# Patient Record
Sex: Female | Born: 1975
Health system: Southern US, Community
[De-identification: ages and names within clinical notes are randomized; demographics above are authoritative.]

## PROBLEM LIST (undated history)

## (undated) DIAGNOSIS — D219 Benign neoplasm of connective and other soft tissue, unspecified: Secondary | ICD-10-CM

## (undated) DIAGNOSIS — N809 Endometriosis, unspecified: Secondary | ICD-10-CM

## (undated) DIAGNOSIS — Z8616 Personal history of COVID-19: Secondary | ICD-10-CM

## (undated) DIAGNOSIS — D869 Sarcoidosis, unspecified: Secondary | ICD-10-CM

## (undated) DIAGNOSIS — E785 Hyperlipidemia, unspecified: Secondary | ICD-10-CM

## (undated) DIAGNOSIS — D509 Iron deficiency anemia, unspecified: Secondary | ICD-10-CM

## (undated) HISTORY — DX: Personal history of COVID-19: Z86.16

## (undated) HISTORY — DX: Hyperlipidemia, unspecified: E78.5

## (undated) HISTORY — DX: Endometriosis, unspecified: N80.9

## (undated) HISTORY — DX: Benign neoplasm of connective and other soft tissue, unspecified: D21.9

## (undated) HISTORY — PX: TUBAL LIGATION: SHX77

---

## 2001-07-30 HISTORY — PX: LAPAROTOMY: SHX154

## 2005-05-03 ENCOUNTER — Emergency Department (HOSPITAL_COMMUNITY): Admission: EM | Admit: 2005-05-03 | Discharge: 2005-05-03 | Payer: Self-pay | Admitting: Family Medicine

## 2006-04-08 ENCOUNTER — Emergency Department (HOSPITAL_COMMUNITY): Admission: EM | Admit: 2006-04-08 | Discharge: 2006-04-08 | Payer: Self-pay | Admitting: Emergency Medicine

## 2006-06-06 ENCOUNTER — Emergency Department (HOSPITAL_COMMUNITY): Admission: EM | Admit: 2006-06-06 | Discharge: 2006-06-06 | Payer: Self-pay | Admitting: Emergency Medicine

## 2006-08-14 ENCOUNTER — Ambulatory Visit (HOSPITAL_COMMUNITY): Admission: RE | Admit: 2006-08-14 | Discharge: 2006-08-14 | Payer: Self-pay | Admitting: Obstetrics & Gynecology

## 2008-02-10 ENCOUNTER — Ambulatory Visit (HOSPITAL_COMMUNITY): Admission: RE | Admit: 2008-02-10 | Discharge: 2008-02-10 | Payer: Self-pay | Admitting: Obstetrics & Gynecology

## 2008-11-30 ENCOUNTER — Emergency Department (HOSPITAL_COMMUNITY): Admission: EM | Admit: 2008-11-30 | Discharge: 2008-11-30 | Payer: Self-pay | Admitting: Emergency Medicine

## 2008-12-29 ENCOUNTER — Emergency Department (HOSPITAL_COMMUNITY): Admission: EM | Admit: 2008-12-29 | Discharge: 2008-12-29 | Payer: Self-pay | Admitting: Emergency Medicine

## 2009-05-26 ENCOUNTER — Emergency Department (HOSPITAL_COMMUNITY): Admission: EM | Admit: 2009-05-26 | Discharge: 2009-05-26 | Payer: Self-pay | Admitting: Emergency Medicine

## 2009-08-18 ENCOUNTER — Emergency Department (HOSPITAL_COMMUNITY): Admission: EM | Admit: 2009-08-18 | Discharge: 2009-08-18 | Payer: Self-pay | Admitting: Emergency Medicine

## 2009-08-31 ENCOUNTER — Emergency Department (HOSPITAL_COMMUNITY): Admission: EM | Admit: 2009-08-31 | Discharge: 2009-08-31 | Payer: Self-pay | Admitting: Emergency Medicine

## 2009-09-14 ENCOUNTER — Emergency Department (HOSPITAL_COMMUNITY): Admission: EM | Admit: 2009-09-14 | Discharge: 2009-09-14 | Payer: Self-pay | Admitting: Emergency Medicine

## 2009-09-20 ENCOUNTER — Emergency Department (HOSPITAL_COMMUNITY): Admission: EM | Admit: 2009-09-20 | Discharge: 2009-09-20 | Payer: Self-pay | Admitting: Emergency Medicine

## 2010-08-20 ENCOUNTER — Encounter: Payer: Self-pay | Admitting: Obstetrics & Gynecology

## 2010-10-18 LAB — POCT I-STAT, CHEM 8
BUN: 13 mg/dL (ref 6–23)
Calcium, Ion: 1.07 mmol/L — ABNORMAL LOW (ref 1.12–1.32)
Chloride: 106 meq/L (ref 96–112)
Creatinine, Ser: 0.8 mg/dL (ref 0.4–1.2)
Glucose, Bld: 106 mg/dL — ABNORMAL HIGH (ref 70–99)
HCT: 36 % (ref 36.0–46.0)
Hemoglobin: 12.2 g/dL (ref 12.0–15.0)
Potassium: 3.5 meq/L (ref 3.5–5.1)
Sodium: 137 meq/L (ref 135–145)
TCO2: 26 mmol/L (ref 0–100)

## 2010-11-06 LAB — DIFFERENTIAL
Basophils Absolute: 0 10*3/uL (ref 0.0–0.1)
Basophils Relative: 1 % (ref 0–1)
Eosinophils Absolute: 0 10*3/uL (ref 0.0–0.7)
Monocytes Relative: 4 % (ref 3–12)
Neutro Abs: 5.4 10*3/uL (ref 1.7–7.7)

## 2010-11-06 LAB — WET PREP, GENITAL
Trich, Wet Prep: NONE SEEN
Yeast Wet Prep HPF POC: NONE SEEN

## 2010-11-06 LAB — BASIC METABOLIC PANEL
BUN: 11 mg/dL (ref 6–23)
CO2: 26 mEq/L (ref 19–32)
GFR calc Af Amer: >60 mL/min (ref >60)
GFR calc non Af Amer: >60 mL/min (ref >60)
Sodium: 139 mEq/L (ref 135–145)

## 2010-11-06 LAB — URINALYSIS, ROUTINE W REFLEX MICROSCOPIC
Nitrite: NEGATIVE
Specific Gravity, Urine: 1.025 (ref 1.005–1.030)
Urobilinogen, UA: 1 mg/dL (ref 0.0–1.0)

## 2010-11-06 LAB — CBC
Hemoglobin: 12.2 g/dL (ref 12.0–15.0)
MCV: 83.8 fL (ref 78.0–100.0)
RBC: 4.47 MIL/uL (ref 3.87–5.11)
WBC: 7.5 10*3/uL (ref 4.0–10.5)

## 2010-11-06 LAB — POCT PREGNANCY, URINE: Preg Test, Ur: NEGATIVE

## 2010-11-07 LAB — URINALYSIS, ROUTINE W REFLEX MICROSCOPIC
Bilirubin Urine: NEGATIVE
Ketones, ur: NEGATIVE mg/dL
Nitrite: NEGATIVE
Protein, ur: NEGATIVE mg/dL
Urobilinogen, UA: 1 mg/dL (ref 0.0–1.0)
pH: 7.5 (ref 5.0–8.0)

## 2010-11-07 LAB — BASIC METABOLIC PANEL
BUN: 10 mg/dL (ref 6–23)
Chloride: 109 mEq/L (ref 96–112)
Glucose, Bld: 101 mg/dL — ABNORMAL HIGH (ref 70–99)
Potassium: 3.7 mEq/L (ref 3.5–5.1)
Sodium: 138 mEq/L (ref 135–145)

## 2010-11-07 LAB — WET PREP, GENITAL
Clue Cells Wet Prep HPF POC: NONE SEEN
WBC, Wet Prep HPF POC: NONE SEEN
Yeast Wet Prep HPF POC: NONE SEEN

## 2010-11-07 LAB — CBC
HCT: 35 % — ABNORMAL LOW (ref 36.0–46.0)
Hemoglobin: 11.8 g/dL — ABNORMAL LOW (ref 12.0–15.0)
MCV: 84.3 fL (ref 78.0–100.0)
Platelets: 342 10*3/uL (ref 150–400)
WBC: 6 10*3/uL (ref 4.0–10.5)

## 2010-11-07 LAB — URINE MICROSCOPIC-ADD ON

## 2010-11-07 LAB — DIFFERENTIAL
Eosinophils Absolute: 0.4 10*3/uL (ref 0.0–0.7)
Eosinophils Relative: 6 % — ABNORMAL HIGH (ref 0–5)
Lymphs Abs: 1.7 10*3/uL (ref 0.7–4.0)
Monocytes Absolute: 0.2 10*3/uL (ref 0.1–1.0)

## 2010-11-07 LAB — POCT PREGNANCY, URINE: Preg Test, Ur: NEGATIVE

## 2010-11-07 LAB — GC/CHLAMYDIA PROBE AMP, GENITAL: GC Probe Amp, Genital: NEGATIVE

## 2011-03-08 ENCOUNTER — Emergency Department (HOSPITAL_COMMUNITY)
Admission: EM | Admit: 2011-03-08 | Discharge: 2011-03-09 | Disposition: A | Discharge status: Home / Self Care | Payer: Medicaid Other | Attending: Emergency Medicine | Admitting: Emergency Medicine

## 2011-03-08 DIAGNOSIS — R42 Dizziness and giddiness: Secondary | ICD-10-CM | POA: Insufficient documentation

## 2011-03-08 DIAGNOSIS — H9209 Otalgia, unspecified ear: Secondary | ICD-10-CM | POA: Insufficient documentation

## 2011-03-08 DIAGNOSIS — M25519 Pain in unspecified shoulder: Secondary | ICD-10-CM | POA: Insufficient documentation

## 2011-03-08 DIAGNOSIS — N390 Urinary tract infection, site not specified: Secondary | ICD-10-CM | POA: Insufficient documentation

## 2011-03-08 LAB — URINALYSIS, ROUTINE W REFLEX MICROSCOPIC
Glucose, UA: NEGATIVE mg/dL
Hgb urine dipstick: NEGATIVE
Specific Gravity, Urine: 1.015 (ref 1.005–1.030)
pH: 6.5 (ref 5.0–8.0)

## 2011-03-08 LAB — POCT I-STAT, CHEM 8
BUN: 13 mg/dL (ref 6–23)
Chloride: 102 mEq/L (ref 96–112)
Creatinine, Ser: 0.9 mg/dL (ref 0.50–1.10)
Glucose, Bld: 100 mg/dL — ABNORMAL HIGH (ref 70–99)
Potassium: 3.7 mEq/L (ref 3.5–5.1)

## 2011-03-08 LAB — URINE MICROSCOPIC-ADD ON

## 2011-03-10 LAB — URINE CULTURE

## 2011-03-28 ENCOUNTER — Other Ambulatory Visit: Payer: Self-pay | Admitting: Obstetrics & Gynecology

## 2011-03-28 DIAGNOSIS — Z79899 Other long term (current) drug therapy: Secondary | ICD-10-CM

## 2011-03-30 ENCOUNTER — Ambulatory Visit (HOSPITAL_COMMUNITY)
Admission: RE | Admit: 2011-03-30 | Discharge: 2011-03-30 | Disposition: A | Discharge status: Home / Self Care | Payer: Medicaid Other | Source: Ambulatory Visit | Attending: Obstetrics & Gynecology | Admitting: Obstetrics & Gynecology

## 2011-03-30 DIAGNOSIS — Z13820 Encounter for screening for osteoporosis: Secondary | ICD-10-CM | POA: Insufficient documentation

## 2011-03-30 DIAGNOSIS — Z79899 Other long term (current) drug therapy: Secondary | ICD-10-CM

## 2012-01-23 DIAGNOSIS — J189 Pneumonia, unspecified organism: Secondary | ICD-10-CM

## 2012-01-24 HISTORY — PX: NECK SURGERY: SHX720

## 2012-01-29 ENCOUNTER — Ambulatory Visit (INDEPENDENT_AMBULATORY_CARE_PROVIDER_SITE_OTHER): Payer: Self-pay | Admitting: Internal Medicine

## 2012-01-29 ENCOUNTER — Other Ambulatory Visit: Payer: Self-pay

## 2012-01-29 ENCOUNTER — Encounter: Payer: Self-pay | Admitting: Internal Medicine

## 2012-01-29 VITALS — BP 106/62 | HR 88 | Ht 65.0 in | Wt 119.6 lb

## 2012-01-29 DIAGNOSIS — R599 Enlarged lymph nodes, unspecified: Secondary | ICD-10-CM

## 2012-01-29 DIAGNOSIS — D869 Sarcoidosis, unspecified: Secondary | ICD-10-CM

## 2012-01-29 DIAGNOSIS — R590 Localized enlarged lymph nodes: Secondary | ICD-10-CM

## 2012-01-29 MED ORDER — PREDNISONE 20 MG PO TABS: ORAL_TABLET | ORAL | Status: DC

## 2012-01-29 NOTE — Progress Notes (Signed)
01/29/12- 35 yoF never smoker referred by Dr Cleone Slim for pulmonary evaluation with question of sarcoid.  In late June of 2013 she had onset of left flank pain for which she was evaluated at [facility]. CT scan of chest the 01/22/2012 showed focal airspace disease right upper lobe. Peri-bronchovascular nodular opacity was also seen at the left hilum and more confluent disease in the left lower lobe. There is widespread mediastinal adenopathy. Imaging of the abdomen demonstrated splenomegaly with miliary lesions in the liver and abdominal adenopathy. Differential diagnosis was between sarcoid and lymphoma. PPD skin test was negative. She was given a course of Cipro which is been completed. A right supraclavicular node biopsy was performed 01/23/2012 with pathology pending. She describes decreased pain in the left flank, itching in the eye, hair loss, 25 weight loss, mild sweating at night. Dry cough at the end of May and some swollen glands in her neck but not shortness of breath, fever, blood or purulent discharge. Past medical history has included endometriosis, pregnancy with 2 C-sections, tubal ligation and left oophorectomy. Elevated cholesterol. She denies drug history other than past use of marijuana. 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  Prior to Admission medications   Medication Sig Start Date End Date Taking? Authorizing Provider  ciprofloxacin (CIPRO) 250 MG tablet Take 250 mg by mouth 2 (two) times daily.   Yes Historical Provider, MD  predniSONE (DELTASONE) 20 MG tablet 3 daily x 1 week, then 2 daily or as directed 01/29/12 01/28/13  Waymon Budge, MD   Past Medical History  Diagnosis Date  . Hyperlipidemia   . Endometriosis    Past Surgical History  Procedure Date  . Gallbladder surgery 01-22-12  . Neck surgery 01-24-12    removed lymph node  . Tubal ligation 2003   Family History  Problem Relation Age of Onset  . Asthma Sister   . Asthma Brother   . Asthma  Father     childhood-smoker  . Diabetes Paternal Grandmother    History   Social History  . Marital Status: Single    Spouse Name: N/A    Number of Children: 2  . Years of Education: N/A   Occupational History  . Dental Assistant    Social History Main Topics  . Smoking status: Never Smoker   . Smokeless tobacco: Not on file  . Alcohol Use: No     Quit drinking 01-21-12  . Drug Use: No     Quit 07-19-11-"pot"  . Sexually Active: Not on file   Other Topics Concern  . Not on file   Social History Narrative  . No narrative on file   ROS-see HPI Constitutional:  +  weight loss,? night sweats,No- fevers, chills, fatigue, lassitude. HEENT:   No-  headaches, difficulty swallowing, tooth/dental problems, sore throat,       No-  sneezing, itching, ear ache, nasal congestion, post nasal drip,  CV:  No-   chest pain, orthopnea, PND, swelling in lower extremities, anasarca, dizziness, palpitations Resp: No-   shortness of breath with exertion or at rest.  + L flank pain            No-   productive cough,  + non-productive cough,  No- coughing up of blood.              No-   change in color of mucus.  No- wheezing.   Skin: No-   rash or lesions. GI:  No-  heartburn, indigestion, abdominal pain, nausea, vomiting, diarrhea,                 change in bowel habits, loss of appetite GU: No-   dysuria, change in color of urine, no urgency or frequency.  No- flank pain. MS:  No-   joint pain or swelling.  No- decreased range of motion.  No- back pain. Neuro-     nothing unusual Psych:  No- change in mood or affect. No depression or anxiety.  No memory loss.  OBJ- Physical Exam General- Alert, Oriented, Affect-appropriate, Distress- none acute. Slender, intelligent woman. Skin- rash-none, lesions- none, excoriation- none Lymphadenopathy- none Head- atraumatic            Eyes- Gross vision intact, PERRLA, conjunctivae and secretions clear            Ears- Hearing, canals-normal             Nose- Clear, no-Septal dev, mucus, polyps, erosion, perforation             Throat- Mallampati II , mucosa clear , drainage- none, tonsils- atrophic. Torus Neck- flexible , trachea midline, no stridor , thyroid nl, carotid no bruit Chest - symmetrical excursion , unlabored           Heart/CV- RRR , no murmur , no gallop  , no rub, nl s1 s2                           - JVD- none , edema- none, stasis changes- none, varices- none           Lung- clear to P&A, wheeze- none, cough- none , dullness-none, rub- none           Chest wall-  Abd- tender-no, distended-no, bowel sounds-present, HSM- not appreciated sitting upright for exam. Br/ Gen/ Rectal- Not done, not indicated Extrem- cyanosis- none, clubbing, none, atrophy- none, strength- nl Neuro- grossly intact to observation

## 2012-01-29 NOTE — Patient Instructions (Addendum)
Record release- [facility] 1) need radiology image disks for 01/22/12- CT chest/ abd/pelvis                                             2) pathology report for lymph node 01/23/12 (approx)                                             3) any incidental labs recently- chemistry, CBC  Order- ACE level- dx sarcoid  Script prednisone 20 mg    You will take 3 daily x 1 week, then 2 daily x 1 month or as directed

## 2012-01-30 LAB — ANGIOTENSIN CONVERTING ENZYME: Angiotensin-Converting Enzyme: >100 U/L — ABNORMAL HIGH (ref 8–52)

## 2012-01-30 NOTE — Progress Notes (Signed)
Quick Note:  Pt is aware of results and will start prednisone. ______

## 2012-02-01 ENCOUNTER — Encounter: Payer: Self-pay | Admitting: Internal Medicine

## 2012-02-01 DIAGNOSIS — D869 Sarcoidosis, unspecified: Secondary | ICD-10-CM | POA: Insufficient documentation

## 2012-02-01 NOTE — Assessment & Plan Note (Signed)
Her demographic category and absence of toxic systemic symptoms favor sarcoid. Her supraclavicular node biopsy should be definitive, when that result is available. We have discussed sarcoid in general terms. Plan-ACE level, liver and kidney functions. She is given standby prescription for prednisone tapering from 60 mg daily to 40 mg daily. We will advise whether to start this based on results of ACE level and pending availability of her pathology report. Steroid talk done.

## 2012-03-10 ENCOUNTER — Encounter: Payer: Self-pay | Admitting: Internal Medicine

## 2012-03-10 ENCOUNTER — Other Ambulatory Visit (INDEPENDENT_AMBULATORY_CARE_PROVIDER_SITE_OTHER): Payer: Medicaid Other

## 2012-03-10 ENCOUNTER — Ambulatory Visit (INDEPENDENT_AMBULATORY_CARE_PROVIDER_SITE_OTHER)
Admission: RE | Admit: 2012-03-10 | Discharge: 2012-03-10 | Disposition: A | Discharge status: Home / Self Care | Payer: Medicaid Other | Source: Ambulatory Visit | Attending: Internal Medicine | Admitting: Internal Medicine

## 2012-03-10 ENCOUNTER — Ambulatory Visit (INDEPENDENT_AMBULATORY_CARE_PROVIDER_SITE_OTHER): Payer: Medicaid Other | Admitting: Internal Medicine

## 2012-03-10 VITALS — BP 122/72 | HR 95 | Ht 65.0 in | Wt 135.6 lb

## 2012-03-10 DIAGNOSIS — D869 Sarcoidosis, unspecified: Secondary | ICD-10-CM

## 2012-03-10 LAB — BASIC METABOLIC PANEL
CO2: 31 mEq/L (ref 19–32)
Chloride: 101 mEq/L (ref 96–112)
Creatinine, Ser: 1 mg/dL (ref 0.4–1.2)
GFR: 77.73 mL/min (ref >60.00)
Sodium: 138 mEq/L (ref 135–145)

## 2012-03-10 MED ORDER — PREDNISONE 10 MG PO TABS: ORAL_TABLET | ORAL | Status: DC

## 2012-03-10 NOTE — Progress Notes (Signed)
01/29/12- 35 yoF never smoker referred by Dr Cleone Slim for pulmonary evaluation with question of sarcoid.  In late June of 2013 she had onset of left flank pain for which she was evaluated at [facility]. CT scan of chest the 01/22/2012 showed focal airspace disease right upper lobe. Peri-bronchovascular nodular opacity was also seen at the left hilum and more confluent disease in the left lower lobe. There is widespread mediastinal adenopathy. Imaging of the abdomen demonstrated splenomegaly with miliary lesions in the liver and abdominal adenopathy. Differential diagnosis was between sarcoid and lymphoma. PPD skin test was negative. She was given a course of Cipro which is been completed. A right supraclavicular node biopsy was performed 01/23/2012 with pathology pending. She describes decreased pain in the left flank, itching in the eye, hair loss, 25 weight loss, mild sweating at night. Dry cough at the end of May and some swollen glands in her neck but not shortness of breath, fever, blood or purulent discharge. Past medical history has included endometriosis, pregnancy with 2 C-sections, tubal ligation and left oophorectomy. Elevated cholesterol. She denies drug history other than past use of marijuana. 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  03/10/12- 35 yoF followed for sarcoid Has had break outs on face and hair falling out since being on prednisone-also only getting aproximately 2-3 hours sleep at night-depression . Started prednisone at last visit 60 mg daily x7 days then 40 mg daily . We discussed steroid goals and side effects again. ACE >100 01/30/12 Biopsy right cervical node 01/23/2012/[facility]- none necrotizing granulomatous inflammation negative for organisms.  ROS-see HPI Constitutional:  +  weight loss,? night sweats,No- fevers, chills, fatigue, lassitude. HEENT:   No-  headaches, difficulty swallowing, tooth/dental problems, sore throat,       No-  sneezing,  itching, ear ache, nasal congestion, post nasal drip,  CV:  No-   chest pain, orthopnea, PND, swelling in lower extremities, anasarca, dizziness, palpitations Resp: No-   shortness of breath with exertion or at rest.            No-   productive cough,  + less non-productive cough,  No- coughing up of blood.              No-   change in color of mucus.  No- wheezing.   Skin: No-   rash or lesions. GI:  No-   heartburn, indigestion, abdominal pain, nausea, vomiting,  GU:  MS:  No-   joint pain or swelling. . Neuro-     nothing unusual Psych:  +change in mood or affect. + depression or anxiety.  No memory loss.  OBJ- Physical Exam General- Alert, Oriented, Affect-appropriate, Distress- none acute. Slender, intelligent woman. Skin- facial acne Lymphadenopathy- none Head- atraumatic            Eyes- Gross vision intact, PERRLA, conjunctivae and secretions clear            Ears- Hearing, canals-normal            Nose- Clear, no-Septal dev, mucus, polyps, erosion, perforation             Throat- Mallampati II , mucosa clear , drainage- none, tonsils- atrophic. Torus Neck- flexible , trachea midline, no stridor , thyroid nl, carotid no bruit Chest - symmetrical excursion , unlabored           Heart/CV- RRR , no murmur , no gallop  , no rub, nl s1 s2                           -  JVD- none , edema- none, stasis changes- none, varices- none           Lung- clear to P&A, wheeze- none, cough- none , dullness-none, rub- none           Chest wall-  Abd-  Br/ Gen/ Rectal- Not done, not indicated Extrem- cyanosis- none, clubbing, none, atrophy- none, strength- nl Neuro- grossly intact to observation     

## 2012-03-10 NOTE — Patient Instructions (Addendum)
Order- lab- ACE level, BMET    Dx Sarcoid              CXR- dx sarcoid  Please call as needed

## 2012-03-11 LAB — ANGIOTENSIN CONVERTING ENZYME: Angiotensin-Converting Enzyme: 28 U/L (ref 8–52)

## 2012-03-11 NOTE — Progress Notes (Signed)
Quick Note:  LMTCB ______ 

## 2012-03-13 ENCOUNTER — Telehealth: Payer: Self-pay | Admitting: Internal Medicine

## 2012-03-13 NOTE — Telephone Encounter (Signed)
Notes Recorded by Waymon Budge, MD on 03/10/2012 at 8:57 PM Lymph nodes and lung tissue changes consistent with known sarcoid. Mildly improved. Notes Recorded by Waymon Budge, MD on 03/11/2012 at 12:07 PM ACE level normal- much improved after having been on the prednisone. Plan to continue with low dose prednisone as discussed at ov.  [id number] to discuss results as above. Carron Curie, CMA

## 2012-03-14 NOTE — Telephone Encounter (Signed)
LMTCBx2. Chellsea Beckers, CMA  

## 2012-03-15 NOTE — Assessment & Plan Note (Addendum)
Stage II sarcoid with mediastinal and abdominal adenopathy, some parenchymal involvement. She has put up with the predicted steroid side effects. Hopefully we can keep her stable and substantially lower dose. We had another discussion of long-term steroid side effects especially involving bone, adrenal axis and resistance to infection Plan-reduce steroids to 10 mg daily

## 2012-03-17 NOTE — Telephone Encounter (Signed)
Pt is aware of results already; please see results in EPIC.

## 2012-03-24 ENCOUNTER — Telehealth: Payer: Self-pay | Admitting: Internal Medicine

## 2012-03-24 NOTE — Telephone Encounter (Signed)
Pt states she never received lab and cxr results so I reviewed them with her again according to result notes. Pt states understanding.Sara Nelson, CMA

## 2012-04-28 ENCOUNTER — Ambulatory Visit: Payer: Medicaid Other | Admitting: Internal Medicine

## 2012-05-05 ENCOUNTER — Telehealth: Payer: Self-pay | Admitting: Internal Medicine

## 2012-05-05 MED ORDER — PREDNISONE 5 MG PO TABS: 5.0000 mg | ORAL_TABLET | Freq: Every day | ORAL | Status: DC

## 2012-05-05 NOTE — Telephone Encounter (Signed)
Called, spoke with pt.  We have scheduled her to see Dr. Maple Hudson on 05/19/12 at 11:15 am.  Pt aware and will call back if anything is needed prior to this or seek emergency care if needed.

## 2012-05-05 NOTE — Telephone Encounter (Signed)
1) spoke with pt and she stated she had missed her last appt but rescheduled to 06/23/12 w/ Dr. Maple Hudson. She is wanting to know if she still needs to be on prednisone--if so will need refill.   2) she has been getting "mesquito" like bumps all over her body x 2-3 weeks now. She knows these are not mesquito bites bc she has not been outside. They are very itchy/red. They are not painful. She only had 1 of these bumps that had pus in it a couple weeks ago but then went away after she squeezed the pus out. She has not tried anything OTC. She is requesting recs. Please advise thanks  Allergies  Allergen Reactions  . Ceftin (Cefuroxime Axetil) Shortness Of Breath

## 2012-05-05 NOTE — Telephone Encounter (Signed)
Recommend prednisone 5 mg daily till next ov.  Order prednisone 5 mg, # 30, ref x 1 Treat skin rash as acne- soap, water. If it stays in one patch, like shingles, I would like earlier ov so I can see it.

## 2012-05-05 NOTE — Telephone Encounter (Signed)
Called, spoke with pt.  Informed her of below per Dr. Maple Hudson.  She verbalized understanding and is aware prednisone 5 mg qd rx sent to [facility].  Dr. Maple Hudson, pt states she is already washing the rash bid with soap and water.  She was using dial soap.  Has now switched to Lever.  She is reqeusting an OV before Nov.  She can only come on Mondays.  Pls advise.  Thank you.

## 2012-05-05 NOTE — Telephone Encounter (Signed)
05-19-12 at 11:15am with CY; soonest available as pt can only come in on Mondays.

## 2012-05-19 ENCOUNTER — Ambulatory Visit (INDEPENDENT_AMBULATORY_CARE_PROVIDER_SITE_OTHER): Payer: Medicaid Other | Admitting: Internal Medicine

## 2012-05-19 ENCOUNTER — Other Ambulatory Visit (INDEPENDENT_AMBULATORY_CARE_PROVIDER_SITE_OTHER): Payer: Medicaid Other

## 2012-05-19 ENCOUNTER — Encounter: Payer: Self-pay | Admitting: Internal Medicine

## 2012-05-19 VITALS — BP 110/72 | HR 79 | Ht 65.0 in | Wt 148.0 lb

## 2012-05-19 DIAGNOSIS — D869 Sarcoidosis, unspecified: Secondary | ICD-10-CM

## 2012-05-19 DIAGNOSIS — R21 Rash and other nonspecific skin eruption: Secondary | ICD-10-CM

## 2012-05-19 DIAGNOSIS — G4700 Insomnia, unspecified: Secondary | ICD-10-CM

## 2012-05-19 LAB — BASIC METABOLIC PANEL
BUN: 16 mg/dL (ref 6–23)
Chloride: 108 mEq/L (ref 96–112)
Creatinine, Ser: 0.9 mg/dL (ref 0.4–1.2)

## 2012-05-19 MED ORDER — FLUCONAZOLE 150 MG PO TABS: 150.0000 mg | ORAL_TABLET | Freq: Once | ORAL | Status: DC

## 2012-05-19 MED ORDER — ZALEPLON 5 MG PO CAPS: ORAL_CAPSULE | ORAL | Status: DC

## 2012-05-19 NOTE — Patient Instructions (Addendum)
Script for sleep med  Script for diflucan tabs for yeast  Order- lab ACE level , BMET dx sarcoid  Consider trying otc caffeine caplets, like NoDoz- store brand is fine.   Consider otc

## 2012-05-19 NOTE — Progress Notes (Signed)
01/29/12- 35 yoF never smoker referred by Dr Cleone Slim for pulmonary evaluation with question of sarcoid.  In late June of 2013 she had onset of left flank pain for which she was evaluated at [facility]. CT scan of chest the 01/22/2012 showed focal airspace disease right upper lobe. Peri-bronchovascular nodular opacity was also seen at the left hilum and more confluent disease in the left lower lobe. There is widespread mediastinal adenopathy. Imaging of the abdomen demonstrated splenomegaly with miliary lesions in the liver and abdominal adenopathy. Differential diagnosis was between sarcoid and lymphoma. PPD skin test was negative. She was given a course of Cipro which is been completed. A right supraclavicular node biopsy was performed 01/23/2012 with pathology pending. She describes decreased pain in the left flank, itching in the eye, hair loss, 25 weight loss, mild sweating at night. Dry cough at the end of May and some swollen glands in her neck but not shortness of breath, fever, blood or purulent discharge. Past medical history has included endometriosis, pregnancy with 2 C-sections, tubal ligation and left oophorectomy. Elevated cholesterol. She denies drug history other than past use of marijuana. 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  03/10/12- 35 yoF followed for sarcoid Has had break outs on face and hair falling out since being on prednisone-also only getting aproximately 2-3 hours sleep at night-depression . Started prednisone at last visit 60 mg daily x7 days then 40 mg daily . We discussed steroid goals and side effects again. ACE >100 01/30/12 Biopsy right cervical node 01/23/2012/[facility]- none necrotizing granulomatous inflammation negative for organisms.  05/19/12- 35 yoF never smoker followed for sarcoid Continues prednisone 5 mg daily. Over the past month is noting small pruritic red spots which fade to brown- on extremities and trunk. Denies fever, sweats,  cough, swollen glands. Nose runs. Feels sleepy but has difficulty falling asleep. Discussed sleep hygiene. CXR 03/10/12-  IMPRESSION:  Right paratracheal and hilar adenopathy bilaterally. These  findings are compatible with sarcoid.  Airspace disease is present compatible with sarcoid. Mild  improvement on the right.  Original Report Authenticated By: Camelia Phenes, M.D.  ACE 28- 03/10/12  ROS-see HPI Constitutional:  +  weight loss,? night sweats,No- fevers, chills, fatigue, lassitude. HEENT:   No-  headaches, difficulty swallowing, tooth/dental problems, sore throat,       No-  sneezing, itching, ear ache, nasal congestion, post nasal drip,  CV:  No-   chest pain, orthopnea, PND, swelling in lower extremities, anasarca, dizziness, palpitations Resp: No-   shortness of breath with exertion or at rest.            No-   productive cough,  + less non-productive cough,  No- coughing up of blood.              No-   change in color of mucus.  No- wheezing.   Skin: + rash GI:  No-   heartburn, indigestion, abdominal pain, nausea, vomiting,  GU:  MS:  No-   joint pain or swelling. . Neuro-     nothing unusual Psych:  +change in mood or affect. + depression or anxiety.  No memory loss.  OBJ- Physical Exam General- Alert, Oriented, Affect-appropriate, Distress- none acute. Slender, intelligent woman. Skin- + small brown hyperpigmented nonraised spots Lymphadenopathy- none Head- atraumatic            Eyes- Gross vision intact, PERRLA, conjunctivae and secretions clear  Ears- Hearing, canals-normal            Nose- Clear, no-Septal dev, mucus, polyps, erosion, perforation             Throat- Mallampati II , mucosa clear , drainage- none, tonsils- atrophic. Torus Neck- flexible , trachea midline, no stridor , thyroid nl, carotid no bruit Chest - symmetrical excursion , unlabored           Heart/CV- RRR , no murmur , no gallop  , no rub, nl s1 s2                           - JVD- none  , edema- none, stasis changes- none, varices- none           Lung- clear to P&A, wheeze- none, cough- none , dullness-none, rub- none           Chest wall-  Abd-  Br/ Gen/ Rectal- Not done, not indicated Extrem- cyanosis- none, clubbing, none, atrophy- none, strength- nl Neuro- grossly intact to observation

## 2012-05-20 LAB — ANGIOTENSIN CONVERTING ENZYME: Angiotensin-Converting Enzyme: 67 U/L — ABNORMAL HIGH (ref 8–52)

## 2012-05-22 NOTE — Progress Notes (Signed)
Quick Note:  LMTCB ______ 

## 2012-05-27 ENCOUNTER — Encounter: Payer: Self-pay | Admitting: Internal Medicine

## 2012-05-27 DIAGNOSIS — G4700 Insomnia, unspecified: Secondary | ICD-10-CM | POA: Insufficient documentation

## 2012-05-27 DIAGNOSIS — L500 Allergic urticaria: Secondary | ICD-10-CM | POA: Insufficient documentation

## 2012-05-27 NOTE — Assessment & Plan Note (Signed)
Plan-update ACE level

## 2012-05-27 NOTE — Assessment & Plan Note (Addendum)
Plan-cortisone over-the-counter skin cream. Trial of Diflucan for yeast against the possibility that this is a Monilia from her steroids.

## 2012-05-27 NOTE — Assessment & Plan Note (Addendum)
Plan-Sonata, basic sleep hygiene discussion Okay to use occasional caffeine tablet in the daytime if needed to help regulate sleep rhythm.

## 2012-06-13 ENCOUNTER — Telehealth: Payer: Self-pay | Admitting: Internal Medicine

## 2012-06-13 MED ORDER — ZALEPLON 5 MG PO CAPS: ORAL_CAPSULE | ORAL | Status: DC

## 2012-06-13 NOTE — Telephone Encounter (Signed)
Called and lmom for the  pt  To make her  aware that this rx has been called to the pharmacy.

## 2012-06-13 NOTE — Telephone Encounter (Signed)
Per CY-okay to refill Soanta 5 mg #30 take 1-2 po qhs prn sleep with 1 refill.

## 2012-06-13 NOTE — Telephone Encounter (Signed)
I called the pt to speak with her regarding recent labs She verbalized understanding of these She states that she is needing a refill on sonata The rx that we gave her on 05/19/12 she lost Please advise if okay to send in rx- sonata 5 mg # 30 1-2 qhs with 1 rf

## 2012-06-13 NOTE — Progress Notes (Signed)
Quick Note:  Spoke with pt and notified of results per Dr. Young. Pt verbalized understanding and denied any questions.  ______ 

## 2012-06-23 ENCOUNTER — Ambulatory Visit: Payer: Medicaid Other | Admitting: Internal Medicine

## 2012-06-30 ENCOUNTER — Telehealth: Payer: Self-pay | Admitting: Internal Medicine

## 2012-06-30 MED ORDER — PREDNISONE 10 MG PO TABS: ORAL_TABLET | ORAL | Status: DC

## 2012-06-30 NOTE — Telephone Encounter (Signed)
Per CY-okay prednisone 20 mg qd x 1 week then alternate 10 mg with 20 mg every other day Ok to rx Prednisone 10 mg #100 2 daily or as directed no refills. Pt is aware of Rx sent to pharmacy.

## 2012-06-30 NOTE — Telephone Encounter (Signed)
Spoke with patient, patient states that x2weeks she has doubled her prednisone dose from 10mg  to 20mg  because she "could feel her spleen" and felt like she was starting to have another flare up.  Patient states that since doubling her prednisone she has felt much better.  Patient wants to know if it is ok for her to stay at 20mg  daily and if so she would need a refill for this, or if not what does Dr. Maple Hudson reccommend. Dr. Maple Hudson please advise, thank you!  Last OV:05/19/12 Next OV:08/11/12

## 2012-07-16 ENCOUNTER — Emergency Department (HOSPITAL_COMMUNITY): Payer: No Typology Code available for payment source

## 2012-07-16 ENCOUNTER — Encounter (HOSPITAL_COMMUNITY): Payer: Self-pay | Admitting: Emergency Medicine

## 2012-07-16 ENCOUNTER — Emergency Department (HOSPITAL_COMMUNITY)
Admission: EM | Admit: 2012-07-16 | Discharge: 2012-07-16 | Disposition: A | Discharge status: Home / Self Care | Payer: No Typology Code available for payment source | Attending: Emergency Medicine | Admitting: Emergency Medicine

## 2012-07-16 DIAGNOSIS — M542 Cervicalgia: Secondary | ICD-10-CM

## 2012-07-16 DIAGNOSIS — R51 Headache: Secondary | ICD-10-CM | POA: Insufficient documentation

## 2012-07-16 DIAGNOSIS — E785 Hyperlipidemia, unspecified: Secondary | ICD-10-CM | POA: Insufficient documentation

## 2012-07-16 DIAGNOSIS — Z8742 Personal history of other diseases of the female genital tract: Secondary | ICD-10-CM | POA: Insufficient documentation

## 2012-07-16 DIAGNOSIS — IMO0002 Reserved for concepts with insufficient information to code with codable children: Secondary | ICD-10-CM | POA: Insufficient documentation

## 2012-07-16 DIAGNOSIS — S0990XA Unspecified injury of head, initial encounter: Secondary | ICD-10-CM | POA: Insufficient documentation

## 2012-07-16 DIAGNOSIS — Y9389 Activity, other specified: Secondary | ICD-10-CM | POA: Insufficient documentation

## 2012-07-16 DIAGNOSIS — R6884 Jaw pain: Secondary | ICD-10-CM | POA: Insufficient documentation

## 2012-07-16 DIAGNOSIS — Y92410 Unspecified street and highway as the place of occurrence of the external cause: Secondary | ICD-10-CM | POA: Insufficient documentation

## 2012-07-16 DIAGNOSIS — G44309 Post-traumatic headache, unspecified, not intractable: Secondary | ICD-10-CM

## 2012-07-16 HISTORY — DX: Sarcoidosis, unspecified: D86.9

## 2012-07-16 MED ORDER — MELOXICAM 15 MG PO TABS: 15.0000 mg | ORAL_TABLET | Freq: Every day | ORAL | Status: DC

## 2012-07-16 MED ORDER — METHOCARBAMOL 750 MG PO TABS: 750.0000 mg | ORAL_TABLET | Freq: Four times a day (QID) | ORAL | Status: DC

## 2012-07-16 MED ORDER — OXYCODONE-ACETAMINOPHEN 5-325 MG PO TABS: 2.0000 | ORAL_TABLET | ORAL | Status: DC | PRN
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
## 2012-07-16 NOTE — ED Notes (Signed)
Patient transported to X-ray 

## 2012-07-16 NOTE — ED Notes (Signed)
Pt not in room.

## 2012-07-16 NOTE — ED Notes (Signed)
Pt returned to room from radiology. Triage initiated at this time. Triage completed at bedside. Pt on telephone with insurance company. Pt alert , oriented and conversive

## 2012-07-16 NOTE — ED Notes (Signed)
Patient transported to CT 

## 2012-07-16 NOTE — ED Notes (Signed)
Pt seen by PA. Orders received

## 2012-07-19 NOTE — ED Provider Notes (Signed)
Medical screening examination/treatment/procedure(s) were performed by non-physician practitioner and as supervising physician I was immediately available for consultation/collaboration.   Waver Dibiasio M Yashar Inclan, MD 07/19/12 0744 

## 2012-08-11 ENCOUNTER — Ambulatory Visit: Payer: Self-pay | Admitting: Internal Medicine

## 2012-09-02 ENCOUNTER — Encounter: Payer: Self-pay | Admitting: Internal Medicine

## 2012-09-02 ENCOUNTER — Ambulatory Visit (INDEPENDENT_AMBULATORY_CARE_PROVIDER_SITE_OTHER): Payer: Self-pay | Admitting: Internal Medicine

## 2012-09-02 VITALS — BP 122/80 | HR 89 | Ht 65.0 in | Wt 161.2 lb

## 2012-09-02 DIAGNOSIS — D869 Sarcoidosis, unspecified: Secondary | ICD-10-CM

## 2012-09-02 NOTE — Patient Instructions (Addendum)
Order- CXR    Dx sarcoid              Lab-    CBC, ACE, BMET, Liver panel  Ok to take daily Allegra for itching, and use soothing skin lotion  We will watch for now off prednisone to see what happens.
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
## 2012-09-11 NOTE — Assessment & Plan Note (Signed)
.   Side effects from prednisone with symptoms associated with sarcoid. We need to understand how active her sarcoid now is. Plan-chest x-ray, ACE, chemistry, CBC. Discussed Allegra as an antihistamine

## 2012-10-14 ENCOUNTER — Ambulatory Visit: Payer: Self-pay | Admitting: Internal Medicine

## 2012-10-14 ENCOUNTER — Telehealth: Payer: Self-pay | Admitting: Internal Medicine

## 2012-10-14 NOTE — Telephone Encounter (Signed)
ATC the pt, NA and mailbox was full so unable to leave a msg

## 2012-10-14 NOTE — Telephone Encounter (Signed)
We had suggested Sara Nelson- is she using it daily? Why did she cancel appointment for today?

## 2012-10-14 NOTE — Telephone Encounter (Signed)
I spoke with pt and she is r/s to come in 11/25/12. She stated her voice comes and goes at times, she is spitting up clear phlem, she bruises easy, waking up with crust all over her eyes, she has an itch all over her body. Pt had an appt today but had to cancel. Please advise Dr. Maple Hudson thanks  Last OV 08/2012.  Allergies  Allergen Reactions  . Ceftin (Cefuroxime Axetil) Shortness Of Breath

## 2012-10-16 NOTE — Telephone Encounter (Signed)
lmomtcb for the pt to call back  

## 2012-10-17 ENCOUNTER — Telehealth: Payer: Self-pay | Admitting: Internal Medicine

## 2012-10-17 NOTE — Telephone Encounter (Signed)
Call pt back on cell phone [phone] she will go back to work at 2:00 and will not answer her phone we need to understand this 

## 2012-10-17 NOTE — Telephone Encounter (Signed)
Call pt back on cell phone [phone] she will go back to work at 2:00 and will not answer her phone we need to understand this

## 2012-10-17 NOTE — Telephone Encounter (Signed)
Will try to call pt back after 5 pm today.

## 2012-10-17 NOTE — Telephone Encounter (Signed)
[id number] on only contact number in chart. Carron Curie, CMA

## 2012-10-17 NOTE — Telephone Encounter (Signed)
Will sign off of this message.  See other message from 3/18

## 2012-10-20 NOTE — Telephone Encounter (Signed)
ATC patient, no answer [email or web address] 

## 2012-10-21 NOTE — Telephone Encounter (Signed)
lmtcb x2 

## 2012-10-22 NOTE — Telephone Encounter (Signed)
lmomtcb  

## 2012-10-23 NOTE — Telephone Encounter (Signed)
Spoke with pt and she is states that itching is some better but is still having bumps all over skin.  C/o hoarseness, dry cough and occas SOB.   Pt is taking allegra daily.    Appt for 11-25-12 was rescheduled to 10-29-11.

## 2012-10-28 ENCOUNTER — Encounter: Payer: Self-pay | Admitting: Internal Medicine

## 2012-10-28 ENCOUNTER — Ambulatory Visit (INDEPENDENT_AMBULATORY_CARE_PROVIDER_SITE_OTHER): Payer: BC Managed Care – PPO | Admitting: Internal Medicine

## 2012-10-28 ENCOUNTER — Other Ambulatory Visit (INDEPENDENT_AMBULATORY_CARE_PROVIDER_SITE_OTHER): Payer: BC Managed Care – PPO

## 2012-10-28 ENCOUNTER — Ambulatory Visit (INDEPENDENT_AMBULATORY_CARE_PROVIDER_SITE_OTHER)
Admission: RE | Admit: 2012-10-28 | Discharge: 2012-10-28 | Disposition: A | Discharge status: Home / Self Care | Payer: BC Managed Care – PPO | Source: Ambulatory Visit | Attending: Internal Medicine | Admitting: Internal Medicine

## 2012-10-28 VITALS — BP 110/62 | HR 98 | Temp 97.8°F | Ht 65.0 in | Wt 159.2 lb

## 2012-10-28 DIAGNOSIS — D869 Sarcoidosis, unspecified: Secondary | ICD-10-CM

## 2012-10-28 DIAGNOSIS — J00 Acute nasopharyngitis [common cold]: Secondary | ICD-10-CM

## 2012-10-28 DIAGNOSIS — J069 Acute upper respiratory infection, unspecified: Secondary | ICD-10-CM

## 2012-10-28 LAB — HEPATIC FUNCTION PANEL
ALT: 14 U/L (ref 0–35)
AST: 23 U/L (ref 0–37)
Albumin: 3.4 g/dL — ABNORMAL LOW (ref 3.5–5.2)
Total Bilirubin: 0.7 mg/dL (ref 0.3–1.2)

## 2012-10-28 LAB — CBC WITH DIFFERENTIAL/PLATELET
Basophils Absolute: 0 10*3/uL (ref 0.0–0.1)
Eosinophils Absolute: 0.1 10*3/uL (ref 0.0–0.7)
Lymphocytes Relative: 21.3 % (ref 12.0–46.0)
MCHC: 32.5 g/dL (ref 30.0–36.0)
MCV: 76.8 fl — ABNORMAL LOW (ref 78.0–100.0)
Monocytes Absolute: 0.4 10*3/uL (ref 0.1–1.0)
Neutrophils Relative %: 68.9 % (ref 43.0–77.0)
Platelets: 388 10*3/uL (ref 150.0–400.0)
RBC: 4.81 Mil/uL (ref 3.87–5.11)

## 2012-10-28 LAB — BASIC METABOLIC PANEL
BUN: 9 mg/dL (ref 6–23)
Chloride: 102 mEq/L (ref 96–112)
Creatinine, Ser: 0.9 mg/dL (ref 0.4–1.2)
GFR: 95.15 mL/min (ref >60.00)
Glucose, Bld: 101 mg/dL — ABNORMAL HIGH (ref 70–99)
Potassium: 3.9 mEq/L (ref 3.5–5.1)

## 2012-10-28 LAB — SEDIMENTATION RATE: Sed Rate: 47 mm/hr — ABNORMAL HIGH (ref 0–22)

## 2012-10-28 MED ORDER — DOXYCYCLINE HYCLATE 100 MG PO TABS: ORAL_TABLET | ORAL | Status: DC

## 2012-10-28 MED ORDER — TRAMADOL HCL 50 MG PO TABS: ORAL_TABLET | ORAL | Status: DC

## 2012-10-28 NOTE — Progress Notes (Signed)
Quick Note:  Pt aware of results and Rx sent to [facility] [address] ______

## 2012-10-28 NOTE — Patient Instructions (Addendum)
Order- CXR   Dx sarcoid              Lab- BMET, Liver panel, Sed rate, ANA, ACE level, CBC  Script sent for tramadol for cough and pain  For now treat the chest congestion as a viral chest cold- fluids, rest, comfort measures and time
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
## 2012-10-29 LAB — ANA: Anti Nuclear Antibody(ANA): NEGATIVE

## 2012-10-30 ENCOUNTER — Telehealth: Payer: Self-pay | Admitting: Internal Medicine

## 2012-10-30 DIAGNOSIS — R161 Splenomegaly, not elsewhere classified: Secondary | ICD-10-CM

## 2012-10-30 MED ORDER — PREDNISONE 10 MG PO TABS: 10.0000 mg | ORAL_TABLET | Freq: Every day | ORAL | Status: DC

## 2012-10-30 NOTE — Telephone Encounter (Signed)
Per CY-start back prednisone 10 mg #30 take 1 po qd with 1 refill and order KUB xray dx  Of "splenomegaly".

## 2012-10-30 NOTE — Telephone Encounter (Signed)
Called and spoke with pt about her lab results per CY.   Pt has a couple of questions--  1.  Should she start back on the prednisone since her ACE level is high?  2.  She stated that she is not able to lay on her left side--she stated that she needs her spleen checked because it feels like it did before when it was enlarged.    CY please advise. Thanks  Last ov--10/28/2012 Next ov--12/23/2012  Allergies  Allergen Reactions  . Ceftin (Cefuroxime Axetil) Shortness Of Breath

## 2012-10-30 NOTE — Telephone Encounter (Signed)
Pt aware of CDY recs. RX has been sent. Nothing further was needed

## 2012-11-04 DIAGNOSIS — J069 Acute upper respiratory infection, unspecified: Secondary | ICD-10-CM | POA: Insufficient documentation

## 2012-11-04 DIAGNOSIS — J00 Acute nasopharyngitis [common cold]: Secondary | ICD-10-CM | POA: Insufficient documentation

## 2012-11-04 NOTE — Assessment & Plan Note (Signed)
Sarcoid is clinically active. Plan-chest x-ray, chemistry panel, sedimentation rate, ANA, CBC, ACE level

## 2012-11-11 ENCOUNTER — Telehealth: Payer: Self-pay | Admitting: Internal Medicine

## 2012-11-11 NOTE — Telephone Encounter (Signed)
Spoke with pt and she states the only time she can be seen is Tuesday, 11-18-12.  PT given appt with Tammy Parrett on this date.

## 2012-11-11 NOTE — Telephone Encounter (Signed)
Per CY-we can see if we can add patient to TP or have her see her PCP. Thanks.

## 2012-11-11 NOTE — Telephone Encounter (Signed)
Called, spoke with pt.  States she finished doxy rx.  Since this, her symptoms are better but still there.  Reports she has a prod cough with yellow mucus, wheezing"here and there but not as much," upper back pain, a "continuous boom" HA, feels like someone is "squeezing" her head, feels hot, and has night sweats.  Denies increased SOB, chest tightness, CP, fever, or PND.  Also reports feeling dizzy, having N/D, and scratchy throat since yesterday.  States she doesn't have any diarrhea but her BMs have been "nasty" since finishing doxy.  She is requesting either OV with CDY or recs from him.    Last OV with CDy 10/28/12; asked to f/u in 1 month Pending O 12/23/12  Allergies  Allergen Reactions  . Ceftin (Cefuroxime Axetil) Shortness Of Breath

## 2012-11-18 ENCOUNTER — Ambulatory Visit: Payer: BC Managed Care – PPO | Admitting: Adult Health

## 2012-11-20 ENCOUNTER — Encounter: Payer: Self-pay | Admitting: Obstetrics & Gynecology

## 2012-11-20 ENCOUNTER — Ambulatory Visit (INDEPENDENT_AMBULATORY_CARE_PROVIDER_SITE_OTHER): Payer: BC Managed Care – PPO | Admitting: Obstetrics & Gynecology

## 2012-11-20 VITALS — BP 113/79 | HR 80 | Temp 98.2°F | Ht 65.0 in | Wt 155.0 lb

## 2012-11-20 DIAGNOSIS — Z01419 Encounter for gynecological examination (general) (routine) without abnormal findings: Secondary | ICD-10-CM

## 2012-11-20 DIAGNOSIS — N760 Acute vaginitis: Secondary | ICD-10-CM

## 2012-11-20 DIAGNOSIS — N803 Endometriosis of pelvic peritoneum, unspecified: Secondary | ICD-10-CM

## 2012-11-20 DIAGNOSIS — Z3202 Encounter for pregnancy test, result negative: Secondary | ICD-10-CM

## 2012-11-20 LAB — POCT URINE PREGNANCY: Preg Test, Ur: NEGATIVE

## 2012-11-20 MED ORDER — TINIDAZOLE 500 MG PO TABS: 500.0000 mg | ORAL_TABLET | Freq: Two times a day (BID) | ORAL | Status: DC

## 2012-11-20 NOTE — Patient Instructions (Signed)
Bacterial Vaginosis Bacterial vaginosis (BV) is a vaginal infection where the normal balance of bacteria in the vagina is disrupted. The normal balance is then replaced by an overgrowth of certain bacteria. There are several different kinds of bacteria that can cause BV. BV is the most common vaginal infection in women of childbearing age. CAUSES   The cause of BV is not fully understood. BV develops when there is an increase or imbalance of harmful bacteria.  Some activities or behaviors can upset the normal balance of bacteria in the vagina and put women at increased risk including:  Having a new sex partner or multiple sex partners.  Douching.  Using an intrauterine device (IUD) for contraception.  It is not clear what role sexual activity plays in the development of BV. However, women that have never had sexual intercourse are rarely infected with BV. Women do not get BV from toilet seats, bedding, swimming pools or from touching objects around them.  SYMPTOMS   Grey vaginal discharge.  A fish-like odor with discharge, especially after sexual intercourse.  Itching or burning of the vagina and vulva.  Burning or pain with urination.  Some women have no signs or symptoms at all. DIAGNOSIS  Your caregiver must examine the vagina for signs of BV. Your caregiver will perform lab tests and look at the sample of vaginal fluid through a microscope. They will look for bacteria and abnormal cells (clue cells), a pH test higher than 4.5, and a positive amine test all associated with BV.  RISKS AND COMPLICATIONS   Pelvic inflammatory disease (PID).  Infections following gynecology surgery.  Developing HIV.  Developing herpes virus. TREATMENT  Sometimes BV will clear up without treatment. However, all women with symptoms of BV should be treated to avoid complications, especially if gynecology surgery is planned. Female partners generally do not need to be treated. However, BV may spread  between female sex partners so treatment is helpful in preventing a recurrence of BV.   BV may be treated with antibiotics. The antibiotics come in either pill or vaginal cream forms. Either can be used with nonpregnant or pregnant women, but the recommended dosages differ. These antibiotics are not harmful to the baby.  BV can recur after treatment. If this happens, a second round of antibiotics will often be prescribed.  Treatment is important for pregnant women. If not treated, BV can cause a premature delivery, especially for a pregnant woman who had a premature birth in the past. All pregnant women who have symptoms of BV should be checked and treated.  For chronic reoccurrence of BV, treatment with a type of prescribed gel vaginally twice a week is helpful. HOME CARE INSTRUCTIONS   Finish all medication as directed by your caregiver.  Do not have sex until treatment is completed.  Tell your sexual partner that you have a vaginal infection. They should see their caregiver and be treated if they have problems, such as a mild rash or itching.  Practice safe sex. Use condoms. Only have 1 sex partner. PREVENTION  Basic prevention steps can help reduce the risk of upsetting the natural balance of bacteria in the vagina and developing BV:  Do not have sexual intercourse (be abstinent).  Do not douche.  Use all of the medicine prescribed for treatment of BV, even if the signs and symptoms go away.  Tell your sex partner if you have BV. That way, they can be treated, if needed, to prevent reoccurrence. SEEK MEDICAL CARE IF:     Your symptoms are not improving after 3 days of treatment.  You have increased discharge, pain, or fever. MAKE SURE YOU:   Understand these instructions.  Will watch your condition.  Will get help right away if you are not doing well or get worse. FOR MORE INFORMATION  Division of STD Prevention (DSTDP), Centers for Disease Control and Prevention:  www.cdc.gov/std American Social Health Association (ASHA): www.ashastd.org  Document Released: 07/16/2005 Document Revised: 10/08/2011 Document Reviewed: 01/06/2009 ExitCare Patient Information 2013 [employer].  

## 2012-11-20 NOTE — Progress Notes (Signed)
.   Subjective:     Sara Nelson is a 37 y.o. female here for her annual exam.  Current complaints:  Slight vaginal odor.  Patient would like to discuss starting the Lupron again for her endometriosis.  Personal health questionnaire reviewed: no.   Gynecologic History Patient's last menstrual period was 11/08/2012. Contraception: tubal ligation Last Pap: 12/2010. Results were: HSIL.  LEEP done 02/2011. Last mammogram: N/A     The following portions of the patient's history were reviewed and updated as appropriate: allergies, current medications, past family history, past medical history, past social history, past surgical history and problem list.  Review of Systems Pertinent items are noted in HPI.    Objective:    General appearance: alert Breasts: normal appearance, no masses or tenderness Abdomen: soft, non-tender; bowel sounds normal; no masses,  no organomegaly Pelvic: cervix normal in appearance, external genitalia normal, no adnexal masses or tenderness, uterus normal size, shape, and consistency and vagina with thin, white discharge    Assessment:    H/O endometriosis--now w/worsening symptoms; no medical management at this point  Plan:     Course of [place] Lupron-->"remission" Return to re-start therapy

## 2012-11-21 ENCOUNTER — Encounter: Payer: Self-pay | Admitting: Obstetrics & Gynecology

## 2012-11-21 DIAGNOSIS — N803 Endometriosis of pelvic peritoneum, unspecified: Secondary | ICD-10-CM | POA: Insufficient documentation

## 2012-11-21 LAB — POCT WET PREP (WET MOUNT)

## 2012-11-21 LAB — RPR

## 2012-11-21 LAB — HIV ANTIBODY (ROUTINE TESTING W REFLEX): HIV: NONREACTIVE

## 2012-11-25 ENCOUNTER — Ambulatory Visit: Payer: Self-pay | Admitting: Internal Medicine

## 2012-11-25 LAB — PAP IG, CT-NG, RFX HPV ASCU

## 2012-12-10 ENCOUNTER — Encounter: Payer: Self-pay | Admitting: Obstetrics & Gynecology

## 2012-12-23 ENCOUNTER — Ambulatory Visit (INDEPENDENT_AMBULATORY_CARE_PROVIDER_SITE_OTHER)
Admission: RE | Admit: 2012-12-23 | Discharge: 2012-12-23 | Disposition: A | Discharge status: Home / Self Care | Payer: BC Managed Care – PPO | Source: Ambulatory Visit | Attending: Internal Medicine | Admitting: Internal Medicine

## 2012-12-23 ENCOUNTER — Encounter: Payer: Self-pay | Admitting: Internal Medicine

## 2012-12-23 ENCOUNTER — Ambulatory Visit (INDEPENDENT_AMBULATORY_CARE_PROVIDER_SITE_OTHER): Payer: BC Managed Care – PPO | Admitting: Internal Medicine

## 2012-12-23 ENCOUNTER — Other Ambulatory Visit (INDEPENDENT_AMBULATORY_CARE_PROVIDER_SITE_OTHER): Payer: BC Managed Care – PPO

## 2012-12-23 VITALS — BP 112/60 | HR 108 | Ht 65.0 in | Wt 157.6 lb

## 2012-12-23 DIAGNOSIS — L500 Allergic urticaria: Secondary | ICD-10-CM

## 2012-12-23 DIAGNOSIS — D869 Sarcoidosis, unspecified: Secondary | ICD-10-CM

## 2012-12-23 LAB — HEPATIC FUNCTION PANEL
ALT: 12 U/L (ref 0–35)
AST: 19 U/L (ref 0–37)
Albumin: 3.6 g/dL (ref 3.5–5.2)
Alkaline Phosphatase: 64 U/L (ref 39–117)

## 2012-12-23 LAB — BASIC METABOLIC PANEL
Chloride: 102 mEq/L (ref 96–112)
GFR: 89.13 mL/min (ref >60.00)
Glucose, Bld: 90 mg/dL (ref 70–99)
Potassium: 4.1 mEq/L (ref 3.5–5.1)
Sodium: 135 mEq/L (ref 135–145)

## 2012-12-23 LAB — CBC WITH DIFFERENTIAL/PLATELET
Basophils Absolute: 0.1 10*3/uL (ref 0.0–0.1)
Eosinophils Absolute: 0.1 10*3/uL (ref 0.0–0.7)
HCT: 37.7 % (ref 36.0–46.0)
Hemoglobin: 12.5 g/dL (ref 12.0–15.0)
Lymphocytes Relative: 27.6 % (ref 12.0–46.0)
Lymphs Abs: 1.4 10*3/uL (ref 0.7–4.0)
MCHC: 33.2 g/dL (ref 30.0–36.0)
Neutro Abs: 3.2 10*3/uL (ref 1.4–7.7)
Platelets: 366 10*3/uL (ref 150.0–400.0)
RDW: 17.4 % — ABNORMAL HIGH (ref 11.5–14.6)

## 2012-12-23 NOTE — Patient Instructions (Addendum)
Order- CXR   Dx sarcoid             Lab- ACE level, CBC      We will decide what to do about the prednisone after we see the lab results  Restart Allegra/ fexofenadine
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
## 2012-12-26 NOTE — Progress Notes (Signed)
Quick Note:  PT AWARE OF RESULTS. ______ 

## 2013-01-03 NOTE — Assessment & Plan Note (Signed)
Plan-ACE level, chest x-ray, CBC

## 2013-01-03 NOTE — Assessment & Plan Note (Signed)
Explained urticaria as nonspecific. It can be associated with sarcoid. Plan-resume antihistamine.

## 2013-01-12 ENCOUNTER — Encounter: Payer: Self-pay | Admitting: Obstetrics

## 2013-02-17 ENCOUNTER — Ambulatory Visit: Payer: BC Managed Care – PPO | Admitting: Internal Medicine

## 2013-02-17 ENCOUNTER — Other Ambulatory Visit: Payer: Self-pay | Admitting: Obstetrics

## 2013-02-17 DIAGNOSIS — N390 Urinary tract infection, site not specified: Secondary | ICD-10-CM

## 2013-02-17 MED ORDER — NITROFURANTOIN MONOHYD MACRO 100 MG PO CAPS: 100.0000 mg | ORAL_CAPSULE | Freq: Two times a day (BID) | ORAL | Status: DC

## 2013-02-18 ENCOUNTER — Encounter: Payer: Self-pay | Admitting: Obstetrics

## 2013-02-20 ENCOUNTER — Encounter: Payer: Self-pay | Admitting: Internal Medicine

## 2013-04-01 ENCOUNTER — Other Ambulatory Visit: Payer: Self-pay | Admitting: *Deleted

## 2013-04-01 MED ORDER — METRONIDAZOLE 500 MG PO TABS: 500.0000 mg | ORAL_TABLET | Freq: Two times a day (BID) | ORAL | Status: DC

## 2013-04-01 MED ORDER — FLUCONAZOLE 150 MG PO TABS: 150.0000 mg | ORAL_TABLET | Freq: Once | ORAL | Status: DC

## 2013-08-04 ENCOUNTER — Telehealth: Payer: Self-pay | Admitting: Internal Medicine

## 2013-08-04 NOTE — Telephone Encounter (Signed)
Pt has not been seen since 12/23/12 told to f/u 6 weeks. She NS for her appt and has no pending appt.  I called and spoke with pt. She reports she takes prednisone 10 mg daily for her sarcoid. She reports she has lost her insurance and that is why she hasn't been in to see Dr. Annamaria Boots. She states she is having problems sleeping d/t prednisone. She is cranky bc she is not getting any sleep. She is asking if we can call something in for her to help her sleep. Dr. Annamaria Boots did prescribe for pt in the past sonata 5 mg 1-2 po at bedtime PRN. That was back in 2013. Please advise Dr. Annamaria Boots thanks  Allergies  Allergen Reactions  . Ceftin [Cefuroxime Axetil] Shortness Of Breath    Current Outpatient Prescriptions on File Prior to Visit  Medication Sig Dispense Refill  . Fexofenadine HCl (ALLEGRA PO) Take 2 tablets by mouth daily.      . fluconazole (DIFLUCAN) 150 MG tablet Take 1 tablet (150 mg total) by mouth once.  1 tablet  0  . metroNIDAZOLE (FLAGYL) 500 MG tablet Take 1 tablet (500 mg total) by mouth 2 (two) times daily.  14 tablet  0  . nitrofurantoin, macrocrystal-monohydrate, (MACROBID) 100 MG capsule Take 1 capsule (100 mg total) by mouth 2 (two) times daily.  14 capsule  0  . predniSONE (DELTASONE) 10 MG tablet Take 1 tablet (10 mg total) by mouth daily.  30 tablet  1  . zaleplon (SONATA) 5 MG capsule Take 10 mg by mouth at bedtime. 1-2 at bedtime for sleep if needed       No current facility-administered medications on file prior to visit.

## 2013-08-05 NOTE — Telephone Encounter (Signed)
lmomtcb x1 for pt 

## 2013-08-05 NOTE — Telephone Encounter (Signed)
Suggest an otc sleep med like Zzzquil or similar

## 2013-08-06 NOTE — Telephone Encounter (Signed)
LMTCBx2 for pt on home number. Corazon Bing, CMA

## 2013-08-07 NOTE — Telephone Encounter (Signed)
Pt is returning call can be reached on cell [phone] up to 2:00 PM & after 2:00 PM, please call @ work:  [facility] [phone].  Satira Anis

## 2013-08-07 NOTE — Telephone Encounter (Signed)
I called and spoke with pt. She reports when she last saw Dr. Annamaria Boots he had suggested this. She tried OTC medications but it made her feel awful the next day. Also it did not help with her sleep.  Please advise Dr. Annamaria Boots thanks

## 2013-08-07 NOTE — Telephone Encounter (Signed)
I am concerned about long-term management of her sarcoid. Prednisone was not meant to be a permanent treatment, and we would be hoping to taper her off. . She was due back in 6 weeks, but it has been 8 months. I am not going to prescribe additional meds. If she can't come here, she should see her pcp.

## 2013-08-07 NOTE — Telephone Encounter (Addendum)
Lmtcbx3 on home #, called cell/work # pt not there. Downs Bing, CMA

## 2013-08-07 NOTE — Telephone Encounter (Signed)
LMTCB

## 2013-08-10 NOTE — Telephone Encounter (Signed)
LMTCBx2. Jennifer Castillo, CMA  

## 2013-08-11 NOTE — Telephone Encounter (Signed)
Called pt at home and was told by family that she is at work.  Will await call back from pt.

## 2013-09-15 ENCOUNTER — Other Ambulatory Visit: Payer: Self-pay | Admitting: Internal Medicine

## 2013-09-16 ENCOUNTER — Telehealth: Payer: Self-pay | Admitting: Internal Medicine

## 2013-09-16 NOTE — Telephone Encounter (Signed)
CY, please advise if okay to refill. Thanks.  

## 2013-09-16 NOTE — Telephone Encounter (Signed)
Ok to refill, but ask her to try taking 1/2 tab daily and see how that goes. Leave directions on the bottle as 10 mg daily.

## 2013-09-16 NOTE — Telephone Encounter (Signed)
Spoke with patient- I have moved her appt with CY to Tuesday 09-29-13 at 11:15am.

## 2013-09-17 NOTE — Telephone Encounter (Signed)
Pt aware of recs and will try; OV scheduled for 10-06-13.

## 2013-09-29 ENCOUNTER — Ambulatory Visit: Payer: BC Managed Care – PPO | Admitting: Internal Medicine

## 2013-09-29 ENCOUNTER — Telehealth: Payer: Self-pay | Admitting: *Deleted

## 2013-09-29 MED ORDER — PREDNISONE 10 MG PO TABS: 10.0000 mg | ORAL_TABLET | Freq: Every day | ORAL | Status: DC

## 2013-09-29 NOTE — Telephone Encounter (Signed)
Rx for Prednisone has been resent to [facility]. Pt is aware. Nothing further is needed.

## 2013-10-06 ENCOUNTER — Encounter: Payer: Self-pay | Admitting: Internal Medicine

## 2013-10-06 ENCOUNTER — Other Ambulatory Visit (INDEPENDENT_AMBULATORY_CARE_PROVIDER_SITE_OTHER): Payer: Self-pay

## 2013-10-06 ENCOUNTER — Ambulatory Visit (INDEPENDENT_AMBULATORY_CARE_PROVIDER_SITE_OTHER): Payer: Self-pay | Admitting: Internal Medicine

## 2013-10-06 VITALS — BP 114/62 | HR 76 | Ht 65.0 in | Wt 150.2 lb

## 2013-10-06 DIAGNOSIS — D869 Sarcoidosis, unspecified: Secondary | ICD-10-CM

## 2013-10-06 DIAGNOSIS — G4700 Insomnia, unspecified: Secondary | ICD-10-CM

## 2013-10-06 LAB — HEPATIC FUNCTION PANEL
ALK PHOS: 63 U/L (ref 39–117)
ALT: 16 U/L (ref 0–35)
AST: 24 U/L (ref 0–37)
Albumin: 3.6 g/dL (ref 3.5–5.2)
BILIRUBIN DIRECT: 0.1 mg/dL (ref 0.0–0.3)
BILIRUBIN TOTAL: 0.7 mg/dL (ref 0.3–1.2)
Total Protein: 7.6 g/dL (ref 6.0–8.3)

## 2013-10-06 LAB — BASIC METABOLIC PANEL
BUN: 11 mg/dL (ref 6–23)
CALCIUM: 8.8 mg/dL (ref 8.4–10.5)
CHLORIDE: 106 meq/L (ref 96–112)
CO2: 28 mEq/L (ref 19–32)
CREATININE: 0.8 mg/dL (ref 0.4–1.2)
GFR: 98.56 mL/min (ref >60.00)
Glucose, Bld: 81 mg/dL (ref 70–99)
Potassium: 3.8 mEq/L (ref 3.5–5.1)
SODIUM: 139 meq/L (ref 135–145)

## 2013-10-06 LAB — ANGIOTENSIN CONVERTING ENZYME: ANGIOTENSIN-CONVERTING ENZYME: 58 U/L — AB (ref 8–52)

## 2013-10-06 MED ORDER — TEMAZEPAM 15 MG PO CAPS: ORAL_CAPSULE | ORAL | Status: DC
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
## 2013-10-06 NOTE — Patient Instructions (Signed)
Order- lab- ACE level, BMET, Liver panel   Script for temazepam to help with sleep

## 2013-10-07 ENCOUNTER — Ambulatory Visit: Payer: BC Managed Care – PPO | Admitting: Obstetrics & Gynecology

## 2013-10-13 ENCOUNTER — Ambulatory Visit: Payer: BC Managed Care – PPO | Admitting: Internal Medicine

## 2013-10-24 NOTE — Assessment & Plan Note (Signed)
Skin lesions are nonspecific but might be from sarcoid. I can't feel her spleen. Plan-lab for ACE level and chemistries

## 2013-10-24 NOTE — Assessment & Plan Note (Signed)
Nonspecific. Prednisone is apt to make it worse. Educated sleep hygiene. Plan-temazepam

## 2013-10-26 ENCOUNTER — Encounter: Payer: Self-pay | Admitting: Obstetrics & Gynecology

## 2013-10-26 ENCOUNTER — Ambulatory Visit (INDEPENDENT_AMBULATORY_CARE_PROVIDER_SITE_OTHER): Payer: BC Managed Care – PPO | Admitting: Obstetrics & Gynecology

## 2013-10-26 VITALS — BP 107/73 | HR 94 | Temp 98.5°F | Ht 65.0 in | Wt 148.0 lb

## 2013-10-26 DIAGNOSIS — N390 Urinary tract infection, site not specified: Secondary | ICD-10-CM

## 2013-10-26 DIAGNOSIS — B3731 Acute candidiasis of vulva and vagina: Secondary | ICD-10-CM

## 2013-10-26 DIAGNOSIS — N926 Irregular menstruation, unspecified: Secondary | ICD-10-CM

## 2013-10-26 DIAGNOSIS — B373 Candidiasis of vulva and vagina: Secondary | ICD-10-CM

## 2013-10-26 DIAGNOSIS — N939 Abnormal uterine and vaginal bleeding, unspecified: Secondary | ICD-10-CM

## 2013-10-26 LAB — POCT URINALYSIS DIPSTICK
Glucose, UA: NEGATIVE
KETONES UA: NEGATIVE
Leukocytes, UA: NEGATIVE
Nitrite, UA: POSITIVE
Spec Grav, UA: 1.02
pH, UA: 5

## 2013-10-26 LAB — POCT URINE PREGNANCY: Preg Test, Ur: NEGATIVE

## 2013-10-26 MED ORDER — FLUCONAZOLE 150 MG PO TABS: 150.0000 mg | ORAL_TABLET | Freq: Once | ORAL | Status: DC

## 2013-10-26 MED ORDER — SULFAMETHOXAZOLE-TMP DS 800-160 MG PO TABS: 1.0000 | ORAL_TABLET | Freq: Two times a day (BID) | ORAL | Status: DC

## 2013-10-26 NOTE — Progress Notes (Signed)
Subjective:     Sara Nelson is a 38 y.o. female here for a routine exam.  Current complaints: Patient is in the office today with c/o odor. Patient is also having "hard and sore nipples" and bloating.  Personal health questionnaire reviewed: no.   Gynecologic History Patient's last menstrual period was 09/28/2013. Contraception: tubal ligation Last Pap: 11/20/2012. Results were: normal Last mammogram:never  Obstetric History OB History  No data available     The following portions of the patient's history were reviewed and updated as appropriate: allergies, current medications, past family history, past medical history, past social history, past surgical history and problem list.  Review of Systems Pertinent items are noted in HPI.    Objective:      General appearance: alert Breasts: normal appearance, no masses or tenderness Abdomen: soft, non-tender; bowel sounds normal; no masses,  no organomegaly Pelvic: cervix normal in appearance, external genitalia normal, no adnexal masses or tenderness, uterus normal size, shape, and consistency and vagina normal with with curd-like discharge       50% of 15 min visit spent on counseling and coordination of care.   Assessment:    Symptoms related to endometriosis controlled at present Likely candida vulvovaginitis, UTI  Plan:   Diflucan/Bactrim Return prn or in 1 yr

## 2013-10-27 LAB — PAP IG, CT-NG, RFX HPV ASCU
Chlamydia Probe Amp: NEGATIVE
GC Probe Amp: NEGATIVE

## 2013-10-27 LAB — RPR

## 2013-10-27 LAB — HIV ANTIBODY (ROUTINE TESTING W REFLEX): HIV: NONREACTIVE

## 2013-10-29 ENCOUNTER — Encounter: Payer: Self-pay | Admitting: Obstetrics & Gynecology

## 2013-10-29 NOTE — Patient Instructions (Signed)

## 2013-12-08 ENCOUNTER — Ambulatory Visit: Payer: Self-pay | Admitting: Internal Medicine

## 2014-01-12 ENCOUNTER — Ambulatory Visit (INDEPENDENT_AMBULATORY_CARE_PROVIDER_SITE_OTHER): Payer: Self-pay | Admitting: Internal Medicine

## 2014-01-12 ENCOUNTER — Other Ambulatory Visit (INDEPENDENT_AMBULATORY_CARE_PROVIDER_SITE_OTHER): Payer: Self-pay

## 2014-01-12 ENCOUNTER — Encounter: Payer: Self-pay | Admitting: Internal Medicine

## 2014-01-12 VITALS — BP 118/68 | HR 83 | Ht 65.0 in | Wt 151.6 lb

## 2014-01-12 DIAGNOSIS — B3731 Acute candidiasis of vulva and vagina: Secondary | ICD-10-CM

## 2014-01-12 DIAGNOSIS — J3089 Other allergic rhinitis: Secondary | ICD-10-CM

## 2014-01-12 DIAGNOSIS — J302 Other seasonal allergic rhinitis: Secondary | ICD-10-CM

## 2014-01-12 DIAGNOSIS — B373 Candidiasis of vulva and vagina: Secondary | ICD-10-CM

## 2014-01-12 DIAGNOSIS — G4700 Insomnia, unspecified: Secondary | ICD-10-CM

## 2014-01-12 DIAGNOSIS — D869 Sarcoidosis, unspecified: Secondary | ICD-10-CM

## 2014-01-12 DIAGNOSIS — J309 Allergic rhinitis, unspecified: Secondary | ICD-10-CM

## 2014-01-12 LAB — CBC WITH DIFFERENTIAL/PLATELET
Basophils Absolute: 0.1 10*3/uL (ref 0.0–0.1)
Basophils Relative: 1.1 % (ref 0.0–3.0)
EOS ABS: 0.3 10*3/uL (ref 0.0–0.7)
Eosinophils Relative: 6.9 % — ABNORMAL HIGH (ref 0.0–5.0)
HCT: 33.3 % — ABNORMAL LOW (ref 36.0–46.0)
Hemoglobin: 10.9 g/dL — ABNORMAL LOW (ref 12.0–15.0)
LYMPHS PCT: 37.2 % (ref 12.0–46.0)
Lymphs Abs: 1.7 10*3/uL (ref 0.7–4.0)
MCHC: 32.8 g/dL (ref 30.0–36.0)
MCV: 77.2 fl — AB (ref 78.0–100.0)
MONO ABS: 0.5 10*3/uL (ref 0.1–1.0)
Monocytes Relative: 10.3 % (ref 3.0–12.0)
NEUTROS PCT: 44.5 % (ref 43.0–77.0)
Neutro Abs: 2 10*3/uL (ref 1.4–7.7)
PLATELETS: 345 10*3/uL (ref 150.0–400.0)
RBC: 4.31 Mil/uL (ref 3.87–5.11)
RDW: 15.5 % (ref 11.5–15.5)
WBC: 4.5 10*3/uL (ref 4.0–10.5)

## 2014-01-12 MED ORDER — FLUCONAZOLE 150 MG PO TABS: 150.0000 mg | ORAL_TABLET | Freq: Every day | ORAL | Status: DC
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
## 2014-01-12 NOTE — Patient Instructions (Signed)
Script for diflucan for yeast sent   Ok to use temazepam if needed for sleep  Ok to use Zyrtec when needed as an antihistamine  Order lab- ACE level, CBC w diff,    Dx sarcoid

## 2014-01-13 LAB — ANGIOTENSIN CONVERTING ENZYME: Angiotensin-Converting Enzyme: 53 U/L — ABNORMAL HIGH (ref 8–52)

## 2014-03-21 DIAGNOSIS — B3731 Acute candidiasis of vulva and vagina: Secondary | ICD-10-CM | POA: Insufficient documentation

## 2014-03-21 DIAGNOSIS — J302 Other seasonal allergic rhinitis: Secondary | ICD-10-CM | POA: Insufficient documentation

## 2014-03-21 DIAGNOSIS — B373 Candidiasis of vulva and vagina: Secondary | ICD-10-CM | POA: Insufficient documentation

## 2014-03-21 DIAGNOSIS — J3089 Other allergic rhinitis: Secondary | ICD-10-CM | POA: Insufficient documentation

## 2014-03-21 NOTE — Assessment & Plan Note (Signed)
By patient report Plan-Diflucan

## 2014-03-21 NOTE — Assessment & Plan Note (Signed)
Good response to prn temazepam 15, used occasionally. Educated again on basic sleep hygiene

## 2014-03-21 NOTE — Assessment & Plan Note (Signed)
Plan-Zyrtec seems sufficient

## 2014-03-21 NOTE — Assessment & Plan Note (Signed)
ACE 10/06/13 was down to 58. Sarcoid may be fading in importance Plan-update ACE level

## 2014-07-13 ENCOUNTER — Ambulatory Visit (INDEPENDENT_AMBULATORY_CARE_PROVIDER_SITE_OTHER)
Admission: RE | Admit: 2014-07-13 | Discharge: 2014-07-13 | Disposition: A | Discharge status: Home / Self Care | Payer: Self-pay | Source: Ambulatory Visit | Attending: Internal Medicine | Admitting: Internal Medicine

## 2014-07-13 ENCOUNTER — Encounter: Payer: Self-pay | Admitting: Internal Medicine

## 2014-07-13 ENCOUNTER — Other Ambulatory Visit: Payer: Self-pay

## 2014-07-13 ENCOUNTER — Ambulatory Visit (INDEPENDENT_AMBULATORY_CARE_PROVIDER_SITE_OTHER): Payer: Self-pay | Admitting: Internal Medicine

## 2014-07-13 VITALS — BP 110/60 | HR 76 | Ht 65.0 in | Wt 152.2 lb

## 2014-07-13 DIAGNOSIS — D869 Sarcoidosis, unspecified: Secondary | ICD-10-CM

## 2014-07-13 NOTE — Assessment & Plan Note (Signed)
She is concerned left flank symptoms might represent reactivation of sarcoid. It seems more mechanical to me. Consider role of endometriosis. Plan-ACE level, chest x-ray
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
## 2014-07-13 NOTE — Patient Instructions (Signed)
Order- lab- ACE level   Dx sarcoid             CXR

## 2014-07-14 LAB — ANGIOTENSIN CONVERTING ENZYME: ANGIOTENSIN-CONVERTING ENZYME: 62 U/L — AB (ref 8–52)

## 2014-07-16 ENCOUNTER — Telehealth: Payer: Self-pay | Admitting: Internal Medicine

## 2014-07-16 MED ORDER — PREDNISONE 10 MG PO TABS: ORAL_TABLET | ORAL | Status: DC

## 2014-07-16 NOTE — Telephone Encounter (Signed)
Notes Recorded by Deneise Lever, MD on 07/14/2014 at 12:38 PM CXR shows increased markings in the right lung which may be reactivation of sarcoid. ACE level is higher than last time, which also suggests a sarcoid flare. Recommend- prednisone 10 mg, # 30,  2 x 7 days, 1 x 7 days, then one every other day. Call us when she runs out. ----  I spoke with patient about results and she verbalized understanding and had no questions. RX for pred sent in

## 2014-07-26 ENCOUNTER — Encounter: Payer: Self-pay | Admitting: *Deleted

## 2014-07-27 ENCOUNTER — Telehealth: Payer: Self-pay | Admitting: Internal Medicine

## 2014-07-27 ENCOUNTER — Encounter: Payer: Self-pay | Admitting: Obstetrics & Gynecology

## 2014-07-27 NOTE — Telephone Encounter (Signed)
Ok to refill temazepam, same strength, # 30, 1 for sleep if needed, refill x 5

## 2014-07-27 NOTE — Telephone Encounter (Signed)
She was getting temazepam 15 mg, so # 30, 1 for sleep as needed, refill x 5

## 2014-07-27 NOTE — Telephone Encounter (Signed)
Pt calling again - would rx sent to [facility] in [place] Will route back to CY to authorize/deny refills Please advise, thank you.

## 2014-07-27 NOTE — Telephone Encounter (Signed)
Pt is calling and stating that she has about 4 tablets left of the temazepam and would like a refill of this medication.  She has had to start taking this again recently and she stated the other night she had to take 2 tablets to help her get to sleep.  CY please advise if we can refill this for her.  Thanks  Last ov--07/13/14 Next ov--no pending appts.   Allergies  Allergen Reactions  . Ceftin [Cefuroxime Axetil] Shortness Of Breath    Current Outpatient Prescriptions on File Prior to Visit  Medication Sig Dispense Refill  . fluconazole (DIFLUCAN) 150 MG tablet Take 1 tablet (150 mg total) by mouth daily. 2 tablet 4  . predniSONE (DELTASONE) 10 MG tablet Take 2 tabs daily x 7 days, 1 tab daily x 7 days then 1 tablet every other day 30 tablet 0   No current facility-administered medications on file prior to visit.

## 2014-07-27 NOTE — Telephone Encounter (Signed)
I do not see the strength in her medication hx  And according to the note below, she is doubling up and taking 2 to get sleep  Please clarify, thanks!

## 2014-07-28 MED ORDER — TEMAZEPAM 15 MG PO CAPS: 15.0000 mg | ORAL_CAPSULE | Freq: Every evening | ORAL | Status: DC | PRN

## 2014-07-28 NOTE — Telephone Encounter (Signed)
lmtcb for pt.  

## 2014-07-28 NOTE — Telephone Encounter (Signed)
Refill has been called to her pharmacy per CY recs.   i have called and lmom to make the pt aware that rx has been called to her pharmacy.

## 2014-11-18 ENCOUNTER — Other Ambulatory Visit: Payer: Self-pay | Admitting: Internal Medicine

## 2014-11-18 NOTE — Telephone Encounter (Signed)
Ok to refill 

## 2014-11-18 NOTE — Telephone Encounter (Signed)
Please advise on refill request

## 2014-12-10 ENCOUNTER — Telehealth: Payer: Self-pay | Admitting: Internal Medicine

## 2014-12-10 NOTE — Telephone Encounter (Signed)
lmtcb x1 for pt. 

## 2014-12-10 NOTE — Telephone Encounter (Signed)
Ok to try diflucan 100 mg, # 3, 1 daily x 3 days. Her alternative is to try gyne-lotrimin or one of the other otc products

## 2014-12-10 NOTE — Telephone Encounter (Signed)
Spoke with pt. Advised her that she needs to call her PCP or GYN about vaginal yeast infection. States that they are no longer in practice. Advised her that she needs to call their office about this. Pt states that they want to her to come in for OV but she doesn't have the money for that. Discharge is greenish yellow. Wants to know if CY will call something in.  Allergies  Allergen Reactions  . Ceftin [Cefuroxime Axetil] Shortness Of Breath    Current Outpatient Prescriptions on File Prior to Visit  Medication Sig Dispense Refill  . fluconazole (DIFLUCAN) 150 MG tablet TAKE ONE TABLET BY MOUTH ONCE DAILY 2 tablet 0  . predniSONE (DELTASONE) 10 MG tablet Take 2 tabs daily x 7 days, 1 tab daily x 7 days then 1 tablet every other day 30 tablet 0  . temazepam (RESTORIL) 15 MG capsule Take 1 capsule (15 mg total) by mouth at bedtime as needed for sleep. 30 capsule 5   No current facility-administered medications on file prior to visit.    CY - please advise. Thanks.

## 2014-12-13 MED ORDER — FLUCONAZOLE 100 MG PO TABS: 100.0000 mg | ORAL_TABLET | Freq: Every day | ORAL | Status: DC

## 2014-12-13 NOTE — Telephone Encounter (Signed)
Rx has been sent in. Pt is aware. Nothing further is needed at this time.

## 2015-03-08 ENCOUNTER — Other Ambulatory Visit (INDEPENDENT_AMBULATORY_CARE_PROVIDER_SITE_OTHER): Payer: Self-pay

## 2015-03-08 ENCOUNTER — Ambulatory Visit (INDEPENDENT_AMBULATORY_CARE_PROVIDER_SITE_OTHER): Payer: Self-pay | Admitting: Internal Medicine

## 2015-03-08 ENCOUNTER — Encounter: Payer: Self-pay | Admitting: Internal Medicine

## 2015-03-08 VITALS — BP 122/72 | HR 93 | Ht 65.0 in | Wt 142.0 lb

## 2015-03-08 DIAGNOSIS — B373 Candidiasis of vulva and vagina: Secondary | ICD-10-CM

## 2015-03-08 DIAGNOSIS — G4700 Insomnia, unspecified: Secondary | ICD-10-CM

## 2015-03-08 DIAGNOSIS — B3731 Acute candidiasis of vulva and vagina: Secondary | ICD-10-CM

## 2015-03-08 DIAGNOSIS — D869 Sarcoidosis, unspecified: Secondary | ICD-10-CM

## 2015-03-08 LAB — CBC WITH DIFFERENTIAL/PLATELET
Basophils Absolute: 0.1 10*3/uL (ref 0.0–0.1)
Basophils Relative: 1.1 % (ref 0.0–3.0)
EOS ABS: 0.3 10*3/uL (ref 0.0–0.7)
Eosinophils Relative: 4.9 % (ref 0.0–5.0)
HCT: 36.6 % (ref 36.0–46.0)
Hemoglobin: 12.1 g/dL (ref 12.0–15.0)
LYMPHS PCT: 25.2 % (ref 12.0–46.0)
Lymphs Abs: 1.6 10*3/uL (ref 0.7–4.0)
MCHC: 33.1 g/dL (ref 30.0–36.0)
MCV: 76.9 fl — AB (ref 78.0–100.0)
Monocytes Absolute: 0.5 10*3/uL (ref 0.1–1.0)
Monocytes Relative: 7.9 % (ref 3.0–12.0)
Neutro Abs: 3.9 10*3/uL (ref 1.4–7.7)
Neutrophils Relative %: 60.9 % (ref 43.0–77.0)
Platelets: 450 10*3/uL — ABNORMAL HIGH (ref 150.0–400.0)
RBC: 4.76 Mil/uL (ref 3.87–5.11)
RDW: 16.5 % — AB (ref 11.5–15.5)
WBC: 6.4 10*3/uL (ref 4.0–10.5)

## 2015-03-08 LAB — BASIC METABOLIC PANEL
BUN: 13 mg/dL (ref 6–23)
CHLORIDE: 103 meq/L (ref 96–112)
CO2: 26 mEq/L (ref 19–32)
Calcium: 9.4 mg/dL (ref 8.4–10.5)
Creatinine, Ser: 0.78 mg/dL (ref 0.40–1.20)
GFR: 106.52 mL/min (ref >60.00)
GLUCOSE: 90 mg/dL (ref 70–99)
Potassium: 4.6 mEq/L (ref 3.5–5.1)
Sodium: 136 mEq/L (ref 135–145)

## 2015-03-08 LAB — HEPATIC FUNCTION PANEL
ALT: 8 U/L (ref 0–35)
AST: 16 U/L (ref 0–37)
Albumin: 4.1 g/dL (ref 3.5–5.2)
Alkaline Phosphatase: 61 U/L (ref 39–117)
BILIRUBIN DIRECT: 0.1 mg/dL (ref 0.0–0.3)
TOTAL PROTEIN: 8.5 g/dL — AB (ref 6.0–8.3)
Total Bilirubin: 0.8 mg/dL (ref 0.2–1.2)

## 2015-03-08 MED ORDER — ZALEPLON 5 MG PO CAPS: ORAL_CAPSULE | ORAL | Status: DC

## 2015-03-08 MED ORDER — FLUCONAZOLE 150 MG PO TABS: 150.0000 mg | ORAL_TABLET | Freq: Every day | ORAL | Status: DC
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
## 2015-03-08 NOTE — Patient Instructions (Addendum)
Order - CXR    Dx  Sarcoid               Lab-  BMET, Hepatic function panel/ LFTs, CBC w diff, ACE level, HIV serology    Dx Sarcoid   Script Diflucan printed  Script Sonata/ zaleplon printed for insomnia- take one at bedtime if needed

## 2015-03-09 LAB — HIV ANTIBODY (ROUTINE TESTING W REFLEX): HIV 1&2 Ab, 4th Generation: NONREACTIVE

## 2015-03-09 LAB — ANGIOTENSIN CONVERTING ENZYME: Angiotensin-Converting Enzyme: 87 U/L — ABNORMAL HIGH (ref 8–52)

## 2015-03-11 ENCOUNTER — Telehealth: Payer: Self-pay | Admitting: Internal Medicine

## 2015-03-11 NOTE — Telephone Encounter (Signed)
Patient notified of results. Patient will call back to Monday to schedule OV with Dr. Annamaria Boots Nothing further needed.

## 2015-03-13 NOTE — Assessment & Plan Note (Signed)
Sara Nelson, discussed basic sleep hygiene

## 2015-03-13 NOTE — Assessment & Plan Note (Signed)
Agreed to a trial of Diflucan. If she fails to clear she is to see GYN. Suggested she ask her friends who they go to .

## 2015-03-13 NOTE — Assessment & Plan Note (Signed)
I can't tell that spleen is enlarged by exam. Plan-chemistry panels, including HIV at her request

## 2015-09-13 ENCOUNTER — Encounter: Payer: Self-pay | Admitting: Internal Medicine

## 2015-09-13 ENCOUNTER — Ambulatory Visit (INDEPENDENT_AMBULATORY_CARE_PROVIDER_SITE_OTHER)
Admission: RE | Admit: 2015-09-13 | Discharge: 2015-09-13 | Disposition: A | Discharge status: Home / Self Care | Payer: Self-pay | Source: Ambulatory Visit | Attending: Internal Medicine | Admitting: Internal Medicine

## 2015-09-13 ENCOUNTER — Other Ambulatory Visit (INDEPENDENT_AMBULATORY_CARE_PROVIDER_SITE_OTHER): Payer: Self-pay

## 2015-09-13 ENCOUNTER — Ambulatory Visit (INDEPENDENT_AMBULATORY_CARE_PROVIDER_SITE_OTHER): Payer: Self-pay | Admitting: Internal Medicine

## 2015-09-13 VITALS — BP 108/62 | HR 79 | Ht 65.0 in | Wt 133.8 lb

## 2015-09-13 DIAGNOSIS — E0590 Thyrotoxicosis, unspecified without thyrotoxic crisis or storm: Secondary | ICD-10-CM

## 2015-09-13 DIAGNOSIS — J309 Allergic rhinitis, unspecified: Secondary | ICD-10-CM

## 2015-09-13 DIAGNOSIS — D869 Sarcoidosis, unspecified: Secondary | ICD-10-CM

## 2015-09-13 DIAGNOSIS — G4700 Insomnia, unspecified: Secondary | ICD-10-CM

## 2015-09-13 DIAGNOSIS — J302 Other seasonal allergic rhinitis: Secondary | ICD-10-CM

## 2015-09-13 DIAGNOSIS — J3089 Other allergic rhinitis: Secondary | ICD-10-CM

## 2015-09-13 DIAGNOSIS — F429 Obsessive-compulsive disorder, unspecified: Secondary | ICD-10-CM

## 2015-09-13 DIAGNOSIS — L500 Allergic urticaria: Secondary | ICD-10-CM

## 2015-09-13 LAB — TSH: TSH: 0.7 u[IU]/mL (ref 0.35–4.50)

## 2015-09-13 NOTE — Patient Instructions (Addendum)
Order- CXR  Dx sarcoid  Order- lab TSH   Dx hyperthyroid  Order- referral to [place]     Dx OCD
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
## 2015-09-16 ENCOUNTER — Telehealth: Payer: Self-pay | Admitting: Internal Medicine

## 2015-09-16 NOTE — Telephone Encounter (Signed)
Result Notes     Notes Recorded by Lorane Gell, CMA on 09/15/2015 at 11:21 AM [facility] ------  Notes Recorded by Deneise Lever, MD on 09/13/2015 at 4:59 PM CXR- stable changes of sarcoid scarring, but no evidence of new active disease seen.   Result Notes     Notes Recorded by Lorane Gell, CMA on 09/15/2015 at 11:21 AM [facility] ------  Notes Recorded by Deneise Lever, MD on 09/13/2015 at 5:03 PM Thyroid test within normal. Recommend establishing with a primary doctor who can recheck this later.     Pt is aware of results. Nothing further was needed.

## 2015-09-16 NOTE — Telephone Encounter (Signed)
Patient returned call, asked that we call her at [place] [phone].

## 2015-09-23 DIAGNOSIS — F429 Obsessive-compulsive disorder, unspecified: Secondary | ICD-10-CM | POA: Insufficient documentation

## 2015-09-23 NOTE — Assessment & Plan Note (Signed)
No recent hives

## 2015-09-23 NOTE — Assessment & Plan Note (Signed)
ACE remains elevated. We will check chest x-ray. Hope to avoid systemic steroids

## 2015-09-23 NOTE — Assessment & Plan Note (Signed)
We reviewed good sleep hygiene again. Plan-check lab TSH; want to make sure we are not missing hyperthyroidism as a cause of insomnia.

## 2015-09-23 NOTE — Assessment & Plan Note (Signed)
She reports this diagnosis made years ago. It might relate to her tendency to somatic complaints. She asked for direction to reassess, so I suggested Behavioral Health.

## 2015-09-23 NOTE — Assessment & Plan Note (Signed)
Mild rhinitis with little visible inflammation. Conjunctivae look clear. We discussed overlap between sarcoid and allergy symptoms. Plan antihistamine and nasal steroid spray as needed this spring

## 2016-01-16 ENCOUNTER — Telehealth: Payer: Self-pay | Admitting: Internal Medicine

## 2016-01-16 ENCOUNTER — Ambulatory Visit: Payer: Self-pay | Admitting: Internal Medicine

## 2016-01-16 NOTE — Telephone Encounter (Signed)
Patient is asking we call her at work with appointment, [phone].  Says she will be free Wednesday June 21 in afternoon, or July 3rd anytime that day. Ask for her and please do not leave msg just to call her back.

## 2016-01-16 NOTE — Telephone Encounter (Signed)
Unless CY has routine open slot please have patient see NP. Thanks.

## 2016-01-16 NOTE — Telephone Encounter (Signed)
Please have patient come to see TP on 01-30-16 AM office-okay to use a held spot(approved by [facility] and ac). Thanks.

## 2016-01-16 NOTE — Telephone Encounter (Signed)
Attempted to contact pt. No answer, voicemail is full. Will try back. 

## 2016-01-16 NOTE — Telephone Encounter (Signed)
Called to schedule patient with a NP.  She said that she wants an appointment after 1pm on June 21st or on July 3rd.  We have no appointments available with NP's on these dates.  Dr. Annamaria Boots has a blocked out spot on 7/3.  Can this place be used for this patient?   Katie - please advise.

## 2016-01-16 NOTE — Telephone Encounter (Signed)
Attempted to contact patient to discuss scheduling appointment. Left message for her to call back.  Katie - where can we add patient on schedule?  See message below.

## 2016-01-17 NOTE — Telephone Encounter (Signed)
ATC, Unable to leave a vmail  Sent SMS message with call back number

## 2016-01-17 NOTE — Telephone Encounter (Signed)
Patient called and rescheduled appt with TP on 07/03 at 11:15 am per phone msg.

## 2016-01-30 ENCOUNTER — Ambulatory Visit (INDEPENDENT_AMBULATORY_CARE_PROVIDER_SITE_OTHER): Payer: Self-pay | Admitting: Adult Health

## 2016-01-30 ENCOUNTER — Encounter: Payer: Self-pay | Admitting: Adult Health

## 2016-01-30 VITALS — BP 102/66 | HR 99 | Ht 65.0 in | Wt 129.0 lb

## 2016-01-30 DIAGNOSIS — D869 Sarcoidosis, unspecified: Secondary | ICD-10-CM

## 2016-01-30 NOTE — Addendum Note (Signed)
Addended by: Osa Craver on: 01/30/2016 12:30 PM   Modules accepted: Orders

## 2016-01-30 NOTE — Progress Notes (Signed)
Subjective:    Patient ID: Sara Nelson, female    DOB: 1976/03/05, 40 y.o.   MRN: [id number]  HPI 40 yo never cigarette smoker with Sarcoid /node bx positive , and urticaria  Previously   01/30/2016 Follow up : Sarcoid  Pt returns for 5 month follow up .  Breathing is doing well . No cough .  Gets occasional rash along antecubital space in arms. Better when she stopped using dial soap. Now uses sensitive skin soap.  Has some eye redness and puffiness.  Seen by eye doctor , Dr. Radford Pax . Does not feel eye symptoms are from sarcoid .  Felt she had dry eyes . She is using some otc dry eye drops which has helped some.  Smoked THC , quit for a while . Started brewing THC in tea . Has cut back on this.  Thought it might help her sarcoid. Discussed not to use this.  Denies chest pain, orthopnea, edema or fever.      Past Medical History  Diagnosis Date  . Hyperlipidemia   . Endometriosis   . Sarcoid ([place])    Current Outpatient Prescriptions on File Prior to Visit  Medication Sig Dispense Refill  . temazepam (RESTORIL) 15 MG capsule Take 1 capsule (15 mg total) by mouth at bedtime as needed for sleep. 30 capsule 5  . zaleplon (SONATA) 5 MG capsule 1 or 2 at bedtime if needed for sleep 30 capsule 5   No current facility-administered medications on file prior to visit.      Review of Systems Constitutional:   No  weight loss, night sweats,  Fevers, chills, fatigue, or  lassitude.  HEENT:   No headaches,  Difficulty swallowing,  Tooth/dental problems, or  Sore throat,                No sneezing, itching, ear ache, nasal congestion, post nasal drip,   CV:  No chest pain,  Orthopnea, PND, swelling in lower extremities, anasarca, dizziness, palpitations, syncope.   GI  No heartburn, indigestion, abdominal pain, nausea, vomiting, diarrhea, change in bowel habits, loss of appetite, bloody stools.   Resp: No shortness of breath with exertion or at rest.  No excess mucus, no  productive cough,  No non-productive cough,  No coughing up of blood.  No change in color of mucus.  No wheezing.  No chest wall deformity  Skin: no rash or lesions.  GU: no dysuria, change in color of urine, no urgency or frequency.  No flank pain, no hematuria   MS:  No joint pain or swelling.  No decreased range of motion.  No back pain.  Psych:  No change in mood or affect. No depression or anxiety.  No memory loss.         Objective:   Physical Exam Filed Vitals:   01/30/16 1139  BP: 102/66  Pulse: 99  Height: 5\' 5"  (1.651 m)  Weight: 129 lb (58.514 kg)  SpO2: 99%   GEN: A/Ox3; pleasant , NAD   HEENT:  [place]/AT,  EACs-clear, TMs-wnl, NOSE-clear, THROAT-clear, no lesions, no postnasal drip or exudate noted.   NECK:  Supple w/ fair ROM; no JVD; normal carotid impulses w/o bruits; no thyromegaly or nodules palpated; no lymphadenopathy.  RESP  Clear  P & A; w/o, wheezes/ rales/ or rhonchi.no accessory muscle use, no dullness to percussion  CARD:  RRR, no m/r/g  , no peripheral edema, pulses intact, no cyanosis or clubbing.  GI:   Soft &  nt; nml bowel sounds; no organomegaly or masses detected.  Musco: Warm bil, no deformities or joint swelling noted.   Neuro: alert, no focal deficits noted.    Skin: Warm, no lesions or rashes, no rashes noted.   Tammy Parrett NP-C  [facility]  01/30/2016

## 2016-01-30 NOTE — Patient Instructions (Signed)
Continue on current regimen  Follow up with Dr. Annamaria Boots  In 6 months with PFT and As needed

## 2016-01-30 NOTE — Assessment & Plan Note (Signed)
Does not appear to be active  Advised to keep follow up with opthamology  Recent cxr in feb 2017 with stable changes Check PFT on return   Plan  Continue on current regimen  Follow up with Dr. Annamaria Boots  In 6 months with PFT and As needed

## 2016-06-25 ENCOUNTER — Ambulatory Visit (INDEPENDENT_AMBULATORY_CARE_PROVIDER_SITE_OTHER): Payer: Self-pay | Admitting: Acute Care

## 2016-06-25 ENCOUNTER — Other Ambulatory Visit: Payer: Self-pay

## 2016-06-25 ENCOUNTER — Ambulatory Visit (INDEPENDENT_AMBULATORY_CARE_PROVIDER_SITE_OTHER)
Admission: RE | Admit: 2016-06-25 | Discharge: 2016-06-25 | Disposition: A | Discharge status: Home / Self Care | Payer: Self-pay | Source: Ambulatory Visit | Attending: Acute Care | Admitting: Acute Care

## 2016-06-25 ENCOUNTER — Encounter: Payer: Self-pay | Admitting: Acute Care

## 2016-06-25 VITALS — BP 102/70 | HR 79 | Ht 65.0 in | Wt 141.6 lb

## 2016-06-25 DIAGNOSIS — D869 Sarcoidosis, unspecified: Secondary | ICD-10-CM

## 2016-06-25 LAB — ANGIOTENSIN CONVERTING ENZYME: Angiotensin-Converting Enzyme: 73 U/L — ABNORMAL HIGH (ref 9–67)

## 2016-06-25 MED ORDER — LORATADINE 10 MG PO TABS: 10.0000 mg | ORAL_TABLET | Freq: Every day | ORAL | 0 refills | Status: DC

## 2016-06-25 MED ORDER — PREDNISONE 5 MG PO TABS: ORAL_TABLET | ORAL | 0 refills | Status: DC

## 2016-06-25 NOTE — Progress Notes (Signed)
History of Present Illness Sara Nelson is a 40 y.o. female never smoker  with biopsy proven sarcoid followed by Dr. Annamaria Boots.  HPI 40 yo never cigarette smoker with Sarcoid /node bx positive , and urticaria. Currently not taking maintenance prednisone.  06/25/2016 Acute OV:  Pt. Presents to the office today with new onset left eye swelling that started in March of 2017. More recently, in the last 2 weeks, She has noticed that her left eye is swollen in the morning and she states her spleen feels swollen again. She states she has been to see her eye doctor last in May 2017 and was diagnosed with dry eyes. She has been using drops to treat this.she states that the ophthalmologist does not believe she has sarcoid involvement in her eyes. She states she is smoking high grade mariajuana for the pain she is experiencing. She is currently not taking any maintenance prednisone. She does have a cough which is dry and non-productive.She has recently been diagnosed with Tricamonis and has been treated with Flagyl.I have encouraged her to avoid treatment with cannibus, but if she uses it I have asked her not to smoke it!! She states she will try to use it as a tea. She states it is the only thing that helps with her flares. She does not have a rash at present. She has good follow-up with her ophthalmologist. She is not taking her Claritin daily. She denies chest pain, fever, orthopnea, or hemoptysis.   Tests CXR 06/25/2016 IMPRESSION: Unchanged appearance of the chest.  Chronic changes of sarcoidosis.  ACE 06/25/2016 73 U/L  Past medical hx Past Medical History:  Diagnosis Date  . Endometriosis   . Hyperlipidemia   . Sarcoid ([place])      Past surgical hx, Family hx, Social hx all reviewed.  Current Outpatient Prescriptions on File Prior to Visit  Medication Sig  . temazepam (RESTORIL) 15 MG capsule Take 1 capsule (15 mg total) by mouth at bedtime as needed for sleep. (Patient not taking:  Reported on 06/25/2016)  . zaleplon (SONATA) 5 MG capsule 1 or 2 at bedtime if needed for sleep (Patient not taking: Reported on 06/25/2016)   No current facility-administered medications on file prior to visit.      Allergies  Allergen Reactions  . Ceftin [Cefuroxime Axetil] Shortness Of Breath    Review Of Systems:  Constitutional:   No  weight loss, night sweats,  Fevers, chills, +fatigue, or  lassitude.  HEENT:   No headaches,  Difficulty swallowing,  Tooth/dental problems, or  Sore throat,                No sneezing, itching, ear ache, nasal congestion, +post nasal drip,   CV:  No chest pain,  Orthopnea, PND, swelling in lower extremities, anasarca, dizziness, palpitations, syncope.   GI  No heartburn, indigestion, abdominal pain, nausea, vomiting, diarrhea, change in bowel habits, loss of appetite, bloody stools. States she feels fullness in her left abdomen.  Resp: No shortness of breath with exertion or at rest.  No excess mucus, no productive cough,  No non-productive cough,  No coughing up of blood.  No change in color of mucus.  No wheezing.  No chest wall deformity  Skin: no rash or lesions.  GU: no dysuria, change in color of urine, no urgency or frequency.  No flank pain, no hematuria, + nose bleeds in the am   MS:  No joint pain or swelling.  No decreased range of motion.  No back pain.  Psych:  No change in mood or affect. No depression or anxiety.  No memory loss.   Vital Signs BP 102/70 (BP Location: Left Arm, Patient Position: Sitting, Cuff Size: Normal)   Pulse 79   Ht 5\' 5"  (1.651 m)   Wt 141 lb 9.6 oz (64.2 kg)   LMP 06/13/2016   SpO2 97%   BMI 23.56 kg/m    Physical Exam:  General- No distress,  A&Ox3, pleasant ENT: No sinus tenderness, TM clear, pale nasal mucosa, no oral exudate,no post nasal drip, no LAN Cardiac: S1, S2, regular rate and rhythm, no murmur Chest: No wheeze/ rales/ dullness; no accessory muscle use, no nasal flaring, no sternal  retractions Abd.: Soft Non-tender Ext: No clubbing cyanosis, edema Neuro:  normal strength Skin: No rashes, warm and dry Psych: normal mood and behavior   Assessment/Plan  Sarcoid (HCC) Mild Flare Sarcoid Plan: CXR today We will check an ACE level today. We will prescribe prednisone 20 mg x 14 days, then 10 mg x 7 days, then 5 mg x 7 days. Nasal Saline several times daily for dry nasal passages Claritin 10 mg once daily for post nasal drip. Remember to follow up with your optomologist. Will need PFT's to evaluate progression if CXR indicates worsening. Follow up with Dr. Annamaria Boots or NP 4 weeks Please contact office for sooner follow up if symptoms do not improve or worsen or seek emergency care       [name], NP 06/25/2016  7:33 PM

## 2016-06-25 NOTE — Assessment & Plan Note (Addendum)
Mild Flare Sarcoid Plan: CXR today We will check an ACE level today. We will prescribe prednisone 20 mg x 14 days, then 10 mg x 7 days, then 5 mg x 7 days. Nasal Saline several times daily for dry nasal passages Claritin 10 mg once daily for post nasal drip. Remember to follow up with your optomologist. Will need PFT's to evaluate progression if CXR indicates worsening. Follow up with Dr. Annamaria Boots or NP 4 weeks Please contact office for sooner follow up if symptoms do not improve or worsen or seek emergency care

## 2016-06-25 NOTE — Patient Instructions (Addendum)
It is nice to meet you today. CXR today We will check an ACE level today. We will prescribe prednisone 20 mg x 14 days, then 10 mg x 7 days, then 5 mg x 7 days. Nasal Saline several times daily for dry nasal passages Claritin 10 mg once daily for post nasal drip. Remember to follow up with your optomologist. Follow up with Dr. Annamaria Boots or NP 4 weeks Please contact office for sooner follow up if symptoms do not improve or worsen or seek emergency care

## 2016-07-04 ENCOUNTER — Telehealth: Payer: Self-pay | Admitting: Internal Medicine

## 2016-07-04 NOTE — Telephone Encounter (Signed)
SG please advise on lab results from11/27.  Pt is calling for these. Thanks  Allergies  Allergen Reactions  . Ceftin [Cefuroxime Axetil] Shortness Of Breath

## 2016-07-05 NOTE — Telephone Encounter (Signed)
lmtcb x1 for pt. 

## 2016-07-05 NOTE — Telephone Encounter (Signed)
Please let her know her ACE level was 73, which was slightly elevated, but lower than it was when checked in August of 2016. Thanks.

## 2016-07-09 NOTE — Telephone Encounter (Signed)
Spoke with pt. And informed her of the results per SG. Nothing further is needed at this time.

## 2016-07-09 NOTE — Telephone Encounter (Signed)
PATIENT IN LOBBY wanting to discuss results.

## 2016-07-13 ENCOUNTER — Encounter: Payer: Self-pay | Admitting: Adult Health

## 2016-07-16 ENCOUNTER — Encounter: Payer: Self-pay | Admitting: Adult Health

## 2016-07-24 ENCOUNTER — Encounter: Payer: Self-pay | Admitting: Adult Health

## 2016-07-24 ENCOUNTER — Telehealth: Payer: Self-pay

## 2016-07-24 ENCOUNTER — Ambulatory Visit (INDEPENDENT_AMBULATORY_CARE_PROVIDER_SITE_OTHER): Payer: Self-pay | Admitting: Adult Health

## 2016-07-24 VITALS — BP 98/70 | HR 75 | Temp 98.4°F | Ht 65.0 in | Wt 148.0 lb

## 2016-07-24 DIAGNOSIS — D869 Sarcoidosis, unspecified: Secondary | ICD-10-CM

## 2016-07-24 MED ORDER — PREDNISONE 5 MG PO TABS: ORAL_TABLET | ORAL | 0 refills | Status: DC

## 2016-07-24 NOTE — Telephone Encounter (Signed)
Pt called [facility] site on [address] in [place] is correct pharmacy and thankful for the refill.

## 2016-07-24 NOTE — Progress Notes (Signed)
@Patient  ID: Sara Nelson, female    DOB: 20-May-1976, 40 y.o.   MRN: [id number]  Chief Complaint  Patient presents with  . Follow-up    Sarcoid     Referring provider: No ref. provider found  HPI: 40 yo female never cigarette smoker followed for Sarcoid (Node bx positive ) and Urticaria   07/24/2016 Follow up : Sarcoid  Pt returns for 4 week follow up from recent Sarcoid flare . She was have swollen eyes and dry cough last ov . She also felt her spleen was enlarged . ACE level was 73. CXR reviewed and showed chronic changes of sarcoid. She was started on prolonged steroid taper . She finished few days ago. Says it helped. Cough resolved. But last 1-2 days feels eye is irritated again. Has seen opthalmology several times.  We discussed side effects of steroids.  Denies sob, cough , rash , or n/v.d.   Feels THC /THC oil works best to control her sarcoid sx , believes it lowered her ACE level . I emphasized to her again I do not have any knowledge of this and therefore can not recommend this treatment regimen .   She does not have medical insurance   Allergies  Allergen Reactions  . Ceftin [Cefuroxime Axetil] Shortness Of Breath     There is no immunization history on file for this patient.  Past Medical History:  Diagnosis Date  . Endometriosis   . Hyperlipidemia   . Sarcoid ([place])     Tobacco History: History  Smoking Status  . Never Smoker  Smokeless Tobacco  . Not on file   Counseling given: Not Answered   Outpatient Encounter Prescriptions as of 07/24/2016  Medication Sig  . loratadine (CLARITIN) 10 MG tablet Take 1 tablet (10 mg total) by mouth daily.  Marland Kitchen UNABLE TO FIND thera tears  . zaleplon (SONATA) 5 MG capsule 1 or 2 at bedtime if needed for sleep  . predniSONE (DELTASONE) 5 MG tablet Prednisone 37m daily for 2 weeks and then every other day for 2 weeks  Then may stop  . temazepam (RESTORIL) 15 MG capsule Take 1 capsule (15 mg total) by mouth at  bedtime as needed for sleep. (Patient not taking: Reported on 07/24/2016)  . Tetrahydrozoline HCl (EYE DROPS OP) Apply to eye.  . [DISCONTINUED] predniSONE (DELTASONE) 5 MG tablet Take 4 tabs for 14 days, then 2 tabs for 7 days, then 1 tab for 7 days, then stop (Patient not taking: Reported on 07/24/2016)   No facility-administered encounter medications on file as of 07/24/2016.      Review of Systems  Constitutional:   No  weight loss, night sweats,  Fevers, chills, fatigue, or  lassitude.  HEENT:   No headaches,  Difficulty swallowing,  Tooth/dental problems, or  Sore throat,                No sneezing, itching, ear ache, nasal congestion, post nasal drip,   CV:  No chest pain,  Orthopnea, PND, swelling in lower extremities, anasarca, dizziness, palpitations, syncope.   GI  No heartburn, indigestion, abdominal pain, nausea, vomiting, diarrhea, change in bowel habits, loss of appetite, bloody stools.   Resp: No shortness of breath with exertion or at rest.  No excess mucus, no productive cough,  No non-productive cough,  No coughing up of blood.  No change in color of mucus.  No wheezing.  No chest wall deformity  Skin: no rash or lesions.  GU: no dysuria,  change in color of urine, no urgency or frequency.  No flank pain, no hematuria   MS:  No joint pain or swelling.  No decreased range of motion.  No back pain.    Physical Exam  BP 98/70 (BP Location: Right Arm, Patient Position: Sitting, Cuff Size: Normal)   Pulse 75   Temp 98.4 F (36.9 C) (Oral)   Ht 5\' 5"  (1.651 m)   Wt 148 lb (67.1 kg)   LMP 06/13/2016   SpO2 100%   BMI 24.63 kg/m   GEN: A/Ox3; pleasant , NAD, well nourished    HEENT:  [place]/AT,  EACs-clear, TMs-wnl, NOSE-clear, THROAT-clear, no lesions, no postnasal drip or exudate noted.   NECK:  Supple w/ fair ROM; no JVD; normal carotid impulses w/o bruits; no thyromegaly or nodules palpated; no lymphadenopathy.    RESP  Clear  P & A; w/o, wheezes/ rales/ or  rhonchi. no accessory muscle use, no dullness to percussion  CARD:  RRR, no m/r/g, no peripheral edema, pulses intact, no cyanosis or clubbing.  GI:   Soft & nt; nml bowel sounds; no organomegaly or masses detected.   Musco: Warm bil, no deformities or joint swelling noted.   Neuro: alert, no focal deficits noted.    Skin: Warm, no lesions or rashes  Psych:  No change in mood or affect. No depression or anxiety.  No memory loss.  Lab Results:  CBC    Component Value Date/Time   WBC 6.4 03/08/2015 1035   RBC 4.76 03/08/2015 1035   HGB 12.1 03/08/2015 1035   HCT 36.6 03/08/2015 1035   PLT 450.0 (H) 03/08/2015 1035   MCV 76.9 (L) 03/08/2015 1035   MCHC 33.1 03/08/2015 1035   RDW 16.5 (H) 03/08/2015 1035   LYMPHSABS 1.6 03/08/2015 1035   MONOABS 0.5 03/08/2015 1035   EOSABS 0.3 03/08/2015 1035   BASOSABS 0.1 03/08/2015 1035    BMET    Component Value Date/Time   NA 136 03/08/2015 1035   K 4.6 03/08/2015 1035   CL 103 03/08/2015 1035   CO2 26 03/08/2015 1035   GLUCOSE 90 03/08/2015 1035   BUN 13 03/08/2015 1035   CREATININE 0.78 03/08/2015 1035   CALCIUM 9.4 03/08/2015 1035   GFRNONAA >60 12/29/2008 1532   GFRAA  12/29/2008 1532    >60        The eGFR has been calculated using the MDRD equation. This calculation has not been validated in all clinical situations. eGFR's persistently <60 mL/min signify possible Chronic Kidney Disease.    BNP No results found for: BNP  ProBNP No results found for: PROBNP  Imaging: Dg Chest 2 View  Result Date: 06/25/2016 CLINICAL DATA:  Sarcoidosis.  Follow-up.  No current complaints. EXAM: CHEST  2 VIEW COMPARISON:  09/13/2015 FINDINGS: The cardiac silhouette is normal in size. Right greater than left hilar prominence is unchanged and compatible with underlying lymphadenopathy. Right upper lobe volume loss is unchanged, as is mild diffuse bilateral reticular interstitial density. No definite acute airspace consolidation,  edema, pleural effusion, or pneumothorax is identified. No acute osseous abnormality is seen. IMPRESSION: Unchanged appearance of the chest.  Chronic changes of sarcoidosis. Electronically Signed   By: 09/15/2015 M.D.   On: 06/25/2016 10:35     Assessment & Plan:   Sarcoid (HCC) Recent flare -improved with steroids  ? Eye involvement with eyelid inflammation/dry eyes -improves with steroids , resolved on todays exam  Recheck ACE on return  If flares of  steroids, may need to look at steroid sparing tx as she is very young.  Going forward would consider CT chest and PFT    Plan  Patient Instructions  Prednisone 76m daily for 2 weeks and then every other day for 2 weeks  Then may stop.  Follow up with Dr. YAnnamaria Boots In 2 months with PFT and As needed          TRexene Edison NP 07/24/2016

## 2016-07-24 NOTE — Assessment & Plan Note (Signed)
Recent flare -improved with steroids  ? Eye involvement with eyelid inflammation/dry eyes -improves with steroids , resolved on todays exam  Recheck ACE on return  If flares of steroids, may need to look at steroid sparing tx as she is very young.  Going forward would consider CT chest and PFT    Plan  Patient Instructions  Prednisone 5mg  daily for 2 weeks and then every other day for 2 weeks  Then may stop.  Follow up with Dr. Annamaria Boots  In 2 months with PFT and As needed

## 2016-07-24 NOTE — Patient Instructions (Signed)
Prednisone 5mg  daily for 2 weeks and then every other day for 2 weeks  Then may stop.  Follow up with Dr. Annamaria Boots  In 2 months with PFT and As needed

## 2016-07-24 NOTE — Telephone Encounter (Signed)
lmom tcb x1   TP stated that it was ok to call in her temazepam, #30 no refills. The rx has not been sent in as of yet, please verify pharmacy of choice.

## 2016-07-24 NOTE — Telephone Encounter (Signed)
Spoke with pt. And her rx was called in to her pharmacy of choice. Nothing further is needed

## 2016-09-17 ENCOUNTER — Ambulatory Visit: Payer: Self-pay | Admitting: Internal Medicine

## 2017-08-05 ENCOUNTER — Telehealth: Payer: Self-pay | Admitting: Internal Medicine

## 2017-08-05 NOTE — Telephone Encounter (Signed)
Advised patient would put note to nurse to make sure waiting until April for follow up is okay and that an order could be placed for patient to have PFT or if Dr. Annamaria Boots would like to see the patient first.  Please call patient and confirm if appointments are okay to keep.

## 2017-08-05 NOTE — Telephone Encounter (Signed)
Patient could be seen this Friday due to her work schedule. She has been scheduled for CY on 08/19/17 at 130. She has an PFT scheduled for the same day.

## 2017-08-05 NOTE — Telephone Encounter (Signed)
Pt states she feels that sarcoid has moved to her legs. Pt reports of eye swelling with slight clear drainage, & right leg pain from buttock down x85mo. Pt also reports of prod cough with brought yellow mucus x41mo Denies fever, chills, sweats or body aches.  Pt denied any other symptoms.   CY please advise. Thanks.   Current Outpatient Medications on File Prior to Visit  Medication Sig Dispense Refill  . loratadine (CLARITIN) 10 MG tablet Take 1 tablet (10 mg total) by mouth daily. 30 tablet 0  . predniSONE (DELTASONE) 5 MG tablet Prednisone 5mg  daily for 2 weeks and then every other day for 2 weeks  Then may stop 30 tablet 0  . temazepam (RESTORIL) 15 MG capsule Take 1 capsule (15 mg total) by mouth at bedtime as needed for sleep. (Patient not taking: Reported on 07/24/2016) 30 capsule 5  . Tetrahydrozoline HCl (EYE DROPS OP) Apply to eye.    Marland Kitchen UNABLE TO FIND thera tears    . zaleplon (SONATA) 5 MG capsule 1 or 2 at bedtime if needed for sleep 30 capsule 5   No current facility-administered medications on file prior to visit.     Allergies  Allergen Reactions  . Ceftin [Cefuroxime Axetil] Shortness Of Breath

## 2017-08-05 NOTE — Telephone Encounter (Signed)
Pt can be seen Friday 08/09/17 at 11:30am slot(okay to double book). Thanks.

## 2017-08-05 NOTE — Telephone Encounter (Signed)
Patient called back to reschedule PFT for later and states she thinks she has the flu.  She is requesting appointment - nothing available on schedule.  Symptoms - head and eyes hurt, nose and chest hurts, ribcage hurts from coughing, does not have fever that she knows of.  CB is [phone].

## 2017-08-16 ENCOUNTER — Other Ambulatory Visit: Payer: Self-pay | Admitting: Internal Medicine

## 2017-08-16 DIAGNOSIS — D869 Sarcoidosis, unspecified: Secondary | ICD-10-CM

## 2017-08-19 ENCOUNTER — Encounter: Payer: Self-pay | Admitting: Internal Medicine

## 2017-08-19 ENCOUNTER — Ambulatory Visit (INDEPENDENT_AMBULATORY_CARE_PROVIDER_SITE_OTHER)
Admission: RE | Admit: 2017-08-19 | Discharge: 2017-08-19 | Disposition: A | Discharge status: Home / Self Care | Payer: BLUE CROSS/BLUE SHIELD | Source: Ambulatory Visit | Attending: Internal Medicine | Admitting: Internal Medicine

## 2017-08-19 ENCOUNTER — Ambulatory Visit (INDEPENDENT_AMBULATORY_CARE_PROVIDER_SITE_OTHER): Payer: BLUE CROSS/BLUE SHIELD | Admitting: Internal Medicine

## 2017-08-19 ENCOUNTER — Other Ambulatory Visit: Payer: Self-pay | Admitting: *Deleted

## 2017-08-19 ENCOUNTER — Other Ambulatory Visit (INDEPENDENT_AMBULATORY_CARE_PROVIDER_SITE_OTHER): Payer: BLUE CROSS/BLUE SHIELD

## 2017-08-19 VITALS — BP 124/68 | HR 73 | Ht 65.0 in | Wt 148.8 lb

## 2017-08-19 DIAGNOSIS — M5430 Sciatica, unspecified side: Secondary | ICD-10-CM

## 2017-08-19 DIAGNOSIS — D869 Sarcoidosis, unspecified: Secondary | ICD-10-CM

## 2017-08-19 DIAGNOSIS — M5431 Sciatica, right side: Secondary | ICD-10-CM | POA: Diagnosis not present

## 2017-08-19 LAB — HEPATIC FUNCTION PANEL
ALBUMIN: 3.9 g/dL (ref 3.5–5.2)
ALT: 7 U/L (ref 0–35)
AST: 14 U/L (ref 0–37)
Alkaline Phosphatase: 49 U/L (ref 39–117)
BILIRUBIN TOTAL: 0.5 mg/dL (ref 0.2–1.2)
Bilirubin, Direct: 0.1 mg/dL (ref 0.0–0.3)
Total Protein: 7.3 g/dL (ref 6.0–8.3)

## 2017-08-19 LAB — CBC WITH DIFFERENTIAL/PLATELET
BASOS ABS: 0.1 10*3/uL (ref 0.0–0.1)
Basophils Relative: 2 % (ref 0.0–3.0)
EOS PCT: 4.8 % (ref 0.0–5.0)
Eosinophils Absolute: 0.3 10*3/uL (ref 0.0–0.7)
HEMATOCRIT: 37.2 % (ref 36.0–46.0)
Hemoglobin: 12.1 g/dL (ref 12.0–15.0)
LYMPHS ABS: 2.6 10*3/uL (ref 0.7–4.0)
LYMPHS PCT: 39.4 % (ref 12.0–46.0)
MCHC: 32.4 g/dL (ref 30.0–36.0)
MCV: 80.6 fl (ref 78.0–100.0)
MONOS PCT: 7.6 % (ref 3.0–12.0)
Monocytes Absolute: 0.5 10*3/uL (ref 0.1–1.0)
NEUTROS ABS: 3.1 10*3/uL (ref 1.4–7.7)
NEUTROS PCT: 46.2 % (ref 43.0–77.0)
Platelets: 471 10*3/uL — ABNORMAL HIGH (ref 150.0–400.0)
RBC: 4.62 Mil/uL (ref 3.87–5.11)
RDW: 15.6 % — ABNORMAL HIGH (ref 11.5–15.5)
WBC: 6.7 10*3/uL (ref 4.0–10.5)

## 2017-08-19 LAB — PULMONARY FUNCTION TEST
DL/VA % pred: 94 %
DL/VA: 4.65 ml/min/mmHg/L
DLCO UNC: 19.2 ml/min/mmHg
DLCO cor % pred: 73 %
DLCO cor: 18.76 ml/min/mmHg
DLCO unc % pred: 75 %
FEF 25-75 Post: 3.8 L/sec
FEF 25-75 Pre: 3.11 L/sec
FEF2575-%Change-Post: 22 %
FEF2575-%Pred-Post: 131 %
FEF2575-%Pred-Pre: 107 %
FEV1-%CHANGE-POST: 3 %
FEV1-%PRED-POST: 99 %
FEV1-%Pred-Pre: 96 %
FEV1-POST: 2.62 L
FEV1-PRE: 2.54 L
FEV1FVC-%CHANGE-POST: 7 %
FEV1FVC-%Pred-Pre: 103 %
FEV6-%Change-Post: -3 %
FEV6-%Pred-Post: 90 %
FEV6-%Pred-Pre: 93 %
FEV6-POST: 2.84 L
FEV6-PRE: 2.93 L
FEV6FVC-%PRED-POST: 102 %
FEV6FVC-%Pred-Pre: 102 %
FVC-%CHANGE-POST: -3 %
FVC-%PRED-PRE: 92 %
FVC-%Pred-Post: 89 %
FVC-POST: 2.84 L
FVC-PRE: 2.94 L
PRE FEV6/FVC RATIO: 100 %
Post FEV1/FVC ratio: 92 %
Post FEV6/FVC ratio: 100 %
Pre FEV1/FVC ratio: 86 %
RV % PRED: 73 %
RV: 1.21 L
TLC % PRED: 81 %
TLC: 4.22 L

## 2017-08-19 LAB — BASIC METABOLIC PANEL
BUN: 12 mg/dL (ref 6–23)
CO2: 29 meq/L (ref 19–32)
Calcium: 8.9 mg/dL (ref 8.4–10.5)
Chloride: 103 mEq/L (ref 96–112)
Creatinine, Ser: 0.73 mg/dL (ref 0.40–1.20)
GFR: 113.52 mL/min (ref >60.00)
GLUCOSE: 101 mg/dL — AB (ref 70–99)
POTASSIUM: 3.8 meq/L (ref 3.5–5.1)
SODIUM: 138 meq/L (ref 135–145)

## 2017-08-19 MED ORDER — FLUTICASONE FUROATE 200 MCG/ACT IN AEPB: 1.0000 | INHALATION_SPRAY | Freq: Every day | RESPIRATORY_TRACT | 0 refills | Status: DC

## 2017-08-19 NOTE — Progress Notes (Signed)
HPI female never smoker followed for sarcoid/node biopsy positive, urticaria Had sustained prednisone for exacerbation in December 2017  CT scan of chest the 01/22/2012 showed focal airspace disease right upper lobe. Peri-bronchovascular nodular opacity was also seen at the left hilum and more confluent disease in the left lower lobe. There is widespread mediastinal adenopathy. Imaging of the abdomen demonstrated splenomegaly with miliary lesions in the liver and abdominal adenopathy. Differential diagnosis was between sarcoid and lymphoma. PPD skin test was negative.  ACE >100 01/30/12 Biopsy right cervical node 01/23/2012/[facility]- non- necrotizing granulomatous inflammation negative for organisms. ACE level 07/14/15-  62 ( 53, 58 1 yr ago) PFT 08/19/17-difusion slightly reduced.  FVC 2.84/89%, FEV1 2.62/99%, ratio 1.92, TLC 81%, DLCO 75% -------------------------------------------------------------------------------------------------------  09/13/2015-42 year old female never smoker followed for sarcoid/node biopsy positive, urticaria Follows for: Sarcoidosis. Pt c/o swelling around her eyes that occurs overnight. Pt states that she wakes up with bilateral eye swelling and discharge. Pt reports symptoms x1 week. Pt is also concerned about increasing weight loss as she has lost >10lbs in recent months. Pt also c/o several episodes of bilateral hand/feet swelling. Pt also reports recent memory issues.  Labs 03/08/2015- Nl BMET, normal LFT, ACE up 87 Concerned that she is losing weight despite normal appetite. Not clear she really knows how much she is eating. Complains eyelids are puffy 1 week without itching or blurring Asks referral to [facility] to reassess because of past diagnosis of OCD  08/19/17- 42 year old female never smoker followed for sarcoid/node biopsy positive, urticaria Had sustained prednisone for exacerbation in December 2017.   ACE level on 06/25/16-  73  (  9-67) ----PFTs today, wheezing, edema of eyelids (has been to eye Dr.), leg pain Right  PFT 08/19/17-difusion slightly reduced.  FVC 2.84/89%, FEV1 2.62/99%, ratio 1.92, TLC 81%, DLCO 75% Working as a Art therapist.  Standing all day is associated with sciatica right hip for the past 9 or 10 months.  Chest feels fine but wakes with a cough productive clear phlegm in the mornings.  Admits to smoking pot once daily.  Not pregnant.  Diabetic.  Has noted a couple of hyperpigmented spots but no rash or adenopathy, night sweats or fever. CXR 08/26/15- IMPRESSION: Unchanged appearance of the chest.  Chronic changes of sarcoidosis.  ROS-see HPI  + = positive Constitutional:   weight loss,? night sweats,No- fevers, chills, fatigue, lassitude. HEENT:   No-  headaches, difficulty swallowing, tooth/dental problems, sore throat,       No-  sneezing, itching, ear ache, nasal congestion, post nasal drip,  CV:  No-   chest pain, orthopnea, PND, swelling in lower extremities, anasarca, dizziness, palpitations Resp: +shortness of breath with exertion or at rest.            +   productive cough,  No-non-productive cough,  No- coughing up of blood.              No-   change in color of mucus.  No- wheezing.   Skin:    no- rash/ hives  GI:  No-   heartburn, indigestion, abdominal pain, nausea, vomiting,  GU:  MS:  No- more  joint pain or swelling. . Neuro-     nothing unusual Psych:  No-change in mood or affect. + depression or anxiety.  No memory loss.  OBJ- Physical Exam General- Alert, Oriented, Affect-appropriate, Distress- none acute.  Skin- clear with no visible rash Lymphadenopathy- none Head- atraumatic  Eyes- Gross vision intact, PERRLA, conjunctivae and secretions clear. + Eyes look normal            Ears- Hearing, canals-normal            Nose- Clear, no-Septal dev, mucus, polyps, erosion, perforation             Throat- Mallampati II , mucosa clear , drainage- none, tonsils-  atrophic. Torus Neck- flexible , trachea midline, no stridor , thyroid nl, carotid no bruit Chest - symmetrical excursion , unlabored           Heart/CV- RRR , no murmur , no gallop  , no rub, nl s1 s2                           - JVD- none , edema- none, stasis changes- none, varices- none           Lung-  Cough- none, dullness-none, rub- none           Chest wall-  Abd-  Br/ Gen/ Rectal- Not done, not indicated Extrem- cyanosis- none, clubbing, none, atrophy- none, strength- nl Neuro- grossly intact to observation

## 2017-08-19 NOTE — Progress Notes (Signed)
PFT completed today 08/19/17

## 2017-08-19 NOTE — Patient Instructions (Signed)
Order- lab- CBC w diff, BMET, Hepatic panel, ACE level     Dx sarcoid   Order Xray lumbo-sacral spine   Dx sciatica pain  Order- referral to ophthalmologist- Dr Gershon Crane or Dr Katy Fitch  Sample Arnuity  Inhale 2 puffs, then rinse mouth, twice daily

## 2017-08-20 LAB — ANGIOTENSIN CONVERTING ENZYME: Angiotensin-Converting Enzyme: 41 U/L (ref 9–67)

## 2017-08-21 ENCOUNTER — Telehealth: Payer: Self-pay | Admitting: Internal Medicine

## 2017-08-21 NOTE — Telephone Encounter (Signed)
Called pt who was very unhappy when I was speaking to her on the phone.  Pt stated she was tired of waiting to hear from [place] about taking care of the referral for her to see a doctor at [facility].  Pt stated she took it upon herself and called [facility] and made an appt there for a consult and has an appt Monday, 08/26/17 at 9:30.  Pt aggravagatingly stated to me that she had always been called at the wrong number and that was why she did not wait to hear from [place] regarding a consult and called and got an appt scheduled herself. Pt said we were needing to get a disc of her xray studies to [facility] before her appt on Monday.  I called xray to see if they were able to get a disc of xray and was told that needed to come from medical records.  Called medical records and spoke with Janett Billow who stated [facility] needed to send the request on letterhead faxed over and then they could take care of getting the disc to the proper place.  Called [facility], spoke with Ena Dawley who stated since we are in [place], a disc was not needed that they could pull up images once pt comes in and that she would take care of calling pt letting her know this information.  Nothing further needed at this current time.

## 2017-08-21 NOTE — Telephone Encounter (Signed)
Pt needs he x-ray disk sent over to [facility]   [phone]   Pt call her at [phone]  She is very upset and aggravated

## 2017-08-28 ENCOUNTER — Telehealth: Payer: Self-pay | Admitting: Internal Medicine

## 2017-08-28 NOTE — Telephone Encounter (Signed)
Called pt on her work phone giving her the results of her labwork. Pt expressed understanding. Nothing further needed at this current time.

## 2017-09-30 ENCOUNTER — Ambulatory Visit: Payer: BLUE CROSS/BLUE SHIELD | Admitting: Internal Medicine

## 2017-10-07 DIAGNOSIS — M5431 Sciatica, right side: Secondary | ICD-10-CM | POA: Insufficient documentation

## 2017-10-07 NOTE — Assessment & Plan Note (Signed)
Not sure if any of her symptoms reflect sarcoid activity. Plan-labs were ACE level, chemistries.  Try sample Arnuity 200, 1 puff once daily.  See ophthalmologist for complaint of swelling around eyes.

## 2017-10-07 NOTE — Assessment & Plan Note (Addendum)
Probably some degenerative/disc compression problems lumbosacral spine. Plan-x-ray lumbosacral spine, she will discussed with PCP for appropriate referral as needed.

## 2017-10-28 ENCOUNTER — Other Ambulatory Visit: Payer: Self-pay

## 2017-10-28 ENCOUNTER — Other Ambulatory Visit (HOSPITAL_COMMUNITY)
Admission: RE | Admit: 2017-10-28 | Discharge: 2017-10-28 | Disposition: A | Discharge status: Home / Self Care | Payer: BLUE CROSS/BLUE SHIELD | Source: Ambulatory Visit | Attending: Obstetrics and Gynecology | Admitting: Obstetrics and Gynecology

## 2017-10-28 ENCOUNTER — Ambulatory Visit: Payer: BLUE CROSS/BLUE SHIELD | Admitting: Obstetrics and Gynecology

## 2017-10-28 ENCOUNTER — Encounter: Payer: Self-pay | Admitting: Obstetrics and Gynecology

## 2017-10-28 VITALS — BP 102/60 | HR 88 | Resp 14 | Ht 64.5 in | Wt 148.0 lb

## 2017-10-28 DIAGNOSIS — Z23 Encounter for immunization: Secondary | ICD-10-CM

## 2017-10-28 DIAGNOSIS — Z124 Encounter for screening for malignant neoplasm of cervix: Secondary | ICD-10-CM | POA: Diagnosis present

## 2017-10-28 DIAGNOSIS — Z8742 Personal history of other diseases of the female genital tract: Secondary | ICD-10-CM

## 2017-10-28 DIAGNOSIS — Z0000 Encounter for general adult medical examination without abnormal findings: Secondary | ICD-10-CM

## 2017-10-28 DIAGNOSIS — Z113 Encounter for screening for infections with a predominantly sexual mode of transmission: Secondary | ICD-10-CM | POA: Diagnosis present

## 2017-10-28 DIAGNOSIS — N920 Excessive and frequent menstruation with regular cycle: Secondary | ICD-10-CM

## 2017-10-28 DIAGNOSIS — Z7185 Encounter for immunization safety counseling: Secondary | ICD-10-CM

## 2017-10-28 DIAGNOSIS — N946 Dysmenorrhea, unspecified: Secondary | ICD-10-CM

## 2017-10-28 DIAGNOSIS — Z01419 Encounter for gynecological examination (general) (routine) without abnormal findings: Secondary | ICD-10-CM

## 2017-10-28 DIAGNOSIS — N852 Hypertrophy of uterus: Secondary | ICD-10-CM

## 2017-10-28 DIAGNOSIS — E559 Vitamin D deficiency, unspecified: Secondary | ICD-10-CM

## 2017-10-28 DIAGNOSIS — Z7189 Other specified counseling: Secondary | ICD-10-CM

## 2017-10-28 NOTE — Patient Instructions (Addendum)
EXERCISE AND DIET:  We recommended that you start or continue a regular exercise program for good health. Regular exercise means any activity that makes your heart beat faster and makes you sweat.  We recommend exercising at least 30 minutes per day at least 3 days a week, preferably 4 or 5.  We also recommend a diet low in fat and sugar.  Inactivity, poor dietary choices and obesity can cause diabetes, heart attack, stroke, and kidney damage, among others.    ALCOHOL AND SMOKING:  Women should limit their alcohol intake to no more than 7 drinks/beers/glasses of wine (combined, not each!) per week. Moderation of alcohol intake to this level decreases your risk of breast cancer and liver damage. And of course, no recreational drugs are part of a healthy lifestyle.  And absolutely no smoking or even second hand smoke. Most people know smoking can cause heart and lung diseases, but did you know it also contributes to weakening of your bones? Aging of your skin?  Yellowing of your teeth and nails?  CALCIUM AND VITAMIN D:  Adequate intake of calcium and Vitamin D are recommended.  The recommendations for exact amounts of these supplements seem to change often, but generally speaking 600 mg of calcium (either carbonate or citrate) and 800 units of Vitamin D per day seems prudent. Certain women may benefit from higher intake of Vitamin D.  If you are among these women, your doctor will have told you during your visit.    PAP SMEARS:  Pap smears, to check for cervical cancer or precancers,  have traditionally been done yearly, although recent scientific advances have shown that most women can have pap smears less often.  However, every woman still should have a physical exam from her gynecologist every year. It will include a breast check, inspection of the vulva and vagina to check for abnormal growths or skin changes, a visual exam of the cervix, and then an exam to evaluate the size and shape of the uterus and  ovaries.  And after 40 years of age, a rectal exam is indicated to check for rectal cancers. We will also provide age appropriate advice regarding health maintenance, like when you should have certain vaccines, screening for sexually transmitted diseases, bone density testing, colonoscopy, mammograms, etc.   MAMMOGRAMS:  All women over 40 years old should have a yearly mammogram. Many facilities now offer a "3D" mammogram, which may cost around $50 extra out of pocket. If possible,  we recommend you accept the option to have the 3D mammogram performed.  It both reduces the number of women who will be called back for extra views which then turn out to be normal, and it is better than the routine mammogram at detecting truly abnormal areas.    COLONOSCOPY:  Colonoscopy to screen for colon cancer is recommended for all women at age 50.  We know, you hate the idea of the prep.  We agree, BUT, having colon cancer and not knowing it is worse!!  Colon cancer so often starts as a polyp that can be seen and removed at colonscopy, which can quite literally save your life!  And if your first colonoscopy is normal and you have no family history of colon cancer, most women don't have to have it again for 10 years.  Once every ten years, you can do something that may end up saving your life, right?  We will be happy to help you get it scheduled when you are ready.    Be sure to check your insurance coverage so you understand how much it will cost.  It may be covered as a preventative service at no cost, but you should check your particular policy.      Breast Self-Awareness Breast self-awareness means being familiar with how your breasts look and feel. It involves checking your breasts regularly and reporting any changes to your health care provider. Practicing breast self-awareness is important. A change in your breasts can be a sign of a serious medical problem. Being familiar with how your breasts look and feel allows  you to find any problems early, when treatment is more likely to be successful. All women should practice breast self-awareness, including women who have had breast implants. How to do a breast self-exam One way to learn what is normal for your breasts and whether your breasts are changing is to do a breast self-exam. To do a breast self-exam: Look for Changes  1. Remove all the clothing above your waist. 2. Stand in front of a mirror in a room with good lighting. 3. Put your hands on your hips. 4. Push your hands firmly downward. 5. Compare your breasts in the mirror. Look for differences between them (asymmetry), such as: ? Differences in shape. ? Differences in size. ? Puckers, dips, and bumps in one breast and not the other. 6. Look at each breast for changes in your skin, such as: ? Redness. ? Scaly areas. 7. Look for changes in your nipples, such as: ? Discharge. ? Bleeding. ? Dimpling. ? Redness. ? A change in position. Feel for Changes  Carefully feel your breasts for lumps and changes. It is best to do this while lying on your back on the floor and again while sitting or standing in the shower or tub with soapy water on your skin. Feel each breast in the following way:  Place the arm on the side of the breast you are examining above your head.  Feel your breast with the other hand.  Start in the nipple area and make  inch (2 cm) overlapping circles to feel your breast. Use the pads of your three middle fingers to do this. Apply light pressure, then medium pressure, then firm pressure. The light pressure will allow you to feel the tissue closest to the skin. The medium pressure will allow you to feel the tissue that is a little deeper. The firm pressure will allow you to feel the tissue close to the ribs.  Continue the overlapping circles, moving downward over the breast until you feel your ribs below your breast.  Move one finger-width toward the center of the body.  Continue to use the  inch (2 cm) overlapping circles to feel your breast as you move slowly up toward your collarbone.  Continue the up and down exam using all three pressures until you reach your armpit.  Write Down What You Find  Write down what is normal for each breast and any changes that you find. Keep a written record with breast changes or normal findings for each breast. By writing this information down, you do not need to depend only on memory for size, tenderness, or location. Write down where you are in your menstrual cycle, if you are still menstruating. If you are having trouble noticing differences in your breasts, do not get discouraged. With time you will become more familiar with the variations in your breasts and more comfortable with the exam. How often should I examine my breasts? Examine   your breasts every month. If you are breastfeeding, the best time to examine your breasts is after a feeding or after using a breast pump. If you menstruate, the best time to examine your breasts is 5-7 days after your period is over. During your period, your breasts are lumpier, and it may be more difficult to notice changes. When should I see my health care provider? See your health care provider if you notice:  A change in shape or size of your breasts or nipples.  A change in the skin of your breast or nipples, such as a reddened or scaly area.  Unusual discharge from your nipples.  A lump or thick area that was not there before.  Pain in your breasts.  Anything that concerns you.  This information is not intended to replace advice given to you by your health care provider. Make sure you discuss any questions you have with your health care provider. Document Released: 07/16/2005 Document Revised: 12/22/2015 Document Reviewed: 06/05/2015 Elsevier Interactive Patient Education  2018 Elsevier Inc.  Oral Contraception Information Oral contraceptive pills (OCPs) are medicines  taken to prevent pregnancy. OCPs work by preventing the ovaries from releasing eggs. The hormones in OCPs also cause the cervical mucus to thicken, preventing the sperm from entering the uterus. The hormones also cause the uterine lining to become thin, not allowing a fertilized egg to attach to the inside of the uterus. OCPs are highly effective when taken exactly as prescribed. However, OCPs do not prevent sexually transmitted diseases (STDs). Safe sex practices, such as using condoms along with the pill, can help prevent STDs. Before taking the pill, you may have a physical exam and Pap test. Your health care provider may order blood tests. The health care provider will make sure you are a good candidate for oral contraception. Discuss with your health care provider the possible side effects of the OCP you may be prescribed. When starting an OCP, it can take 2 to 3 months for the body to adjust to the changes in hormone levels in your body. Types of oral contraception  The combination pill-This pill contains estrogen and progestin (synthetic progesterone) hormones. The combination pill comes in 21-day, 28-day, or 91-day packs. Some types of combination pills are meant to be taken continuously (365-day pills). With 21-day packs, you do not take pills for 7 days after the last pill. With 28-day packs, the pill is taken every day. The last 7 pills are without hormones. Certain types of pills have more than 21 hormone-containing pills. With 91-day packs, the first 84 pills contain both hormones, and the last 7 pills contain no hormones or contain estrogen only.  The minipill-This pill contains the progesterone hormone only. The pill is taken every day continuously. It is very important to take the pill at the same time each day. The minipill comes in packs of 28 pills. All 28 pills contain the hormone. Advantages of oral contraceptive pills  Decreases premenstrual symptoms.  Treats menstrual period  cramps.  Regulates the menstrual cycle.  Decreases a heavy menstrual flow.  May treatacne, depending on the type of pill.  Treats abnormal uterine bleeding.  Treats polycystic ovarian syndrome.  Treats endometriosis.  Can be used as emergency contraception. Things that can make oral contraceptive pills less effective OCPs can be less effective if:  You forget to take the pill at the same time every day.  You have a stomach or intestinal disease that lessens the absorption of the pill.    You take OCPs with other medicines that make OCPs less effective, such as antibiotics, certain HIV medicines, and some seizure medicines.  You take expired OCPs.  You forget to restart the pill on day 7, when using the packs of 21 pills.  Risks associated with oral contraceptive pills Oral contraceptive pills can sometimes cause side effects, such as:  Headache.  Nausea.  Breast tenderness.  Irregular bleeding or spotting.  Combination pills are also associated with a small increased risk of:  Blood clots.  Heart attack.  Stroke.  This information is not intended to replace advice given to you by your health care provider. Make sure you discuss any questions you have with your health care provider. Document Released: 10/06/2002 Document Revised: 12/22/2015 Document Reviewed: 01/04/2013 Elsevier Interactive Patient Education  2018 Elsevier Inc.  

## 2017-10-28 NOTE — Progress Notes (Addendum)
42 y.o. G3P2012 SingleAfrican AmericanF here for annual exam.  Cycles are every 30 days x 7-9 days (always). Passes huge clots the size of tennis balls. The heavy bleeding lasts for 2-3 days. Can saturate a maxi pad every 1.5 hours. Cramps are tolerable. She was treated with prednisone for her sarcoidosis, which helped her endometriosis. Doesn't use NSAID's.  She is drinking marijuana and it has helped a lot of her ailments (helps her sarcoidosis, discomfort and insomnia). Takes it at bedtime.  Period Cycle (Days): 30 Period Duration (Days): 7-9  Period Pattern: (!) Irregular Menstrual Flow: Heavy Menstrual Control: Maxi pad Menstrual Control Change Freq (Hours): every 2 hours Dysmenorrhea: (!) Moderate Dysmenorrhea Symptoms: Throbbing(back pain )  Sexually active, same partner on and off x 13 years. Living together. No dyspareunia.  She had a ?mirena IUD years ago, didn't like it. Had vomiting with OCP's when she was 19. With her sarcoidosis she has a cough, breathing funny in the am.   Patient's last menstrual period was 10/06/2016.          Sexually active: Yes.    The current method of family planning is condoms  Exercising: No.  The patient does not participate in regular exercise at present. Smoker:  no  Health Maintenance: Pap:  07/2016 [facility], 10/26/13 negative History of abnormal Pap:  Yes, h/o surgery on her cervix (?LEEP) MMG:  ?2016 with Dr. Jackson- Moore  Colonoscopy:  Never  BMD:   never TDaP:  Unsure     reports that she has never smoked. She has never used smokeless tobacco. She reports that she drinks about 0.6 oz of alcohol per week. She reports that she has current or past drug history. Drug: Marijuana.  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 2 kids, 18 year old daughter (in her second year at [employer], studying biology), 16 year old son (10th grade)  Past Medical  History:  Diagnosis Date  . Endometriosis   . Hyperlipidemia   . Sarcoid     Past Surgical History:  Procedure Laterality Date  . CESAREAN SECTION    . LAPAROTOMY  2003   missing one tube and ovary? Endometriosis  . NECK SURGERY  01-24-12   removed lymph node    Current Outpatient Medications  Medication Sig Dispense Refill  . cyclobenzaprine (FLEXERIL) 5 MG tablet   0   No current facility-administered medications for this visit.     Family History  Problem Relation Age of Onset  . Asthma Father        childhood-smoker  . Asthma Sister   . Asthma Brother   . Diabetes Paternal Grandmother   . Breast cancer Maternal Aunt   . Diabetes Maternal Grandmother   . Hypertension Maternal Grandmother   . Endometriosis Cousin     Review of Systems  Constitutional: Negative.   HENT: Negative.   Eyes: Negative.   Respiratory: Negative.   Cardiovascular: Negative.   Gastrointestinal: Negative.   Endocrine: Negative.   Genitourinary: Negative.   Musculoskeletal: Negative.   Skin: Negative.   Allergic/Immunologic: Negative.   Neurological: Negative.   Hematological: Negative.   Psychiatric/Behavioral: Negative.     Exam:   BP 102/60 (BP Location: Right Arm, Patient Position: Sitting, Cuff Size: Normal)   Pulse 88   Resp 14   Ht 5' 4.5" (1.638 m)   Wt 148 lb (67.1 kg)   LMP 10/06/2016     BMI 25.01 kg/m   Weight change: @WEIGHTCHANGE@ Height:   Height: 5' 4.5" (163.8 cm)  Ht Readings from Last 3 Encounters:  10/28/17 5' 4.5" (1.638 m)  08/19/17 5' 5" (1.651 m)  07/24/16 5' 5" (1.651 m)    General appearance: alert, cooperative and appears stated age Head: Normocephalic, without obvious abnormality, atraumatic Neck: no adenopathy, supple, symmetrical, trachea midline and thyroid normal to inspection and palpation Lungs: clear to auscultation bilaterally Cardiovascular: regular rate and rhythm Breasts: normal appearance, no masses or tenderness Abdomen: soft,  non-tender; non distended,  no masses,  no organomegaly Extremities: extremities normal, atraumatic, no cyanosis or edema Skin: Skin color, texture, turgor normal. No rashes or lesions Lymph nodes: Cervical, supraclavicular, and axillary nodes normal. No abnormal inguinal nodes palpated Neurologic: Grossly normal   Pelvic: External genitalia:  no lesions              Urethra:  normal appearing urethra with no masses, tenderness or lesions              Bartholins and Skenes: normal                 Vagina: normal appearing vagina with normal color and discharge, no lesions              Cervix: no lesions and difficult to visualize, could only see posterior lip. Under the pubic syphsis                Bimanual Exam:  Uterus:  retroverted, slightly enlarged and irregular              Adnexa: no mass, fullness, tenderness               Rectovaginal: Confirms               Anus:  normal sphincter tone, no lesions  Chaperone was present for exam.  A:  Well Woman with normal exam  Vit d def  Screening STD  Menorrhagia  Enlarged uterus  P:   Pap with hpv  Discussed breast self exam  Discussed calcium and vit D intake  Tubal for contraception  Screening STD  Vit d  Lipids (other labs okay)   Return for GYN ultrasound  She hasn't tolerated the ?mirena IUD in the past  Consider OCP's, information given. Will further discuss after her evaluation is complete        

## 2017-10-29 LAB — LIPID PANEL
CHOL/HDL RATIO: 2.9 ratio (ref 0.0–4.4)
Cholesterol, Total: 163 mg/dL (ref 100–199)
HDL: 56 mg/dL (ref >39)
LDL CALC: 90 mg/dL (ref 0–99)
Triglycerides: 86 mg/dL (ref 0–149)
VLDL Cholesterol Cal: 17 mg/dL (ref 5–40)

## 2017-10-29 LAB — HEP, RPR, HIV PANEL
HEP B S AG: NEGATIVE
HIV Screen 4th Generation wRfx: NONREACTIVE
RPR: NONREACTIVE

## 2017-10-29 LAB — CYTOLOGY - PAP
Chlamydia: NEGATIVE
Diagnosis: NEGATIVE
HPV (WINDOPATH): NOT DETECTED
NEISSERIA GONORRHEA: NEGATIVE

## 2017-10-29 LAB — VITAMIN D 25 HYDROXY (VIT D DEFICIENCY, FRACTURES): Vit D, 25-Hydroxy: 13.7 ng/mL — ABNORMAL LOW (ref 30.0–100.0)

## 2017-10-29 LAB — CBC
HEMATOCRIT: 34.5 % (ref 34.0–46.6)
Hemoglobin: 11.3 g/dL (ref 11.1–15.9)
MCH: 26.8 pg (ref 26.6–33.0)
MCHC: 32.8 g/dL (ref 31.5–35.7)
MCV: 82 fL (ref 79–97)
PLATELETS: 377 10*3/uL (ref 150–379)
RBC: 4.21 x10E6/uL (ref 3.77–5.28)
RDW: 15.7 % — AB (ref 12.3–15.4)
WBC: 6.6 10*3/uL (ref 3.4–10.8)

## 2017-10-29 LAB — FERRITIN: Ferritin: 16 ng/mL (ref 15–150)

## 2017-10-29 LAB — HEPATITIS C ANTIBODY: Hep C Virus Ab: <0.1 s/co ratio (ref 0.0–0.9)

## 2017-10-30 ENCOUNTER — Telehealth: Payer: Self-pay | Admitting: Obstetrics and Gynecology

## 2017-10-30 ENCOUNTER — Telehealth: Payer: Self-pay

## 2017-10-30 NOTE — Telephone Encounter (Signed)
Patient returned call. Spoke with patient regarding benefit for recommended ultrasound. Patient understood and agreeable. Patient requested to schedule in May, as stating she "has already taken too many days off in April." Patient scheduled 12/03/17 with Dr Talbert Nan. Patient aware of appointment date, arrival time and cancellation policy. No further questions.   Will close encounter  Forwarding to Dr Talbert Nan for final review

## 2017-10-30 NOTE — Telephone Encounter (Signed)
-----   Message from Salvadore Dom, MD sent at 10/29/2017  5:17 PM EDT ----- Please let the patient know that her vit d level is very low and treat with 50,000 IU of vit d 3, 1 x a week for 12 weeks. Then she needs a f/u vit d level. She is not anemic, but has a low normal hgb and ferritin (iron stores). She should increase the iron in her diet or take a supplement. The rest of her lab work including her pap, HPV and std testing was negative.

## 2017-10-30 NOTE — Telephone Encounter (Signed)
Call placed to patient to review benefits for a recommended ultrasound procedure. Left voicemail message requesting a return call.

## 2017-10-30 NOTE — Telephone Encounter (Signed)
Left message to call Kaitlyn at [phone]. 

## 2017-11-04 ENCOUNTER — Encounter: Payer: Self-pay | Admitting: Internal Medicine

## 2017-11-04 ENCOUNTER — Ambulatory Visit (INDEPENDENT_AMBULATORY_CARE_PROVIDER_SITE_OTHER): Payer: BLUE CROSS/BLUE SHIELD | Admitting: Internal Medicine

## 2017-11-04 VITALS — BP 134/72 | HR 74 | Ht 65.0 in | Wt 142.0 lb

## 2017-11-04 DIAGNOSIS — M5431 Sciatica, right side: Secondary | ICD-10-CM | POA: Diagnosis not present

## 2017-11-04 DIAGNOSIS — M5430 Sciatica, unspecified side: Secondary | ICD-10-CM | POA: Diagnosis not present

## 2017-11-04 DIAGNOSIS — F5101 Primary insomnia: Secondary | ICD-10-CM

## 2017-11-04 DIAGNOSIS — D869 Sarcoidosis, unspecified: Secondary | ICD-10-CM | POA: Diagnosis not present

## 2017-11-04 MED ORDER — TRAZODONE HCL 50 MG PO TABS: ORAL_TABLET | ORAL | 5 refills | Status: DC

## 2017-11-04 NOTE — Assessment & Plan Note (Addendum)
CXR reviewed with her showing stage II sarcoid changes.  Clinically this is  inactive, with normal ACE level.  We will follow at long intervals.

## 2017-11-04 NOTE — Assessment & Plan Note (Addendum)
Her biggest problem is low back pain but we discussed options and she can try trazodone as discussed.  Reviewed sleep hygiene. Plan-trazodone

## 2017-11-04 NOTE — Progress Notes (Signed)
HPI female never smoker followed for sarcoid/node biopsy positive, urticaria Had sustained prednisone for exacerbation in December 2017  CT scan of chest the 01/22/2012 showed focal airspace disease right upper lobe. Peri-bronchovascular nodular opacity was also seen at the left hilum and more confluent disease in the left lower lobe. There is widespread mediastinal adenopathy. Imaging of the abdomen demonstrated splenomegaly with miliary lesions in the liver and abdominal adenopathy. Differential diagnosis was between sarcoid and lymphoma. PPD skin test was negative.  ACE >100 01/30/12 Biopsy right cervical node 01/23/2012/[facility]- non- necrotizing granulomatous inflammation negative for organisms. ACE level 07/14/15-  62 ( 53, 58 1 yr ago) PFT 08/19/17-difusion slightly reduced.  FVC 2.84/89%, FEV1 2.62/99%, ratio 1.92, TLC 81%, DLCO 75% -------------------------------------------------------------------------------------------------------  08/19/17- 42 year old female never smoker followed for sarcoid/node biopsy positive, urticaria Had sustained prednisone for exacerbation in December 2017.   ACE level on 06/25/16-  73  ( 9-67) ----PFTs today, wheezing, edema of eyelids (has been to eye Dr.), leg pain Right  PFT 08/19/17-difusion slightly reduced.  FVC 2.84/89%, FEV1 2.62/99%, ratio 1.92, TLC 81%, DLCO 75% Working as a Art therapist.  Standing all day is associated with sciatica right hip for the past 9 or 10 months.  Chest feels fine but wakes with a cough productive clear phlegm in the mornings.  Admits to smoking pot once daily.  Not pregnant.  Diabetic.  Has noted a couple of hyperpigmented spots but no rash or adenopathy, night sweats or fever. CXR 08/26/15- IMPRESSION: Unchanged appearance of the chest.  Chronic changes of sarcoidosis.  11/04/2017- 42 year old female pot smoker followed for sarcoid/node biopsy positive, urticaria Had sustained prednisone for exacerbation in  December 2017.  ----FOLLOWS FOR: review pft.  c/o increased prod cough with clear mucus.   PFT 08/19/2017-minimal diffusion deficit.  No response to bronchodilator.  FVC 2.84/89%, FEV1 2.62/99%, ratio 0.92, FEF 25-75% 3.80/131%, TLC 81%, DLCO 75% ACE 08/19/17--41 Persistent morning cough with clear phlegm, no fever.  Changed from smoking pot to drinking it from a blender to help with chronic disc disease pain.  She had been seen in orthopedics with no follow-up.  Says they did not respond to her phone calls. Temazepam help sleep quality only temporarily. Ophthalmology/Dr. Gershon Crane has given her an ointment to help eyes.  Apparently there might be some sarcoid component to her eye irritation. Her primary complaint low back pain with right sciatica.  She says she cannot afford surgery.  ROS-see HPI  + = positive Constitutional:   weight loss,? night sweats,No- fevers, chills, fatigue, lassitude. HEENT:   No-  headaches, difficulty swallowing, tooth/dental problems, sore throat,       No-  sneezing, itching, ear ache, nasal congestion, post nasal drip,  CV:  No-   chest pain, orthopnea, PND, swelling in lower extremities, anasarca, dizziness, palpitations Resp: +shortness of breath with exertion or at rest.            +   productive cough,  No-non-productive cough,  No- coughing up of blood.              No-   change in color of mucus.  No- wheezing.   Skin:    no- rash/ hives  GI:  No-   heartburn, indigestion, abdominal pain, nausea, vomiting,  GU:  MS:     joint pain or swelling. + low back pain/ sciatica Neuro-     nothing unusual Psych:  No-change in mood or affect. + depression or anxiety.  No memory loss.  OBJ- Physical Exam General- Alert, Oriented, Affect-appropriate, Distress- none acute.  Skin- clear with no visible rash Lymphadenopathy- none Head- atraumatic            Eyes- Gross vision intact, PERRLA, conjunctivae and secretions clear. + Eyes look normal            Ears-  Hearing, canals-normal            Nose- Clear, no-Septal dev, mucus, polyps, erosion, perforation             Throat- Mallampati II , mucosa clear , drainage- none, tonsils- atrophic. Torus Neck- flexible , trachea midline, no stridor , thyroid nl, carotid no bruit Chest - symmetrical excursion , unlabored           Heart/CV- RRR , no murmur , no gallop  , no rub, nl s1 s2                           - JVD- none , edema- none, stasis changes- none, varices- none           Lung-  Cough- none, dullness-none, rub- none           Chest wall-  Abd-  Br/ Gen/ Rectal- Not done, not indicated Extrem- cyanosis- none, clubbing, none, atrophy- none, strength- nl Neuro- grossly intact to observation

## 2017-11-04 NOTE — Patient Instructions (Addendum)
Order- referral to Sports Medicine Dr Earnest Conroy.Smith     Dx degenerative disk disease, sciatica   Script for trazodone to help sleep

## 2017-11-04 NOTE — Assessment & Plan Note (Signed)
Not sure if physical therapy could help this.  She says she cannot afford surgery. Plan-referral to Dr. [facility]

## 2017-11-07 MED ORDER — VITAMIN D (ERGOCALCIFEROL) 1.25 MG (50000 UNIT) PO CAPS: 50000.0000 [IU] | ORAL_CAPSULE | ORAL | 0 refills | Status: DC

## 2017-11-07 NOTE — Telephone Encounter (Signed)
Spoke with patient. Results given. Patient verbalizes understanding. Rx for Vitamin D 50000 IU weekly #12 0RF sent to pharmacy on file. Patient wants to cancel her next Gardasil injection. Does not wish to reschedule or proceed with injections. Reports her arm is still sore and does not want to place more chemicals into her body. Appointment cancelled. Requests to reschedule PUS due to being out of work in May for other appointments. PUS moved to 01/14/2018 at 4 pm with 4:30 pm consult with Dr.Jertson. Want to have Vitamin D recheck at that appointment. Patient is agreeable to date and time.  Routing to provider for final review. Patient agreeable to disposition. Will close encounter.

## 2017-11-24 NOTE — Progress Notes (Signed)
[name] [address] [place], [place] [address] Phone: [phone] Subjective:    I'm seeing this patient by the request  of:  Baird Lyons MD  CC:  Back pain   [id number]  Sara Nelson is a 42 y.o. female coming in with complaint of back pain. Has seen another provider. They wanted to get a CT scan of her back. Patient has been in pain for 2 years. History of 3 herniated discs. Pain is in right glute that radiates down her right leg. Has a hard time sleeping at night. Patient has relief in child's pose position.  Patient states that unfortunately continues to go down her leg on a regular basis.  Patient rates the severity of pain is 5 out of 10.  Patient states that it is affecting daily activities.  Continue to wake her up at night.  Denies any fevers, chills, any abnormal weight loss.     Past Medical History:  Diagnosis Date  . Endometriosis   . Hyperlipidemia   . Sarcoid    Past Surgical History:  Procedure Laterality Date  . CESAREAN SECTION    . LAPAROTOMY  2003   missing one tube and ovary? Endometriosis  . NECK SURGERY  01-24-12   removed lymph node   Social History   Socioeconomic History  . Marital status: Single    Spouse name: Not on file  . Number of children: 2  . Years of education: Not on file  . Highest education level: Not on file  Occupational History  . Occupation: Art therapist  Social Needs  . Financial resource strain: Not on file  . Food insecurity:    Worry: Not on file    Inability: Not on file  . Transportation needs:    Medical: Not on file    Non-medical: Not on file  Tobacco Use  . Smoking status: Never Smoker  . Smokeless tobacco: Never Used  . Tobacco comment: patient smokes THC every night   Substance and Sexual Activity  . Alcohol use: Yes    Alcohol/week: 0.6 oz    Types: 1 Standard drinks or equivalent per week  . Drug use: Yes    Types: Marijuana    Comment: brews the marijuana  in her kerig and drinks   . Sexual activity: Yes    Partners: Male    Birth control/protection: Condom  Lifestyle  . Physical activity:    Days per week: Not on file    Minutes per session: Not on file  . Stress: Not on file  Relationships  . Social connections:    Talks on phone: Not on file    Gets together: Not on file    Attends religious service: Not on file    Active member of club or organization: Not on file    Attends meetings of clubs or organizations: Not on file    Relationship status: Not on file  Other Topics Concern  . Not on file  Social History Narrative  . Not on file   Allergies  Allergen Reactions  . Ceftin [Cefuroxime Axetil] Shortness Of Breath  . Latex    Family History  Problem Relation Age of Onset  . Asthma Father        childhood-smoker  . Asthma Sister   . Asthma Brother   . Diabetes Paternal Grandmother   . Breast cancer Maternal Aunt   . Diabetes Maternal Grandmother   . Hypertension Maternal Grandmother   .  Endometriosis Cousin      Past medical history, social, surgical and family history all reviewed in electronic medical record.  No pertanent information unless stated regarding to the chief complaint.   Review of Systems:Review of systems updated and as accurate as of 11/25/17  No headache, visual changes, nausea, vomiting, diarrhea, constipation, dizziness, abdominal pain, skin rash, fevers, chills, night sweats, weight loss, swollen lymph nodes, body aches, joint swelling, , chest pain, shortness of breath, mood changes.  Positive muscle aches  Objective  Blood pressure 112/74, pulse 89, height 5\' 5"  (1.651 m), weight 142 lb (64.4 kg), SpO2 98 %. Systems examined below as of 11/25/17   General: No apparent distress alert and oriented x3 mood and affect normal, dressed appropriately.  HEENT: Pupils equal, extraocular movements intact  Respiratory: Patient's speak in full sentences and does not appear short of breath    Cardiovascular: No lower extremity edema, non tender, no erythema  Skin: Warm dry intact with no signs of infection or rash on extremities or on axial skeleton.  Abdomen: Soft nontender  Neuro: Cranial nerves II through XII are intact, neurovascularly intact in all extremities with 2+ DTRs and 2+ pulses.  Lymph: No lymphadenopathy of posterior or anterior cervical chain or axillae bilaterally.  Gait normal with good balance and coordination.  MSK:  Non tender with full range of motion and good stability and symmetric strength and tone of shoulders, elbows, wrist, hip, knee and ankles bilaterally.  Back Exam:  Inspection: Mild loss of lordosis Motion: Flexion 30 deg with worsening pain, Extension 25 deg, Side Bending to 35 deg bilaterally,  Rotation to 40 deg bilaterally  SLR laying: Positive right 20 degrees of forward flexion. XSLR laying: Negative  Palpable tenderness: Tender to palpation the paraspinal musculature lumbar spine right greater than. FABER: Severe tightness on the right side. Sensory change: Gross sensation intact to all lumbar and sacral dermatomes.  Reflexes: 2+ at both patellar tendons, 2+ at achilles tendons, Babinski's downgoing.  Strength at foot  Plantar-flexion: 5/5 Dorsi-flexion: 5/5 Eversion: 5/5 Inversion: 5/5  Leg strength  Quad: 5/5 Hamstring: 5/5 Hip flexor: 5/5 Hip abductors: 4/5 but symmetric Gait unremarkable.  [id number]; 15 additional minutes spent for Therapeutic exercises as stated in above notes.  This included exercises focusing on stretching, strengthening, with significant focus on eccentric aspects.   Long term goals include an improvement in range of motion, strength, endurance as well as avoiding reinjury. Patient's frequency would include in 1-2 times a day, 3-5 times a week for a duration of 6-12 weeks. Low back exercises that included:  Pelvic tilt/bracing instruction to focus on control of the pelvic girdle and lower abdominal muscles  Glute  strengthening exercises, focusing on proper firing of the glutes without engaging the low back muscles Proper stretching techniques for maximum relief for the hamstrings, hip flexors, low back and some rotation where tolerated   Proper technique shown and discussed handout in great detail with ATC.  All questions were discussed and answered.      Impression and Recommendations:     This case required medical decision making of moderate complexity.      Note: This dictation was prepared with Dragon dictation along with smaller phrase technology. Any transcriptional errors that result from this process are unintentional.

## 2017-11-25 ENCOUNTER — Encounter: Payer: Self-pay | Admitting: Family Medicine

## 2017-11-25 ENCOUNTER — Ambulatory Visit (INDEPENDENT_AMBULATORY_CARE_PROVIDER_SITE_OTHER): Payer: BLUE CROSS/BLUE SHIELD | Admitting: Family Medicine

## 2017-11-25 VITALS — BP 112/74 | HR 89 | Ht 65.0 in | Wt 142.0 lb

## 2017-11-25 DIAGNOSIS — M5416 Radiculopathy, lumbar region: Secondary | ICD-10-CM | POA: Diagnosis not present

## 2017-11-25 DIAGNOSIS — M5431 Sciatica, right side: Secondary | ICD-10-CM

## 2017-11-25 MED ORDER — GABAPENTIN 100 MG PO CAPS: 200.0000 mg | ORAL_CAPSULE | Freq: Every day | ORAL | 3 refills | Status: DC

## 2017-11-25 NOTE — Patient Instructions (Signed)
Very nice to meet you  Sara Nelson is your friend. Ice 20 minutes 2 times daily. Usually after activity and before bed. pennsaid pinkie amount topically 2 times daily as needed.  Gabapentin 200mg  at night Continue the once weekly vitamin D  Pt will be calling you  Turmeric 500mg  daily  Tart cherry extract any dose at night See me again in 3 weeks and if not a lot better we will consider MRI

## 2017-11-25 NOTE — Assessment & Plan Note (Signed)
Patient is having sciatica on the right side.  Concern for a lumbar radiculopathy.  No underlying autoimmune disease is noted though.  We discussed the possibility of another round of prednisone which patient declined.  We will do physical therapy, started gabapentin.  Home exercises given.  Discussed icing regimen.  Patient will try some over-the-counter medications.  Worsening symptoms advanced imaging including MRI would be necessary.  Follow-up again in 3 weeks

## 2017-12-03 ENCOUNTER — Other Ambulatory Visit: Payer: BLUE CROSS/BLUE SHIELD | Admitting: Obstetrics and Gynecology

## 2017-12-03 ENCOUNTER — Other Ambulatory Visit: Payer: BLUE CROSS/BLUE SHIELD

## 2017-12-16 NOTE — Progress Notes (Signed)
[facility] [place], [place] [address] Phone: [phone] Subjective:     CC: back pain follow-up  TMH:[id number]  Sara Nelson is a 42 y.o. female coming in with complaint of back pain.  Patient has had some for quite some time.  Was having more of a lumbar radiculopathy.  Patient states unfortunately with the conservative therapy has seemed to be getting worse.  Patient states that it is severe overall.  Rates the severity of pain 9 out of 10.  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  States that she is noticing weakness of the right leg.    Patient did have x-rays that were independently visualized by me showing the degenerative disc disease at L4-L5  Past Medical History:  Diagnosis Date  . Endometriosis   . Hyperlipidemia   . Sarcoid    Past Surgical History:  Procedure Laterality Date  . CESAREAN SECTION    . LAPAROTOMY  2003   missing one tube and ovary? Endometriosis  . NECK SURGERY  01-24-12   removed lymph node   Social History   Socioeconomic History  . Marital status: Single    Spouse name: Not on file  . Number of children: 2  . Years of education: Not on file  . Highest education level: Not on file  Occupational History  . Occupation: Art therapist  Social Needs  . Financial resource strain: Not on file  . Food insecurity:    Worry: Not on file    Inability: Not on file  . Transportation needs:    Medical: Not on file    Non-medical: Not on file  Tobacco Use  . Smoking status: Never Smoker  . Smokeless tobacco: Never Used  . Tobacco comment: patient smokes THC every night   Substance and Sexual Activity  . Alcohol use: Yes    Alcohol/week: 0.6 oz    Types: 1 Standard drinks or equivalent per week  . Drug use: Yes    Types: Marijuana    Comment:  brews the marijuana in her kerig and drinks   . Sexual activity: Yes    Partners: Male    Birth control/protection: Condom  Lifestyle  . Physical activity:    Days per week: Not on file    Minutes per session: Not on file  . Stress: Not on file  Relationships  . Social connections:    Talks on phone: Not on file    Gets together: Not on file    Attends religious service: Not on file    Active member of club or organization: Not on file    Attends meetings of clubs or organizations: Not on file    Relationship status: Not on file  Other Topics Concern  . Not on file  Social History Narrative  . Not on file   Allergies  Allergen Reactions  . Ceftin [Cefuroxime Axetil] Shortness Of Breath  . Latex    Family History  Problem Relation Age of Onset  . Asthma Father        childhood-smoker  . Asthma Sister   . Asthma Brother   . Diabetes Paternal Grandmother   . Breast cancer Maternal Aunt   . Diabetes Maternal Grandmother   . Hypertension Maternal Grandmother   . Endometriosis Cousin      Past  medical history, social, surgical and family history all reviewed in electronic medical record.  No pertanent information unless stated regarding to the chief complaint.   Review of Systems:Review of systems updated and as accurate as of 12/17/17  No headache, visual changes, nausea, vomiting, diarrhea, constipation, dizziness, abdominal pain, skin rash, fevers, chills, night sweats, weight loss, swollen lymph nodes, body aches, joint swelling, chest pain, shortness of breath, mood changes.  Positive muscle aches  Objective  Blood pressure 110/74, pulse 74, height 5\' 5"  (1.651 m), weight 141 lb (64 kg), SpO2 98 %. Systems examined below as of 12/17/17   General: No apparent distress alert and oriented x3 mood and affect normal, dressed appropriately.  HEENT: Pupils equal, extraocular movements intact  Respiratory: Patient's speak in full sentences and does not appear short of  breath  Cardiovascular: No lower extremity edema, non tender, no erythema  Skin: Warm dry intact with no signs of infection or rash on extremities or on axial skeleton.  Abdomen: Soft nontender  Neuro: Cranial nerves II through XII are intact, neurovascularly intact in all extremities and 2+ pulses.  Lymph: No lymphadenopathy of posterior or anterior cervical chain or axillae bilaterally.  Gait mild antalgic MSK:  Non tender with full range of motion and good stability and symmetric strength and tone of shoulders, elbows, wrist, hip, knee and ankles bilaterally.  Back Exam:  Inspection: Mild loss of lordosis Motion: Flexion 25 deg with worsening symptoms, Extension 15 deg, Side Bending to 25 deg bilaterally,  Rotation to 30 deg bilaterally  SLR laying: Positive at 20 degrees XSLR laying: Negative  Palpable tenderness: Tender to palpation the paraspinal musculature lumbar spine right greater than left. FABER: Tightness on the right side. Sensory change: Gross sensation intact to all lumbar and sacral dermatomes.  Reflexes: 2+ at both patellar tendons, 1+ at achilles tendon on the right compared to the contralateral side, Babinski's downgoing.  Strength at foot  Patient does have weakness with 4 out of 5 strength of dorsiflexion and plantarflexion of the right side.    Impression and Recommendations:     This case required medical decision making of moderate complexity.      Note: This dictation was prepared with Dragon dictation along with smaller phrase technology. Any transcriptional errors that result from this process are unintentional.

## 2017-12-17 ENCOUNTER — Encounter: Payer: Self-pay | Admitting: Family Medicine

## 2017-12-17 ENCOUNTER — Ambulatory Visit: Payer: BLUE CROSS/BLUE SHIELD | Admitting: Family Medicine

## 2017-12-17 VITALS — BP 110/74 | HR 74 | Ht 65.0 in | Wt 141.0 lb

## 2017-12-17 DIAGNOSIS — G8929 Other chronic pain: Secondary | ICD-10-CM | POA: Diagnosis not present

## 2017-12-17 DIAGNOSIS — M549 Dorsalgia, unspecified: Secondary | ICD-10-CM | POA: Diagnosis not present

## 2017-12-17 NOTE — Assessment & Plan Note (Signed)
Worsening symptoms with worsening radicular symptoms.  Positive straight leg test with weakness.  1+ deep tendon reflexes.  Concern for worsening herniated disc.  Patient could be a candidate for epidurals.  I do feel that advanced imaging is warranted at this time.  MRI ordered.  Depending on findings we will discuss further.  Patient will be put on restrictions at work.  Encourage gabapentin to increase and see if this will be beneficial in the interim.

## 2017-12-17 NOTE — Patient Instructions (Signed)
Good to see you  I am sorry you are hurting We will put you on restrictions for now.  We will get MRI and see what is going on.  After MRI we will call and give options for next step  Gabapentin 1-3 pills nightly can help  Lets hold on follow up for now.

## 2018-01-06 ENCOUNTER — Other Ambulatory Visit: Payer: Self-pay

## 2018-01-06 ENCOUNTER — Ambulatory Visit: Payer: BLUE CROSS/BLUE SHIELD

## 2018-01-06 ENCOUNTER — Ambulatory Visit (INDEPENDENT_AMBULATORY_CARE_PROVIDER_SITE_OTHER): Payer: BLUE CROSS/BLUE SHIELD | Admitting: Obstetrics and Gynecology

## 2018-01-06 ENCOUNTER — Encounter: Payer: Self-pay | Admitting: Obstetrics and Gynecology

## 2018-01-06 VITALS — BP 110/70 | HR 84 | Resp 14 | Wt 141.0 lb

## 2018-01-06 DIAGNOSIS — F4321 Adjustment disorder with depressed mood: Secondary | ICD-10-CM

## 2018-01-06 DIAGNOSIS — Z113 Encounter for screening for infections with a predominantly sexual mode of transmission: Secondary | ICD-10-CM

## 2018-01-06 DIAGNOSIS — F418 Other specified anxiety disorders: Secondary | ICD-10-CM

## 2018-01-06 MED ORDER — CITALOPRAM HYDROBROMIDE 20 MG PO TABS: ORAL_TABLET | ORAL | 1 refills | Status: DC

## 2018-01-06 MED ORDER — ESCITALOPRAM OXALATE 10 MG PO TABS: ORAL_TABLET | ORAL | 1 refills | Status: DC

## 2018-01-06 NOTE — Progress Notes (Signed)
GYNECOLOGY  VISIT   HPI: 42 y.o.   Single  African American  female   [phone] with Patient's last menstrual period was 12/26/2017.   here for STD testing. She found out that her live in partner of 13 years (recently married) cheated on her. They are going to get a divorce. She is feeling depressed, some anxiety. No thoughts of hurting herself or others. She has good support. She has noticed a slight increase in vaginal d/c in the last week, no other c/o. Wants full STD testing.    GYNECOLOGIC HISTORY: Patient's last menstrual period was 12/26/2017. Contraception:none Menopausal hormone therapy: none         OB History    Gravida  3   Para  2   Term  2   Preterm      AB  1   Living  2     SAB      TAB      Ectopic      Multiple      Live Births  2              Patient Active Problem List   Diagnosis Date Noted  . Sciatica of right side 10/07/2017  . OCD (obsessive compulsive disorder) 09/23/2015  . Yeast vaginitis 03/21/2014  . Seasonal and perennial allergic rhinitis 03/21/2014  . Endometriosis of pelvic peritoneum 11/21/2012  . Acute upper respiratory infections of unspecified site 11/04/2012  . Coryza 11/04/2012  . Allergic urticaria 05/27/2012  . Insomnia 05/27/2012  . Sarcoid ([place]) 02/01/2012    Past Medical History:  Diagnosis Date  . Endometriosis   . Hyperlipidemia   . Sarcoid     Past Surgical History:  Procedure Laterality Date  . CESAREAN SECTION    . LAPAROTOMY  2003   missing one tube and ovary? Endometriosis  . NECK SURGERY  01-24-12   removed lymph node    Current Outpatient Medications  Medication Sig Dispense Refill  . gabapentin (NEURONTIN) 100 MG capsule Take 2 capsules (200 mg total) by mouth at bedtime. 60 capsule 3  . traZODone (DESYREL) 50 MG tablet 1 or 2 tabs at bedtime if needed for sleep 30 tablet 5  . Vitamin D, Ergocalciferol, (DRISDOL) 50000 units CAPS capsule Take 1 capsule (50,000 Units total) by mouth every 7  (seven) days. 12 capsule 0   No current facility-administered medications for this visit.      ALLERGIES: Ceftin [cefuroxime axetil] and Latex  Family History  Problem Relation Age of Onset  . Asthma Father        childhood-smoker  . Asthma Sister   . Asthma Brother   . Diabetes Paternal Grandmother   . Breast cancer Maternal Aunt   . Diabetes Maternal Grandmother   . Hypertension Maternal Grandmother   . Endometriosis Cousin     Social History   Socioeconomic History  . Marital status: Single    Spouse name: Not on file  . Number of children: 2  . Years of education: Not on file  . Highest education level: Not on file  Occupational History  . Occupation: Art therapist  Social Needs  . Financial resource strain: Not on file  . Food insecurity:    Worry: Not on file    Inability: Not on file  . Transportation needs:    Medical: Not on file    Non-medical: Not on file  Tobacco Use  . Smoking status: Never Smoker  . Smokeless tobacco: Never Used  .  Tobacco comment: patient smokes THC every night   Substance and Sexual Activity  . Alcohol use: Yes    Alcohol/week: 0.6 oz    Types: 1 Standard drinks or equivalent per week  . Drug use: Yes    Types: Marijuana    Comment: brews the marijuana in her kerig and drinks   . Sexual activity: Yes    Partners: Male    Birth control/protection: Condom  Lifestyle  . Physical activity:    Days per week: Not on file    Minutes per session: Not on file  . Stress: Not on file  Relationships  . Social connections:    Talks on phone: Not on file    Gets together: Not on file    Attends religious service: Not on file    Active member of club or organization: Not on file    Attends meetings of clubs or organizations: Not on file    Relationship status: Not on file  . Intimate partner violence:    Fear of current or ex partner: Not on file    Emotionally abused: Not on file    Physically abused: Not on file    Forced  sexual activity: Not on file  Other Topics Concern  . Not on file  Social History Narrative  . Not on file    Review of Systems  Constitutional: Negative.   HENT: Negative.   Eyes: Negative.   Respiratory: Negative.   Cardiovascular: Negative.   Gastrointestinal: Negative.   Genitourinary:       Vaginal discharge   Musculoskeletal: Negative.   Skin: Negative.   Neurological: Negative.   Endo/Heme/Allergies: Negative.   Psychiatric/Behavioral: Negative.     PHYSICAL EXAMINATION:    BP 110/70 (BP Location: Right Arm, Patient Position: Sitting, Cuff Size: Normal)   Pulse 84   Resp 14   Wt 141 lb (64 kg)   LMP 12/26/2017   BMI 23.46 kg/m     General appearance: alert, cooperative and appears stated age  Pelvic: External genitalia:  no lesions              Urethra:  normal appearing urethra with no masses, tenderness or lesions              Bartholins and Skenes: normal                 Vagina: normal appearing vagina with normal color and discharge, no lesions              Cervix: no cervical motion tenderness and no lesions              Bimanual Exam:  Uterus:  retroverted, slightly enlarged, mildly tender.              Adnexa: no mass, fullness, tenderness                Chaperone was present for exam.  ASSESSMENT Screening STD, Husband cheated Situational depression/anxiety    PLAN Full STD testing Start Celexa (she will stop trazadone, states she doesn't use it anyway) F/U in one month Declines counseling   An After Visit Summary was printed and given to the patient.  ~20 minutes face to face time of which over 50% was spent in counseling.

## 2018-01-06 NOTE — Patient Instructions (Signed)

## 2018-01-07 ENCOUNTER — Telehealth: Payer: Self-pay | Admitting: *Deleted

## 2018-01-07 LAB — HEP, RPR, HIV PANEL
HEP B S AG: NEGATIVE
HIV Screen 4th Generation wRfx: NONREACTIVE
RPR: NONREACTIVE

## 2018-01-07 LAB — VAGINITIS/VAGINOSIS, DNA PROBE
Candida Species: NEGATIVE
GARDNERELLA VAGINALIS: POSITIVE — AB
TRICHOMONAS VAG: NEGATIVE

## 2018-01-07 LAB — HSV(HERPES SIMPLEX VRS) I + II AB-IGG: HSV 2 IGG, TYPE SPEC: 16.4 {index} — AB (ref 0.00–0.90)

## 2018-01-07 LAB — GC/CHLAMYDIA PROBE AMP
CHLAMYDIA, DNA PROBE: NEGATIVE
Neisseria gonorrhoeae by PCR: NEGATIVE

## 2018-01-07 LAB — HEPATITIS C ANTIBODY

## 2018-01-07 NOTE — Telephone Encounter (Signed)
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 She was having screening, no symptoms. Let her know that you can have the virus and never have an outbreak, that 1 in 5 people have the virus. I can not tell from the testing how long she has had the virus. There are medications to treat outbreaks and for suppression if needed. If she has lots of questions, or wants to come in to discuss, please make her an appointment.  The rest of her blood work is negative for infection. Her cervical and vaginal cultures are pending.

## 2018-01-07 NOTE — Telephone Encounter (Signed)
Please see if she can come in today to discuss

## 2018-01-07 NOTE — Telephone Encounter (Signed)
Left message to call Kaitlyn at [phone]. 

## 2018-01-07 NOTE — Telephone Encounter (Signed)
Message left to return call to Triage Nurse at [phone]. Please route to Triage if I am available.

## 2018-01-07 NOTE — Telephone Encounter (Signed)
Spoke with patient. Results given. Patient verbalizes understanding of results. Patient is very concerned and overwhelmed. Patient requesting an appointment with Dr.Jertson on Friday. Advised Dr.Jertson is out of the office on Friday. Patient does not wish to see anyone else. Patient is unable to be seen for 2 weeks. Requesting a call from [place] to discuss. Does not feel she can wait to be seen. Advised will send a message to Dr.Jertson.

## 2018-01-10 ENCOUNTER — Telehealth: Payer: Self-pay

## 2018-01-10 MED ORDER — METRONIDAZOLE 500 MG PO TABS: 500.0000 mg | ORAL_TABLET | Freq: Two times a day (BID) | ORAL | 0 refills | Status: DC

## 2018-01-10 NOTE — Telephone Encounter (Signed)
Spoke with patient. Patient declines to return to office to discuss results with Dr.Jertson. Does not feel she needs to at this time. Will call to schedule if she changes her mind.  Routing to provider for final review. Patient agreeable to disposition. Will close encounter.

## 2018-01-10 NOTE — Telephone Encounter (Signed)
Spoke with patient. Results given. Patient states she is having increased vaginal discharge. Rx for Flagyl 500 mg po BID x 7 days #14 0RF sent to pharmacy on file. Avoid alcohol during treatment and 24 hours after completing medication. Don't mix with alcohol if mixed can cause severe nausea, vomiting and abdominal cramping. Patient verbalizes understanding.  Encounter closed.

## 2018-01-10 NOTE — Telephone Encounter (Signed)
-----   Message from Salvadore Dom, MD sent at 01/07/2018  4:07 PM EDT ----- Please inform the patient that her vaginitis probe was + for BV and check if she is symptomatic (she didn't have c/o yesterday). If she has symptoms of an increased d/c or odor, then treat with flagyl (either oral or vaginal, her choice), no ETOH while on Flagyl.  Oral: Flagyl 500 mg BID x 7 days, or Vaginal: Metrogel, 1 applicator per vagina q day x 5 days. Other vaginal and cervical testing was negative.

## 2018-01-13 ENCOUNTER — Encounter: Payer: Self-pay | Admitting: Family Medicine

## 2018-01-14 ENCOUNTER — Other Ambulatory Visit: Payer: BLUE CROSS/BLUE SHIELD

## 2018-01-14 ENCOUNTER — Other Ambulatory Visit: Payer: BLUE CROSS/BLUE SHIELD | Admitting: Obstetrics and Gynecology

## 2018-01-17 NOTE — Telephone Encounter (Addendum)
Called patient to check on her and see if she is okay. She wasn't able to talk, other people in the car and she was on speaker phone. I told her to call me back if she wanted to talk.  Will close encounter.

## 2018-01-31 ENCOUNTER — Ambulatory Visit
Admission: RE | Admit: 2018-01-31 | Discharge: 2018-01-31 | Disposition: A | Discharge status: Home / Self Care | Payer: BLUE CROSS/BLUE SHIELD | Source: Ambulatory Visit | Attending: Family Medicine | Admitting: Family Medicine

## 2018-01-31 DIAGNOSIS — M549 Dorsalgia, unspecified: Principal | ICD-10-CM

## 2018-01-31 DIAGNOSIS — G8929 Other chronic pain: Secondary | ICD-10-CM

## 2018-02-01 ENCOUNTER — Other Ambulatory Visit: Payer: Self-pay

## 2018-02-01 ENCOUNTER — Encounter (HOSPITAL_COMMUNITY): Payer: Self-pay | Admitting: Emergency Medicine

## 2018-02-01 ENCOUNTER — Ambulatory Visit (HOSPITAL_COMMUNITY)
Admission: EM | Admit: 2018-02-01 | Discharge: 2018-02-01 | Disposition: A | Discharge status: Home / Self Care | Payer: BLUE CROSS/BLUE SHIELD | Attending: Family Medicine | Admitting: Family Medicine

## 2018-02-01 DIAGNOSIS — H00011 Hordeolum externum right upper eyelid: Secondary | ICD-10-CM

## 2018-02-01 NOTE — ED Provider Notes (Signed)
Chief Complaint  Patient presents with  . Eye Problem    Subjective: Patient is a 42 y.o. female here for R eyelid pain and swelling.  Going on for 4 d. Slightly better today, no vision issues or redness in eye. No drainage. Has been using artificial tears at home.  ROS: Eyes: No vision changes  Past Medical History:  Diagnosis Date  . Endometriosis   . Hyperlipidemia   . Sarcoid     Objective: BP 115/61 (BP Location: Left Arm)   Pulse 77   Temp 98.1 F (36.7 C) (Oral)   Resp 18   LMP 02/01/2018   SpO2 100%  General: Awake, appears stated age HEENT: EOMI, PERRLA, sclera white, lids nml on L, edematous and mildly erythematous on R upper lid, no TTP Lungs: No accessory muscle use Psych: Age appropriate judgment and insight, normal affect and mood  Assessment and Plan: Hordeolum of right upper eyelid, unspecified hordeolum type  Letter written at pt's request to explain this is not contagious and does not require abx drops. Warm compresses, artificial tears prn. F/u with pcp prn. The patient voiced understanding and agreement to the plan.       Shelda Pal, [place] 02/01/18 1338

## 2018-02-01 NOTE — ED Triage Notes (Signed)
The patient presented to the [facility] with a complaint of right eye swelling and pain x 4 days.

## 2018-02-01 NOTE — Discharge Instructions (Signed)
Take some warm compresses and massage the area towards the eye lashes.   Baby shampoo over the eye lashes can be helpful.   This condition does not require antibiotics and is not contagious.  If there are changes, follow up here or with PCP.

## 2018-02-03 ENCOUNTER — Other Ambulatory Visit: Payer: Self-pay

## 2018-02-03 DIAGNOSIS — M5416 Radiculopathy, lumbar region: Secondary | ICD-10-CM

## 2018-02-05 ENCOUNTER — Other Ambulatory Visit: Payer: Self-pay

## 2018-02-05 DIAGNOSIS — G8929 Other chronic pain: Secondary | ICD-10-CM

## 2018-02-05 DIAGNOSIS — M5440 Lumbago with sciatica, unspecified side: Principal | ICD-10-CM

## 2018-02-05 NOTE — Progress Notes (Signed)
Done

## 2018-02-10 ENCOUNTER — Other Ambulatory Visit: Payer: Self-pay

## 2018-02-10 ENCOUNTER — Telehealth: Payer: Self-pay | Admitting: Obstetrics and Gynecology

## 2018-02-10 ENCOUNTER — Encounter: Payer: Self-pay | Admitting: Obstetrics and Gynecology

## 2018-02-10 ENCOUNTER — Ambulatory Visit (INDEPENDENT_AMBULATORY_CARE_PROVIDER_SITE_OTHER): Payer: BLUE CROSS/BLUE SHIELD | Admitting: Obstetrics and Gynecology

## 2018-02-10 VITALS — BP 115/72 | HR 60 | Wt 146.6 lb

## 2018-02-10 DIAGNOSIS — Z113 Encounter for screening for infections with a predominantly sexual mode of transmission: Secondary | ICD-10-CM

## 2018-02-10 DIAGNOSIS — F418 Other specified anxiety disorders: Secondary | ICD-10-CM

## 2018-02-10 DIAGNOSIS — N946 Dysmenorrhea, unspecified: Secondary | ICD-10-CM

## 2018-02-10 DIAGNOSIS — N920 Excessive and frequent menstruation with regular cycle: Secondary | ICD-10-CM

## 2018-02-10 DIAGNOSIS — N898 Other specified noninflammatory disorders of vagina: Secondary | ICD-10-CM

## 2018-02-10 LAB — POCT URINE PREGNANCY: Preg Test, Ur: NEGATIVE

## 2018-02-10 MED ORDER — CITALOPRAM HYDROBROMIDE 20 MG PO TABS: ORAL_TABLET | ORAL | 1 refills | Status: DC

## 2018-02-10 NOTE — Progress Notes (Addendum)
GYNECOLOGY  VISIT   HPI: 42 y.o.   Single  African American  female   [phone] with Patient's last menstrual period was 01/27/2018 (exact date).   here for 1 month follow up on Celexa. Patient reports she took the Celexa for a week and then stopped. Reports she is still having anxiety and depression, doesn't feel as depressed if she is busy. She is crying a lot. No thoughts of hurting herself or others.   Has concerns for pregnancy. Requesting UPT. Reports vaginal discharge and pain with intercourse. The patient's husband cheated on her (his girlfriend just had twins, one died). She was diagnosed with +HSV serology last month. She had sex with her husband without protection, then he left again.    She is working as a Art therapist, traveling for work.   GYNECOLOGIC HISTORY: Patient's last menstrual period was 01/27/2018 (exact date). Contraception:None Menopausal hormone therapy: None        OB History    Gravida  3   Para  2   Term  2   Preterm      AB  1   Living  2     SAB      TAB      Ectopic      Multiple      Live Births  2              Patient Active Problem List   Diagnosis Date Noted  . Sciatica of right side 10/07/2017  . OCD (obsessive compulsive disorder) 09/23/2015  . Yeast vaginitis 03/21/2014  . Seasonal and perennial allergic rhinitis 03/21/2014  . Endometriosis of pelvic peritoneum 11/21/2012  . Acute upper respiratory infections of unspecified site 11/04/2012  . Coryza 11/04/2012  . Allergic urticaria 05/27/2012  . Insomnia 05/27/2012  . Sarcoid ([place]) 02/01/2012    Past Medical History:  Diagnosis Date  . Endometriosis   . Hyperlipidemia   . Sarcoid     Past Surgical History:  Procedure Laterality Date  . CESAREAN SECTION    . LAPAROTOMY  2003   missing one tube and ovary? Endometriosis  . NECK SURGERY  01-24-12   removed lymph node    Current Outpatient Medications  Medication Sig Dispense Refill  . Vitamin D,  Ergocalciferol, (DRISDOL) 50000 units CAPS capsule Take 1 capsule (50,000 Units total) by mouth every 7 (seven) days. 12 capsule 0   No current facility-administered medications for this visit.      ALLERGIES: Ceftin [cefuroxime axetil] and Latex  Family History  Problem Relation Age of Onset  . Asthma Father        childhood-smoker  . Asthma Sister   . Asthma Brother   . Diabetes Paternal Grandmother   . Breast cancer Maternal Aunt   . Diabetes Maternal Grandmother   . Hypertension Maternal Grandmother   . Endometriosis Cousin     Social History   Socioeconomic History  . Marital status: Single    Spouse name: Not on file  . Number of children: 2  . Years of education: Not on file  . Highest education level: Not on file  Occupational History  . Occupation: Art therapist  Social Needs  . Financial resource strain: Not on file  . Food insecurity:    Worry: Not on file    Inability: Not on file  . Transportation needs:    Medical: Not on file    Non-medical: Not on file  Tobacco Use  . Smoking status: Never Smoker  .  Smokeless tobacco: Never Used  . Tobacco comment: patient smokes THC every night   Substance and Sexual Activity  . Alcohol use: Yes    Alcohol/week: 0.6 oz    Types: 1 Standard drinks or equivalent per week  . Drug use: Yes    Types: Marijuana    Comment: brews the marijuana in her kerig and drinks   . Sexual activity: Yes    Partners: Male    Birth control/protection: None  Lifestyle  . Physical activity:    Days per week: Not on file    Minutes per session: Not on file  . Stress: Not on file  Relationships  . Social connections:    Talks on phone: Not on file    Gets together: Not on file    Attends religious service: Not on file    Active member of club or organization: Not on file    Attends meetings of clubs or organizations: Not on file    Relationship status: Not on file  . Intimate partner violence:    Fear of current or ex  partner: Not on file    Emotionally abused: Not on file    Physically abused: Not on file    Forced sexual activity: Not on file  Other Topics Concern  . Not on file  Social History Narrative  . Not on file    Review of Systems  Constitutional: Negative.   HENT: Negative.   Eyes: Negative.   Respiratory: Negative.   Cardiovascular: Negative.   Gastrointestinal: Negative.   Genitourinary:       Vaginal discharge Pain with intercourse  Musculoskeletal: Negative.   Skin: Negative.   Neurological: Negative.   Endo/Heme/Allergies: Negative.   Psychiatric/Behavioral: Positive for depression.       Anxiety    PHYSICAL EXAMINATION:    BP 115/72 (BP Location: Right Arm, Patient Position: Sitting)   Pulse 60   Wt 146 lb 9.6 oz (66.5 kg)   LMP 01/27/2018 (Exact Date)   BMI 24.40 kg/m     General appearance: alert, cooperative and appears stated age  Pelvic: External genitalia:  no lesions              Urethra:  normal appearing urethra with no masses, tenderness or lesions              Bartholins and Skenes: normal                 Vagina: normal appearing vagina with a slight increase in watery/white discharge, no lesions              Cervix: no cervical motion tenderness and no lesions              Bimanual Exam:  Uterus:  retroverted, slightly enlarged, decreased mobility, mildly tender              Adnexa: no mass, fullness, tenderness               Chaperone was present for exam.  ASSESSMENT Depression/anxiety Vaginal d/c STD screening Menorrhagia and dysmenorrhea   PLAN Retry celexa Declines counseling [facility] Return next month for ultrasound and f/u celexa   An After Visit Summary was printed and given to the patient.  ~25 minutes face to face time of which over 50% was spent in counseling.

## 2018-02-10 NOTE — Telephone Encounter (Signed)
Please call the patient, she c/o menorrhagia and dysmenorrhea at her annual exam in 4/19. Was noted to have a slightly enlarged uterus on exam. Not anemic.  An ultrasound was ordered, never done. If she continues to have issues I would recommend the ultrasound. Please discuss this with her.

## 2018-02-10 NOTE — Telephone Encounter (Addendum)
Left message to call [place] at [phone].  Patient has an appointment scheduled for this afternoon with Dr.Jertson.

## 2018-02-10 NOTE — Progress Notes (Signed)
42 y.o. [id number] SingleAfrican AmericanF here for annual exam.  Cycles are every 30 days x 7-9 days (always). Passes huge clots the size of tennis balls. The heavy bleeding lasts for 2-3 days. Can saturate a maxi pad every 1.5 hours. Cramps are tolerable. She was treated with prednisone for her sarcoidosis, which helped her endometriosis. Doesn't use NSAID's.  She is drinking marijuana and it has helped a lot of her ailments (helps her sarcoidosis, discomfort and insomnia). Takes it at bedtime.  Period Cycle (Days): 30 Period Duration (Days): 7-9  Period Pattern: (!) Irregular Menstrual Flow: Heavy Menstrual Control: Maxi pad Menstrual Control Change Freq (Hours): every 2 hours Dysmenorrhea: (!) Moderate Dysmenorrhea Symptoms: Throbbing(back pain )  Sexually active, same partner on and off x 13 years. Living together. No dyspareunia.  She had a ?mirena IUD years ago, didn't like it. Had vomiting with OCP's when she was 60. With her sarcoidosis she has a cough, breathing funny in the am.   Patient's last menstrual period was 10/06/2016.          Sexually active: Yes.    The current method of family planning is condoms  Exercising: No.  The patient does not participate in regular exercise at present. Smoker:  no  Health Maintenance: Pap:  07/2016 [facility], 10/26/13 negative History of abnormal Pap:  Yes, h/o surgery on her cervix (?LEEP) MMG:  ?2016 with Dr. Glennon MacLaurance Flatten  Colonoscopy:  Never  BMD:   never TDaP:  Unsure     reports that she has never smoked. She has never used smokeless tobacco. She reports that she drinks about 0.6 oz of alcohol per week. She reports that she has current or past drug history. Drug: Marijuana.  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 4 kids, 50 year old daughter (in her second year at [employer], studying biology), 66 year old son (10th grade)  Past Medical  History:  Diagnosis Date  . Endometriosis   . Hyperlipidemia   . Sarcoid     Past Surgical History:  Procedure Laterality Date  . CESAREAN SECTION    . LAPAROTOMY  2003   missing one tube and ovary? Endometriosis  . NECK SURGERY  01-24-12   removed lymph node    Current Outpatient Medications  Medication Sig Dispense Refill  . cyclobenzaprine (FLEXERIL) 5 MG tablet   0   No current facility-administered medications for this visit.     Family History  Problem Relation Age of Onset  . Asthma Father        childhood-smoker  . Asthma Sister   . Asthma Brother   . Diabetes Paternal Grandmother   . Breast cancer Maternal Aunt   . Diabetes Maternal Grandmother   . Hypertension Maternal Grandmother   . Endometriosis Cousin     Review of Systems  Constitutional: Negative.   HENT: Negative.   Eyes: Negative.   Respiratory: Negative.   Cardiovascular: Negative.   Gastrointestinal: Negative.   Endocrine: Negative.   Genitourinary: Negative.   Musculoskeletal: Negative.   Skin: Negative.   Allergic/Immunologic: Negative.   Neurological: Negative.   Hematological: Negative.   Psychiatric/Behavioral: Negative.     Exam:   BP 102/60 (BP Location: Right Arm, Patient Position: Sitting, Cuff Size: Normal)   Pulse 88   Resp 14   Ht 5' 4.5" (1.638 m)   Wt 148 lb (67.1 kg)   LMP 10/06/2016  BMI 25.01 kg/m   Weight change: @WEIGHTCHANGE @ Height:   Height: 5' 4.5" (163.8 cm)  Ht Readings from Last 3 Encounters:  10/28/17 5' 4.5" (1.638 m)  08/19/17 5\' 5"  (1.651 m)  07/24/16 5\' 5"  (1.651 m)    General appearance: alert, cooperative and appears stated age Head: Normocephalic, without obvious abnormality, atraumatic Neck: no adenopathy, supple, symmetrical, trachea midline and thyroid normal to inspection and palpation Lungs: clear to auscultation bilaterally Cardiovascular: regular rate and rhythm Breasts: normal appearance, no masses or tenderness Abdomen: soft,  non-tender; non distended,  no masses,  no organomegaly Extremities: extremities normal, atraumatic, no cyanosis or edema Skin: Skin color, texture, turgor normal. No rashes or lesions Lymph nodes: Cervical, supraclavicular, and axillary nodes normal. No abnormal inguinal nodes palpated Neurologic: Grossly normal   Pelvic: External genitalia:  no lesions              Urethra:  normal appearing urethra with no masses, tenderness or lesions              Bartholins and Skenes: normal                 Vagina: normal appearing vagina with normal color and discharge, no lesions              Cervix: no lesions and difficult to visualize, could only see posterior lip. Under the pubic syphsis                Bimanual Exam:  Uterus:  retroverted, slightly enlarged and irregular              Adnexa: no mass, fullness, tenderness               Rectovaginal: Confirms               Anus:  normal sphincter tone, no lesions  Chaperone was present for exam.  A:  Well Woman with normal exam  Vit d def  Screening STD  Menorrhagia  Enlarged uterus  P:   Pap with hpv  Discussed breast self exam  Discussed calcium and vit D intake  Tubal for contraception  Screening STD  Vit d  Lipids (other labs okay)   Return for GYN ultrasound  She hasn't tolerated the ?mirena IUD in the past  Consider OCP's, information given. Will further discuss after her evaluation is complete

## 2018-02-10 NOTE — Telephone Encounter (Signed)
Spoke with patient. Patient states that she is still having menorrhagia and dysmenorrhea. Patient is coming in today for a recheck appointment at 4 pm and would like to discuss PUS with Dr.Jertson at that time.  Routing to provider for final review. Patient agreeable to disposition. Will close encounter.

## 2018-02-11 ENCOUNTER — Telehealth: Payer: Self-pay | Admitting: *Deleted

## 2018-02-11 LAB — VAGINITIS/VAGINOSIS, DNA PROBE
Candida Species: NEGATIVE
Gardnerella vaginalis: POSITIVE — AB
Trichomonas vaginosis: NEGATIVE

## 2018-02-11 LAB — GC/CHLAMYDIA PROBE AMP
Chlamydia trachomatis, NAA: NEGATIVE
Neisseria gonorrhoeae by PCR: NEGATIVE

## 2018-02-11 NOTE — Telephone Encounter (Signed)
Notes recorded by Burnice Logan, RN on 02/11/2018 at 3:34 PM EDT Left message to call Sharee Pimple at [phone].

## 2018-02-11 NOTE — Telephone Encounter (Signed)
-----   Message from Salvadore Dom, MD sent at 02/11/2018  1:53 PM EDT ----- Please inform the patient that her vaginitis probe was + for BV and treat with flagyl (either oral or vaginal, her choice), no ETOH while on Flagyl.  Oral: Flagyl 500 mg BID x 7 days, or Vaginal: Metrogel, 1 applicator per vagina q day x 5 days. The rest of her testing was negative.

## 2018-02-11 NOTE — Addendum Note (Signed)
Addended by: Dorothy Spark on: 02/11/2018 01:56 PM   Modules accepted: Orders

## 2018-02-11 NOTE — Progress Notes (Signed)
GYNECOLOGY  VISIT   HPI: 42 y.o.   Single  African American  female   [phone] with Patient's last menstrual period was 01/27/2018 (exact date).   here for 1 month follow up on Celexa. Patient reports she took the Celexa for a week and then stopped. Reports she is still having anxiety and depression, doesn't feel as depressed if she is busy. She is crying a lot. No thoughts of hurting herself or others.   Has concerns for pregnancy. Requesting UPT. Reports vaginal discharge and pain with intercourse. The patient's husband cheated on her (his girlfriend just had twins, one died). She was diagnosed with +HSV serology last month. She had sex with her husband without protection, then he left again.    She is working as a Art therapist, traveling for work.   GYNECOLOGIC HISTORY: Patient's last menstrual period was 01/27/2018 (exact date). Contraception:None Menopausal hormone therapy: None        OB History    Gravida  3   Para  2   Term  2   Preterm      AB  1   Living  2     SAB      TAB      Ectopic      Multiple      Live Births  2              Patient Active Problem List   Diagnosis Date Noted  . Sciatica of right side 10/07/2017  . OCD (obsessive compulsive disorder) 09/23/2015  . Yeast vaginitis 03/21/2014  . Seasonal and perennial allergic rhinitis 03/21/2014  . Endometriosis of pelvic peritoneum 11/21/2012  . Acute upper respiratory infections of unspecified site 11/04/2012  . Coryza 11/04/2012  . Allergic urticaria 05/27/2012  . Insomnia 05/27/2012  . Sarcoid ([place]) 02/01/2012    Past Medical History:  Diagnosis Date  . Endometriosis   . Hyperlipidemia   . Sarcoid     Past Surgical History:  Procedure Laterality Date  . CESAREAN SECTION    . LAPAROTOMY  2003   missing one tube and ovary? Endometriosis  . NECK SURGERY  01-24-12   removed lymph node    Current Outpatient Medications  Medication Sig Dispense Refill  . Vitamin D,  Ergocalciferol, (DRISDOL) 50000 units CAPS capsule Take 1 capsule (50,000 Units total) by mouth every 7 (seven) days. 12 capsule 0   No current facility-administered medications for this visit.      ALLERGIES: Ceftin [cefuroxime axetil] and Latex  Family History  Problem Relation Age of Onset  . Asthma Father        childhood-smoker  . Asthma Sister   . Asthma Brother   . Diabetes Paternal Grandmother   . Breast cancer Maternal Aunt   . Diabetes Maternal Grandmother   . Hypertension Maternal Grandmother   . Endometriosis Cousin     Social History   Socioeconomic History  . Marital status: Single    Spouse name: Not on file  . Number of children: 2  . Years of education: Not on file  . Highest education level: Not on file  Occupational History  . Occupation: Art therapist  Social Needs  . Financial resource strain: Not on file  . Food insecurity:    Worry: Not on file    Inability: Not on file  . Transportation needs:    Medical: Not on file    Non-medical: Not on file  Tobacco Use  . Smoking status: Never Smoker  .  Smokeless tobacco: Never Used  . Tobacco comment: patient smokes THC every night   Substance and Sexual Activity  . Alcohol use: Yes    Alcohol/week: 0.6 oz    Types: 1 Standard drinks or equivalent per week  . Drug use: Yes    Types: Marijuana    Comment: brews the marijuana in her kerig and drinks   . Sexual activity: Yes    Partners: Male    Birth control/protection: None  Lifestyle  . Physical activity:    Days per week: Not on file    Minutes per session: Not on file  . Stress: Not on file  Relationships  . Social connections:    Talks on phone: Not on file    Gets together: Not on file    Attends religious service: Not on file    Active member of club or organization: Not on file    Attends meetings of clubs or organizations: Not on file    Relationship status: Not on file  . Intimate partner violence:    Fear of current or ex  partner: Not on file    Emotionally abused: Not on file    Physically abused: Not on file    Forced sexual activity: Not on file  Other Topics Concern  . Not on file  Social History Narrative  . Not on file    Review of Systems  Constitutional: Negative.   HENT: Negative.   Eyes: Negative.   Respiratory: Negative.   Cardiovascular: Negative.   Gastrointestinal: Negative.   Genitourinary:       Vaginal discharge Pain with intercourse  Musculoskeletal: Negative.   Skin: Negative.   Neurological: Negative.   Endo/Heme/Allergies: Negative.   Psychiatric/Behavioral: Positive for depression.       Anxiety    PHYSICAL EXAMINATION:    BP 115/72 (BP Location: Right Arm, Patient Position: Sitting)   Pulse 60   Wt 146 lb 9.6 oz (66.5 kg)   LMP 01/27/2018 (Exact Date)   BMI 24.40 kg/m     General appearance: alert, cooperative and appears stated age  Pelvic: External genitalia:  no lesions              Urethra:  normal appearing urethra with no masses, tenderness or lesions              Bartholins and Skenes: normal                 Vagina: normal appearing vagina with a slight increase in watery/white discharge, no lesions              Cervix: no cervical motion tenderness and no lesions              Bimanual Exam:  Uterus:  retroverted, slightly enlarged, decreased mobility, mildly tender              Adnexa: no mass, fullness, tenderness               Chaperone was present for exam.  ASSESSMENT Depression/anxiety Vaginal d/c STD screening Menorrhagia and dysmenorrhea   PLAN Retry celexa Declines counseling [facility] Return next month for ultrasound and f/u celexa   An After Visit Summary was printed and given to the patient.  ~25 minutes face to face time of which over 50% was spent in counseling.

## 2018-02-14 MED ORDER — METRONIDAZOLE 500 MG PO TABS: 500.0000 mg | ORAL_TABLET | Freq: Two times a day (BID) | ORAL | 0 refills | Status: DC

## 2018-02-14 NOTE — Telephone Encounter (Signed)
Advised as seen below per Dr. Talbert Nan. Rx for Flagyl po to verified pharamcy. Patient request for future reference ALL results be left on voicemail in detailed message. States she travels on regular basis and often unable to get to phone. Patient verbalizes understanding and is agreeable. Encounter closed.

## 2018-02-21 ENCOUNTER — Ambulatory Visit
Admission: RE | Admit: 2018-02-21 | Discharge: 2018-02-21 | Disposition: A | Discharge status: Home / Self Care | Payer: Medicaid Other | Source: Ambulatory Visit | Attending: Family Medicine | Admitting: Family Medicine

## 2018-02-21 DIAGNOSIS — G8929 Other chronic pain: Secondary | ICD-10-CM

## 2018-02-21 DIAGNOSIS — M5440 Lumbago with sciatica, unspecified side: Principal | ICD-10-CM

## 2018-02-21 MED ORDER — IOPAMIDOL (ISOVUE-M 200) INJECTION 41%
1.0000 mL | Freq: Once | INTRAMUSCULAR | Status: AC
  Administered 2018-02-21: 1 mL via EPIDURAL

## 2018-02-21 MED ORDER — METHYLPREDNISOLONE ACETATE 40 MG/ML INJ SUSP (RADIOLOG
120.0000 mg | Freq: Once | INTRAMUSCULAR | Status: AC
  Administered 2018-02-21: 120 mg via EPIDURAL

## 2018-02-21 MED ORDER — DIAZEPAM 5 MG PO TABS: 5.0000 mg | ORAL_TABLET | Freq: Once | ORAL | Status: DC

## 2018-02-21 NOTE — Discharge Instructions (Signed)

## 2018-03-05 NOTE — Progress Notes (Signed)
GYNECOLOGY  VISIT   HPI: 42 y.o.   Single  African American  female   [phone] with Patient's last menstrual period was 02/27/2018 (exact date).   here for consult after PUS.   The patient has issues with menorrhagia and dysmenorrhea. At her annual exam in 4/19 her uterus was noted to be slightly enlarged. She is not anemic.  She has been dealing with depression. She is separated from her husband. Will not get back together. Still very stressed about the hsv diagnosis.  She is working a lot. Does okay at work, having trouble sleeping, overeating. No thoughts of hurting herself or others.  GYNECOLOGIC HISTORY: Patient's last menstrual period was 02/27/2018 (exact date). Contraception: None Menopausal hormone therapy: None        OB History    Gravida  3   Para  2   Term  2   Preterm      AB  1   Living  2     SAB      TAB      Ectopic      Multiple      Live Births  2              Patient Active Problem List   Diagnosis Date Noted  . Sciatica of right side 10/07/2017  . OCD (obsessive compulsive disorder) 09/23/2015  . Yeast vaginitis 03/21/2014  . Seasonal and perennial allergic rhinitis 03/21/2014  . Endometriosis of pelvic peritoneum 11/21/2012  . Acute upper respiratory infections of unspecified site 11/04/2012  . Coryza 11/04/2012  . Allergic urticaria 05/27/2012  . Insomnia 05/27/2012  . Sarcoid ([place]) 02/01/2012    Past Medical History:  Diagnosis Date  . Endometriosis   . Hyperlipidemia   . Sarcoid     Past Surgical History:  Procedure Laterality Date  . CESAREAN SECTION    . LAPAROTOMY  2003   missing one tube and ovary? Endometriosis  . NECK SURGERY  01-24-12   removed lymph node    Current Outpatient Medications  Medication Sig Dispense Refill  . citalopram (CELEXA) 20 MG tablet Take 1/2 a tablet a day for one week, if tolerating can increase to 1 tablet a day. 30 tablet 1  . Vitamin D, Ergocalciferol, (DRISDOL) 50000 units CAPS  capsule Take 1 capsule (50,000 Units total) by mouth every 7 (seven) days. 12 capsule 0   No current facility-administered medications for this visit.      ALLERGIES: Ceftin [cefuroxime axetil] and Latex  Family History  Problem Relation Age of Onset  . Asthma Father        childhood-smoker  . Asthma Sister   . Asthma Brother   . Diabetes Paternal Grandmother   . Breast cancer Maternal Aunt   . Diabetes Maternal Grandmother   . Hypertension Maternal Grandmother   . Endometriosis Cousin     Social History   Socioeconomic History  . Marital status: Single    Spouse name: Not on file  . Number of children: 2  . Years of education: Not on file  . Highest education level: Not on file  Occupational History  . Occupation: Art therapist  Social Needs  . Financial resource strain: Not on file  . Food insecurity:    Worry: Not on file    Inability: Not on file  . Transportation needs:    Medical: Not on file    Non-medical: Not on file  Tobacco Use  . Smoking status: Never Smoker  .  Smokeless tobacco: Never Used  . Tobacco comment: patient smokes THC every night   Substance and Sexual Activity  . Alcohol use: Yes    Alcohol/week: 1.0 standard drinks    Types: 1 Standard drinks or equivalent per week  . Drug use: Yes    Types: Marijuana    Comment: brews the marijuana in her kerig and drinks   . Sexual activity: Not Currently    Partners: Male    Birth control/protection: None  Lifestyle  . Physical activity:    Days per week: Not on file    Minutes per session: Not on file  . Stress: Not on file  Relationships  . Social connections:    Talks on phone: Not on file    Gets together: Not on file    Attends religious service: Not on file    Active member of club or organization: Not on file    Attends meetings of clubs or organizations: Not on file    Relationship status: Not on file  . Intimate partner violence:    Fear of current or ex partner: Not on file     Emotionally abused: Not on file    Physically abused: Not on file    Forced sexual activity: Not on file  Other Topics Concern  . Not on file  Social History Narrative  . Not on file    Review of Systems  Constitutional: Negative.   HENT: Negative.   Eyes: Negative.   Respiratory: Negative.   Cardiovascular: Negative.   Gastrointestinal: Negative.   Genitourinary: Negative.   Musculoskeletal: Negative.   Skin: Negative.   Neurological: Negative.   Endo/Heme/Allergies: Negative.   Psychiatric/Behavioral: Positive for depression.       Anxiety    PHYSICAL EXAMINATION:    BP 110/62 (BP Location: Right Arm, Patient Position: Sitting)   Pulse 72   Wt 151 lb (68.5 kg)   LMP 02/27/2018 (Exact Date)   BMI 25.13 kg/m     General appearance: alert, cooperative and appears stated age  Ultrasound images reviewed with the patient  ASSESSMENT Fibroid uterus, with menorrhagia and dysmenorrhea, declines OCP's, IUD and anaprox. Will deal with it. Not anemic Depression, not helped on Celexa, doesn't want to try other medication    PLAN Will wean off of the Celexa Names of counselors given   An After Visit Summary was printed and given to the patient.  ~15 minutes face to face time of which over 50% was spent in counseling.

## 2018-03-06 ENCOUNTER — Other Ambulatory Visit: Payer: Self-pay

## 2018-03-06 ENCOUNTER — Ambulatory Visit (INDEPENDENT_AMBULATORY_CARE_PROVIDER_SITE_OTHER): Payer: BLUE CROSS/BLUE SHIELD

## 2018-03-06 ENCOUNTER — Ambulatory Visit (INDEPENDENT_AMBULATORY_CARE_PROVIDER_SITE_OTHER): Payer: BLUE CROSS/BLUE SHIELD | Admitting: Obstetrics and Gynecology

## 2018-03-06 ENCOUNTER — Encounter: Payer: Self-pay | Admitting: Obstetrics and Gynecology

## 2018-03-06 VITALS — BP 110/62 | HR 72 | Wt 151.0 lb

## 2018-03-06 DIAGNOSIS — N920 Excessive and frequent menstruation with regular cycle: Secondary | ICD-10-CM

## 2018-03-06 DIAGNOSIS — D259 Leiomyoma of uterus, unspecified: Secondary | ICD-10-CM | POA: Diagnosis not present

## 2018-03-06 DIAGNOSIS — F329 Major depressive disorder, single episode, unspecified: Secondary | ICD-10-CM

## 2018-03-06 DIAGNOSIS — N946 Dysmenorrhea, unspecified: Secondary | ICD-10-CM

## 2018-03-06 DIAGNOSIS — F32A Depression, unspecified: Secondary | ICD-10-CM

## 2018-04-17 ENCOUNTER — Telehealth: Payer: Self-pay | Admitting: Family Medicine

## 2018-04-17 DIAGNOSIS — M5416 Radiculopathy, lumbar region: Secondary | ICD-10-CM

## 2018-04-17 DIAGNOSIS — M5431 Sciatica, right side: Secondary | ICD-10-CM

## 2018-04-17 NOTE — Telephone Encounter (Signed)
Spoke with pt, ordered epidural & sent to [facility]. Advised pt once injection is scheduled to call back & schedule an appt to f/u with Dr. Tamala Julian 2-3 weeks after injection.

## 2018-04-17 NOTE — Telephone Encounter (Signed)
Copied from [place] [phone]. Topic: Quick Communication - See Telephone Encounter >> Apr 17, 2018  8:52 AM Ivar Drape wrote: CRM for notification. See Telephone encounter for: 04/17/18. Patient had an epidural on 02/21/18 and it helped tremendously, but now she is experiencing the same pain.  She stated the doctor said she could have another one if it worked, so she would like to know how to go about getting another one.  Please call her cell phone , but if she doesn't answer please call her work number [phone].

## 2018-05-02 ENCOUNTER — Inpatient Hospital Stay
Admission: RE | Admit: 2018-05-02 | Discharge: 2018-05-02 | Disposition: A | Discharge status: Home / Self Care | Payer: Medicaid Other | Source: Ambulatory Visit | Attending: Family Medicine | Admitting: Family Medicine

## 2018-05-23 ENCOUNTER — Ambulatory Visit
Admission: RE | Admit: 2018-05-23 | Discharge: 2018-05-23 | Disposition: A | Discharge status: Home / Self Care | Payer: BLUE CROSS/BLUE SHIELD | Source: Ambulatory Visit | Attending: Family Medicine | Admitting: Family Medicine

## 2018-05-23 DIAGNOSIS — M5416 Radiculopathy, lumbar region: Secondary | ICD-10-CM

## 2018-05-23 MED ORDER — METHYLPREDNISOLONE ACETATE 40 MG/ML INJ SUSP (RADIOLOG
120.0000 mg | Freq: Once | INTRAMUSCULAR | Status: AC
  Administered 2018-05-23: 120 mg via EPIDURAL

## 2018-05-23 MED ORDER — DIAZEPAM 5 MG PO TABS
5.0000 mg | ORAL_TABLET | Freq: Once | ORAL | Status: AC
  Administered 2018-05-23: 5 mg via ORAL

## 2018-05-23 MED ORDER — IOPAMIDOL (ISOVUE-M 200) INJECTION 41%
1.0000 mL | Freq: Once | INTRAMUSCULAR | Status: AC
  Administered 2018-05-23: 1 mL via EPIDURAL

## 2018-05-23 NOTE — Discharge Instructions (Signed)

## 2018-09-19 ENCOUNTER — Telehealth: Payer: Self-pay | Admitting: Family Medicine

## 2018-09-19 DIAGNOSIS — M5431 Sciatica, right side: Secondary | ICD-10-CM

## 2018-09-19 NOTE — Telephone Encounter (Signed)
Copied from [place] [id number]. Topic: Quick Communication - See Telephone Encounter >> Sep 19, 2018  9:24 AM Margot Ables wrote: CRM for notification. See Telephone encounter for: 09/19/18 Pt asking for another order for DG INJECT DIAG/THERA/INC NEEDLE/CATH/PLC EPI/LUMB/SAC W/IMG for epidural - pt believes the las injection at [facility] was given on the wrong side and had bad experience with the provider yelling at her that was discussed with [facility] Admin . Pt will still go there but will see a different provider.

## 2018-09-22 NOTE — Telephone Encounter (Signed)
Can you confirm the correct injection please.

## 2018-09-22 NOTE — Telephone Encounter (Signed)
I would do L4/5 epidural

## 2018-09-23 NOTE — Telephone Encounter (Signed)
Order entered & sent to [facility].  

## 2018-10-17 ENCOUNTER — Other Ambulatory Visit: Payer: BLUE CROSS/BLUE SHIELD

## 2018-10-24 ENCOUNTER — Other Ambulatory Visit: Payer: BLUE CROSS/BLUE SHIELD

## 2018-10-30 ENCOUNTER — Encounter: Payer: Self-pay | Admitting: Internal Medicine

## 2018-11-10 ENCOUNTER — Ambulatory Visit: Payer: BLUE CROSS/BLUE SHIELD | Admitting: Internal Medicine

## 2018-11-24 ENCOUNTER — Ambulatory Visit: Payer: BLUE CROSS/BLUE SHIELD | Admitting: Obstetrics and Gynecology

## 2018-12-17 ENCOUNTER — Other Ambulatory Visit: Payer: Self-pay

## 2018-12-17 ENCOUNTER — Ambulatory Visit
Admission: RE | Admit: 2018-12-17 | Discharge: 2018-12-17 | Disposition: A | Discharge status: Home / Self Care | Payer: BLUE CROSS/BLUE SHIELD | Source: Ambulatory Visit | Attending: Family Medicine | Admitting: Family Medicine

## 2018-12-17 DIAGNOSIS — M5431 Sciatica, right side: Secondary | ICD-10-CM

## 2018-12-17 MED ORDER — IOPAMIDOL (ISOVUE-M 200) INJECTION 41%
1.0000 mL | Freq: Once | INTRAMUSCULAR | Status: AC
  Administered 2018-12-17: 1 mL via EPIDURAL

## 2018-12-17 MED ORDER — DIAZEPAM 5 MG PO TABS
5.0000 mg | ORAL_TABLET | Freq: Once | ORAL | Status: AC
  Administered 2018-12-17: 5 mg via ORAL

## 2018-12-17 MED ORDER — METHYLPREDNISOLONE ACETATE 40 MG/ML INJ SUSP (RADIOLOG
120.0000 mg | Freq: Once | INTRAMUSCULAR | Status: AC
  Administered 2018-12-17: 120 mg via EPIDURAL

## 2018-12-17 NOTE — Discharge Instructions (Signed)

## 2018-12-18 ENCOUNTER — Ambulatory Visit (INDEPENDENT_AMBULATORY_CARE_PROVIDER_SITE_OTHER): Payer: Medicaid Other | Admitting: Obstetrics and Gynecology

## 2018-12-18 ENCOUNTER — Encounter: Payer: Self-pay | Admitting: Obstetrics and Gynecology

## 2018-12-18 VITALS — BP 120/80 | HR 76 | Temp 98.2°F | Ht 65.0 in | Wt 159.0 lb

## 2018-12-18 DIAGNOSIS — D259 Leiomyoma of uterus, unspecified: Secondary | ICD-10-CM

## 2018-12-18 DIAGNOSIS — E559 Vitamin D deficiency, unspecified: Secondary | ICD-10-CM | POA: Insufficient documentation

## 2018-12-18 DIAGNOSIS — Z01419 Encounter for gynecological examination (general) (routine) without abnormal findings: Secondary | ICD-10-CM

## 2018-12-18 DIAGNOSIS — N939 Abnormal uterine and vaginal bleeding, unspecified: Secondary | ICD-10-CM

## 2018-12-18 DIAGNOSIS — Z113 Encounter for screening for infections with a predominantly sexual mode of transmission: Secondary | ICD-10-CM

## 2018-12-18 DIAGNOSIS — N946 Dysmenorrhea, unspecified: Secondary | ICD-10-CM

## 2018-12-18 DIAGNOSIS — Z0000 Encounter for general adult medical examination without abnormal findings: Secondary | ICD-10-CM | POA: Diagnosis not present

## 2018-12-18 NOTE — Progress Notes (Signed)
43 y.o. [id number] Divorced Black or African American Not Hispanic or Latino female here for annual exam. She has a known fibroid uterus with heavy flow and severe dysmenorrhea. Hasn't tolerated the IUD or OCP's in the past. Not anemic at last check.  Not sexually active.  H/O depression, didn't like Celexa. Her OCD is elevated, crying, not sleeping. She hasn't seen a counselor, not sure she would be comfortable talking with someone.  H/O HSV, never had an outbreak. Found on lab work after her husband cheated.   Cycles have been every 3-4 weeks x 9 days, last 2 days are spotting. They have always lasted this long. Marland Kitchen  Period Duration (Days): 9 days, coming twice a month Period Pattern: (!) Irregular Menstrual Flow: Moderate, Heavy Menstrual Control: Thin pad Menstrual Control Change Freq (Hours): changes pad every 2.5-3 hours Dysmenorrhea: (!) Severe Dysmenorrhea Symptoms: Cramping  Patient's last menstrual period was 12/17/2018 (exact date).          Sexually active: No.  The current method of family planning is none.    Exercising: Yes.    walking at work Smoker:  no  Health Maintenance: Pap:  10/28/2017 WNL NEG HPV, 07/2016 [facility], 10/26/13 negative History of abnormal Pap:  Yes, h/o surgery on her cervix MMG:  2016 with Dr. Glennon MacLaurance Flatten  Colonoscopy:  Never  BMD:   Never TDaP:  Unsure  Gardasil: 10/28/2017 completed 1, does not want to complete series   reports that she has never smoked. She has never used smokeless tobacco. She reports previous alcohol use. She reports previous drug use. 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 Daughter is 65, son is 69.   Past Medical History:  Diagnosis Date  . Endometriosis   . Hyperlipidemia   . Sarcoid     Past Surgical History:  Procedure Laterality Date  . CESAREAN SECTION    . LAPAROTOMY  2003   missing one tube and ovary? Endometriosis  . NECK SURGERY  01-24-12    removed lymph node    No current outpatient medications on file.   No current facility-administered medications for this visit.     Family History  Problem Relation Age of Onset  . Asthma Father        childhood-smoker  . Asthma Sister   . Asthma Brother   . Diabetes Paternal Grandmother   . Breast cancer Maternal Aunt   . Diabetes Maternal Grandmother   . Hypertension Maternal Grandmother   . Endometriosis Cousin     Review of Systems  Constitutional: Negative.   HENT: Negative.   Eyes: Negative.   Respiratory: Negative.   Cardiovascular: Negative.   Gastrointestinal: Negative.   Endocrine: Negative.   Genitourinary: Negative.   Musculoskeletal: Negative.   Skin: Negative.   Allergic/Immunologic: Negative.   Neurological: Negative.   Hematological: Negative.   Psychiatric/Behavioral: The patient is nervous/anxious.        Irritability    Exam:   BP 120/80 (BP Location: Left Arm, Patient Position: Sitting, Cuff Size: Normal)   Pulse 76   Temp 98.2 F (36.8 C) (Skin)   Ht 5\' 5"  (1.651 m)   Wt 159 lb (72.1 kg)   LMP 12/17/2018 (Exact Date)   BMI 26.46 kg/m   Weight change: @WEIGHTCHANGE @ Height:   Height: 5\' 5"  (165.1 cm)  Ht Readings from Last 3 Encounters:  12/18/18 5\' 5"  (1.651 m)  12/17/17 5\' 5"  (1.651 m)  11/25/17  5\' 5"  (1.651 m)    General appearance: alert, cooperative and appears stated age Head: Normocephalic, without obvious abnormality, atraumatic Neck: no adenopathy, supple, symmetrical, trachea midline and thyroid normal to inspection and palpation Lungs: clear to auscultation bilaterally Cardiovascular: regular rate and rhythm Breasts: normal appearance, no masses or tenderness Abdomen: soft, non-tender; non distended,  no masses,  no organomegaly Extremities: extremities normal, atraumatic, no cyanosis or edema Skin: Skin color, texture, turgor normal. No rashes or lesions Lymph nodes: Cervical, supraclavicular, and axillary nodes  normal. No abnormal inguinal nodes palpated Neurologic: Grossly normal   Pelvic: External genitalia:  no lesions              Urethra:  normal appearing urethra with no masses, tenderness or lesions              Bartholins and Skenes: normal                 Vagina: normal appearing vagina with normal color and discharge, no lesions              Cervix: no lesions               Bimanual Exam:  Uterus:  RV, mildly enlarged, irregular              Adnexa: no mass, fullness, tenderness               Rectovaginal: Confirms               Anus:  normal sphincter tone, no lesions  Chaperone was present for exam.  A:  Well Woman with normal exam  Fibroid uterus  Severe dysmenorrhea  AUB  Vit d def  Depression, OCD  P:   No pap this year  Mammogram, # given  Screening labs, TSH, Ferritin  Vit D  Desires STD testing.  Discussed breast self exam  Discussed calcium and vit D intake  Names of counselors and Psychiatrist given

## 2018-12-18 NOTE — Patient Instructions (Signed)

## 2018-12-19 LAB — HEP, RPR, HIV PANEL
HIV Screen 4th Generation wRfx: NONREACTIVE
Hepatitis B Surface Ag: NEGATIVE
RPR Ser Ql: NONREACTIVE

## 2018-12-19 LAB — LIPID PANEL
Chol/HDL Ratio: 3.3 ratio (ref 0.0–4.4)
Cholesterol, Total: 203 mg/dL — ABNORMAL HIGH (ref 100–199)
HDL: 62 mg/dL (ref >39)
LDL Calculated: 129 mg/dL — ABNORMAL HIGH (ref 0–99)
Triglycerides: 59 mg/dL (ref 0–149)
VLDL Cholesterol Cal: 12 mg/dL (ref 5–40)

## 2018-12-19 LAB — CBC
Hematocrit: 36.5 % (ref 34.0–46.6)
Hemoglobin: 11.3 g/dL (ref 11.1–15.9)
MCH: 25.6 pg — ABNORMAL LOW (ref 26.6–33.0)
MCHC: 31 g/dL — ABNORMAL LOW (ref 31.5–35.7)
MCV: 83 fL (ref 79–97)
Platelets: 509 10*3/uL — ABNORMAL HIGH (ref 150–450)
RBC: 4.42 x10E6/uL (ref 3.77–5.28)
RDW: 14.5 % (ref 11.7–15.4)
WBC: 10.5 10*3/uL (ref 3.4–10.8)

## 2018-12-19 LAB — COMPREHENSIVE METABOLIC PANEL
ALT: 7 IU/L (ref 0–32)
AST: 13 IU/L (ref 0–40)
Albumin/Globulin Ratio: 1.6 (ref 1.2–2.2)
Albumin: 4.3 g/dL (ref 3.8–4.8)
Alkaline Phosphatase: 64 IU/L (ref 39–117)
BUN/Creatinine Ratio: 14 (ref 9–23)
BUN: 10 mg/dL (ref 6–24)
Bilirubin Total: 0.5 mg/dL (ref 0.0–1.2)
CO2: 20 mmol/L (ref 20–29)
Calcium: 8.7 mg/dL (ref 8.7–10.2)
Chloride: 106 mmol/L (ref 96–106)
Creatinine, Ser: 0.71 mg/dL (ref 0.57–1.00)
GFR calc Af Amer: 122 mL/min/{1.73_m2} (ref >59)
GFR calc non Af Amer: 106 mL/min/{1.73_m2} (ref >59)
Globulin, Total: 2.7 g/dL (ref 1.5–4.5)
Glucose: 98 mg/dL (ref 65–99)
Potassium: 4.5 mmol/L (ref 3.5–5.2)
Sodium: 139 mmol/L (ref 134–144)
Total Protein: 7 g/dL (ref 6.0–8.5)

## 2018-12-19 LAB — FERRITIN: Ferritin: 12 ng/mL — ABNORMAL LOW (ref 15–150)

## 2018-12-19 LAB — TSH: TSH: 0.773 u[IU]/mL (ref 0.450–4.500)

## 2018-12-19 LAB — VITAMIN D 25 HYDROXY (VIT D DEFICIENCY, FRACTURES): Vit D, 25-Hydroxy: 17.4 ng/mL — ABNORMAL LOW (ref 30.0–100.0)

## 2018-12-19 LAB — HEPATITIS C ANTIBODY: Hep C Virus Ab: <0.1 s/co ratio (ref 0.0–0.9)

## 2018-12-20 LAB — GC/CHLAMYDIA PROBE AMP
Chlamydia trachomatis, NAA: NEGATIVE
Neisseria Gonorrhoeae by PCR: NEGATIVE

## 2018-12-23 ENCOUNTER — Telehealth: Payer: Self-pay

## 2018-12-23 NOTE — Telephone Encounter (Signed)
-----   Message from Sara Dom, MD sent at 12/22/2018  5:14 PM EDT ----- Please advise the patient of normal results. Please inform with her other results.

## 2018-12-23 NOTE — Telephone Encounter (Signed)
Left detailed message patient, okay per ROI. Advised of results below and the need to return call to schedule 3 month lab recheck appointment.

## 2018-12-23 NOTE — Telephone Encounter (Signed)
Patient left voicemail over lunch returning call to [facility]. Patient requested that a voicemail be left with her results.

## 2018-12-23 NOTE — Telephone Encounter (Signed)
Salvadore Dom, MD  Santiago Stenzel, Harley Hallmark, RN        Please let the patient know that she is not anemic, but she has low iron stores. She also has elevated platelets, which could be from the iron def. Please have her start one iron tablet a day (ie ferrex 150).  Her vit d level is low please have her start 2,000 IU of vit d 3 daily.  Please have her return in 3 months for a vit d, CBC and ferritin.  The rest of her blood work is fine. Cervical cultures are pending.    Left message to call [place] at [phone].

## 2018-12-23 NOTE — Telephone Encounter (Signed)
Patient returning call.  Ok to leave a detailed message.

## 2018-12-24 NOTE — Telephone Encounter (Signed)
Spoke with patient. Results given. Lab scheduled for 03/27/2019 at 9 am. Patient is agreeable to date and time.

## 2019-03-27 ENCOUNTER — Other Ambulatory Visit: Payer: Self-pay | Admitting: *Deleted

## 2019-03-27 ENCOUNTER — Encounter: Payer: Self-pay | Admitting: Obstetrics & Gynecology

## 2019-03-27 ENCOUNTER — Other Ambulatory Visit: Payer: Self-pay

## 2019-03-27 ENCOUNTER — Telehealth: Payer: Self-pay | Admitting: *Deleted

## 2019-03-27 ENCOUNTER — Ambulatory Visit: Payer: Medicaid Other | Admitting: Obstetrics & Gynecology

## 2019-03-27 ENCOUNTER — Other Ambulatory Visit (INDEPENDENT_AMBULATORY_CARE_PROVIDER_SITE_OTHER): Payer: Medicaid Other

## 2019-03-27 VITALS — BP 110/60 | HR 76 | Temp 97.7°F | Ht 65.0 in | Wt 159.4 lb

## 2019-03-27 DIAGNOSIS — R3915 Urgency of urination: Secondary | ICD-10-CM

## 2019-03-27 DIAGNOSIS — R899 Unspecified abnormal finding in specimens from other organs, systems and tissues: Secondary | ICD-10-CM

## 2019-03-27 DIAGNOSIS — D259 Leiomyoma of uterus, unspecified: Secondary | ICD-10-CM

## 2019-03-27 DIAGNOSIS — N852 Hypertrophy of uterus: Secondary | ICD-10-CM

## 2019-03-27 DIAGNOSIS — Z113 Encounter for screening for infections with a predominantly sexual mode of transmission: Secondary | ICD-10-CM | POA: Diagnosis not present

## 2019-03-27 DIAGNOSIS — E559 Vitamin D deficiency, unspecified: Secondary | ICD-10-CM

## 2019-03-27 LAB — POCT URINALYSIS DIPSTICK
Bilirubin, UA: NEGATIVE
Blood, UA: NEGATIVE
Glucose, UA: NEGATIVE
Ketones, UA: NEGATIVE
Leukocytes, UA: NEGATIVE
Nitrite, UA: NEGATIVE
Protein, UA: NEGATIVE
Urobilinogen, UA: 0.2 E.U./dL
pH, UA: 5 (ref 5.0–8.0)

## 2019-03-27 LAB — POCT URINE PREGNANCY: Preg Test, Ur: NEGATIVE

## 2019-03-27 MED ORDER — SULFAMETHOXAZOLE-TRIMETHOPRIM 800-160 MG PO TABS: 1.0000 | ORAL_TABLET | Freq: Two times a day (BID) | ORAL | 0 refills | Status: DC

## 2019-03-27 NOTE — Telephone Encounter (Signed)
Patient in office for f/u labs, requesting to check urine. Patient states she is newly married and increased sexual activity. Reports urgency, bloating and odorous urine for 1-2 wks. Started increasing fluids and cranberry juice, odor has resolved. Denies flank pain, fever/chills, dysuria, vaginal d/c or itching. Advised patient OV needed for further evaluation, patient is agreeable to scheduling with covering provider. Patient declines 11:30am appt, will return for 4pm appt with Dr. Sabra Heck.   Routing to provider for final review. Patient is agreeable to disposition. Will close encounter.  Cc: Dr. Talbert Nan

## 2019-03-27 NOTE — Progress Notes (Signed)
GYNECOLOGY  VISIT  CC:   Urinary frequency   HPI: 43 y.o. G12P2012 Divorced Black or [place] American female here for urinary frequency and urgency that started about 7 days.  She is going to the bathroom more frequently.  She is having pelvic pressure with symptoms.  She is with a different partner since May.  They are SA.  She is not having any low back pain.  She has not seen any blood in her urine.  Urine has been strong smelling at times.    Does have hx of fibroids with PUS last year.  Pt reports cycles are very heavy and she passes clots/tissue.  Was not interested in treatment last year but now feeling low pelvic pressure and pelvic fullness/mass.    GYNECOLOGIC HISTORY: Patient's last menstrual period was 03/07/2019 (exact date). Contraception: none Menopausal hormone therapy: none  Patient Active Problem List   Diagnosis Date Noted  . Vitamin D deficiency 12/18/2018  . Sciatica of right side 10/07/2017  . OCD (obsessive compulsive disorder) 09/23/2015  . Yeast vaginitis 03/21/2014  . Seasonal and perennial allergic rhinitis 03/21/2014  . Endometriosis of pelvic peritoneum 11/21/2012  . Acute upper respiratory infections of unspecified site 11/04/2012  . Coryza 11/04/2012  . Allergic urticaria 05/27/2012  . Insomnia 05/27/2012  . Sarcoid ([place]) 02/01/2012    Past Medical History:  Diagnosis Date  . Endometriosis   . Hyperlipidemia   . Sarcoid     Past Surgical History:  Procedure Laterality Date  . CESAREAN SECTION    . LAPAROTOMY  2003   missing one tube and ovary? Endometriosis  . NECK SURGERY  01-24-12   removed lymph node    MEDS:   Current Outpatient Medications on File Prior to Visit  Medication Sig Dispense Refill  . cholecalciferol (VITAMIN D3) 25 MCG (1000 UT) tablet Take 1,000 Units by mouth daily.    . ferrous sulfate 325 (65 FE) MG EC tablet Take 325 mg by mouth daily with breakfast.     No current facility-administered medications on file prior to  visit.     ALLERGIES: Ceftin [cefuroxime axetil] and Latex  Family History  Problem Relation Age of Onset  . Asthma Father        childhood-smoker  . Asthma Sister   . Asthma Brother   . Diabetes Paternal Grandmother   . Breast cancer Maternal Aunt   . Diabetes Maternal Grandmother   . Hypertension Maternal Grandmother   . Endometriosis Cousin     SH:  Divorced, non smoker  Review of Systems  Genitourinary: Positive for frequency and urgency.       Pressure   All other systems reviewed and are negative.   PHYSICAL EXAMINATION:    BP 110/60   Pulse 76   Temp 97.7 F (36.5 C) (Temporal)   Ht 5\' 5"  (1.651 m)   Wt 159 lb 6.4 oz (72.3 kg)   LMP 03/07/2019 (Exact Date)   BMI 26.53 kg/m     General appearance: alert, cooperative and appears stated age Flank:  No CVA tenderness Abdomen: soft, non-tender; bowel sounds normal; lower pelvic mass that is palpated near the bladder,  no organomegaly Lymph:  no inguinal LAD noted  Pelvic: External genitalia:  no lesions              Urethra:  normal appearing urethra with no masses, tenderness or lesions              Bartholins and Skenes: normal  Vagina: normal appearing vagina with normal color and discharge, no lesions              Cervix: no lesions              Bimanual Exam:  Uterus:  enlarged, 12-14 weeks size, firm and nodular              Adnexa: no mass, fullness, tenderness  Chaperone was present for exam.  Assessment: Urinary pressure and urgency, no dysuria Enlarged fibroid uterus Menorrhagia   Plan: Urine culture pending.  Bactrim DS bid x 3 days to pharmacy. GC/Chl/trich obtained If this is negative, consider trial of vaginal estrogen cream Uterine findings d/w today.  Compared to prior exam with Dr. Talbert Nan, feel uterus is increased in size and fibroids likely larger.  This may be the cause of her more recent symptoms.  She desires additional information about fibroids and treatment.  This  was discussed with pt including treatment options.  She does not desire hysterectomy but would consider myomectomy and [place].  ACOG bulletin about fibroids given to pt for review.  Recommended she consider repeat PUS if above testing is all negative.    ~25 minutes spent with patient >50% of time was in face to face discussion of above.

## 2019-03-28 LAB — CBC
Hematocrit: 35.3 % (ref 34.0–46.6)
Hemoglobin: 11.1 g/dL (ref 11.1–15.9)
MCH: 25.9 pg — ABNORMAL LOW (ref 26.6–33.0)
MCHC: 31.4 g/dL — ABNORMAL LOW (ref 31.5–35.7)
MCV: 82 fL (ref 79–97)
Platelets: 432 10*3/uL (ref 150–450)
RBC: 4.29 x10E6/uL (ref 3.77–5.28)
RDW: 14.3 % (ref 11.7–15.4)
WBC: 6.9 10*3/uL (ref 3.4–10.8)

## 2019-03-28 LAB — URINALYSIS, MICROSCOPIC ONLY
Bacteria, UA: NONE SEEN
Casts: NONE SEEN /lpf
Epithelial Cells (non renal): >10 /hpf — AB (ref 0–10)

## 2019-03-28 LAB — FERRITIN: Ferritin: 14 ng/mL — ABNORMAL LOW (ref 15–150)

## 2019-03-28 LAB — VITAMIN D 25 HYDROXY (VIT D DEFICIENCY, FRACTURES): Vit D, 25-Hydroxy: 20.6 ng/mL — ABNORMAL LOW (ref 30.0–100.0)

## 2019-03-29 LAB — URINE CULTURE

## 2019-03-30 ENCOUNTER — Telehealth: Payer: Self-pay | Admitting: *Deleted

## 2019-03-30 NOTE — Telephone Encounter (Signed)
-----   Message from Megan Salon, MD sent at 03/29/2019 10:20 PM EDT ----- Please let pt know her urine culture was negative.  Ok to finish the antibiotics and then give update.  We discussed trial of vaginal estrogen cream and if no improvement, she will need PUS and consultation with Dr. Talbert Nan.

## 2019-03-30 NOTE — Telephone Encounter (Signed)
Left voice mail to call back 

## 2019-03-31 LAB — CHLAMYDIA/GONOCOCCUS/TRICHOMONAS, NAA
Chlamydia by NAA: NEGATIVE
Gonococcus by NAA: NEGATIVE
Trich vag by NAA: NEGATIVE

## 2019-03-31 NOTE — Telephone Encounter (Signed)
LM for pt to call back. Second attempt  

## 2019-04-02 NOTE — Telephone Encounter (Signed)
Result note sent to patient on MyChart. Patient did not return phone call.

## 2019-04-09 ENCOUNTER — Telehealth: Payer: Self-pay

## 2019-04-09 NOTE — Telephone Encounter (Signed)
Left message to call [place] at [phone] at 03/31/2019 and 04/09/2019.

## 2019-04-09 NOTE — Telephone Encounter (Signed)
-----   Message from Salvadore Dom, MD sent at 03/30/2019  1:00 PM EDT ----- I would recommend that the patient take oral magnesium 500 mg a day to counter the constipation from the oral iron. If she still can't tolerate it, then I would refer her for an iron transfusion.  As for the vit d, her level is better than it was a year ago, but still low. I would recommend she try and take 5,000 IU of vit d daily (or at least the 4 x a week). I would recheck a CBC, ferritin and vit d in 3 months.

## 2019-04-14 NOTE — Telephone Encounter (Signed)
Dr.Jertson, I have been unable to reach this patient x 2. Would you like me to send a letter?

## 2019-04-14 NOTE — Telephone Encounter (Signed)
Yes, please send a letter.

## 2019-04-20 NOTE — Telephone Encounter (Signed)
Letter to Dr.Jertson. 

## 2019-05-01 NOTE — Telephone Encounter (Signed)
Letter mailed to patient's home address. Encounter closed.

## 2019-05-26 ENCOUNTER — Ambulatory Visit (INDEPENDENT_AMBULATORY_CARE_PROVIDER_SITE_OTHER): Payer: Medicaid Other

## 2019-05-26 ENCOUNTER — Encounter: Payer: Self-pay | Admitting: Internal Medicine

## 2019-05-26 ENCOUNTER — Other Ambulatory Visit: Payer: Self-pay

## 2019-05-26 ENCOUNTER — Ambulatory Visit: Payer: Medicaid Other | Admitting: Internal Medicine

## 2019-05-26 VITALS — BP 118/64 | HR 88 | Temp 98.0°F | Ht 65.0 in | Wt 165.2 lb

## 2019-05-26 DIAGNOSIS — D869 Sarcoidosis, unspecified: Secondary | ICD-10-CM | POA: Diagnosis not present

## 2019-05-26 DIAGNOSIS — J410 Simple chronic bronchitis: Secondary | ICD-10-CM

## 2019-05-26 DIAGNOSIS — F5101 Primary insomnia: Secondary | ICD-10-CM | POA: Diagnosis not present

## 2019-05-26 MED ORDER — TRAZODONE HCL 50 MG PO TABS: ORAL_TABLET | ORAL | 5 refills | Status: DC

## 2019-05-26 NOTE — Progress Notes (Signed)
HPI female never smoker followed for sarcoid/node biopsy positive, urticaria Had sustained prednisone for exacerbation in December 2017  CT scan of chest the 01/22/2012 showed focal airspace disease right upper lobe. Peri-bronchovascular nodular opacity was also seen at the left hilum and more confluent disease in the left lower lobe. There is widespread mediastinal adenopathy. Imaging of the abdomen demonstrated splenomegaly with miliary lesions in the liver and abdominal adenopathy. Differential diagnosis was between sarcoid and lymphoma. PPD skin test was negative.  ACE >100 01/30/12 Biopsy right cervical node 01/23/2012/[facility]- non- necrotizing granulomatous inflammation negative for organisms. ACE level 07/14/15-  62 ( 53, 58 1 yr ago) PFT 08/19/17-difusion slightly reduced.  FVC 2.84/89%, FEV1 2.62/99%, ratio 1.92, TLC 81%, DLCO 75% -------------------------------------------------------------------------------------------------------   11/04/2017- 43 year old female pot smoker followed for sarcoid/node biopsy positive, urticaria, Insomnia Had sustained prednisone for exacerbation in December 2017.  ----FOLLOWS FOR: review pft.  c/o increased prod cough with clear mucus.   PFT 08/19/2017-minimal diffusion deficit.  No response to bronchodilator.  FVC 2.84/89%, FEV1 2.62/99%, ratio 0.92, FEF 25-75% 3.80/131%, TLC 81%, DLCO 75% ACE 08/19/17--41 Persistent morning cough with clear phlegm, no fever.  Changed from smoking pot to drinking it from a blender to help with chronic disc disease pain.  She had been seen in orthopedics with no follow-up.  Says they did not respond to her phone calls. Temazepam help sleep quality only temporarily. Ophthalmology/Dr. Gershon Crane has given her an ointment to help eyes.  Apparently there might be some sarcoid component to her eye irritation. Her primary complaint low back pain with right sciatica.  She says she cannot afford surgery.  05/26/2019-  43 year old female  pot smoker followed for Sarcoid/node biopsy positive, urticaria,Chronic bronchitis, Insomnia, complicated by Iritis, Low Back Pain/ Sciatica -----Patient reports that her sob is at her baselline. She reports that she is primarily having trouble staying alseep at night for the past month.  Infrequent cough, no phlegm or fever. Declines flu vax. L4-5 disk and R sciatica are problems in day but not affecting sleep much. Trying to "eat myself to sleep", to help herself relax.    ROS-see HPI  + = positive Constitutional:   weight loss,? night sweats,No- fevers, chills, fatigue, lassitude. HEENT:   No-  headaches, difficulty swallowing, tooth/dental problems, sore throat,       No-  sneezing, itching, ear ache, nasal congestion, post nasal drip,  CV:  No-   chest pain, orthopnea, PND, swelling in lower extremities, anasarca, dizziness, palpitations Resp: +shortness of breath with exertion or at rest.            +   productive cough,  No-non-productive cough,  No- coughing up of blood.              No-   change in color of mucus.  No- wheezing.   Skin:    no- rash/ hives  GI:  No-   heartburn, indigestion, abdominal pain, nausea, vomiting,  GU:  MS:     joint pain or swelling. + low back pain/ sciatica Neuro-     nothing unusual Psych:  No-change in mood or affect. + depression or anxiety.  No memory loss.  OBJ- Physical Exam General- Alert, Oriented, Affect-appropriate, Distress- none acute.  Skin- clear with no visible rash Lymphadenopathy- none Head- atraumatic            Eyes- Gross vision intact, PERRLA, conjunctivae and secretions clear. + Eyes look normal  Ears- Hearing, canals-normal            Nose- Clear, no-Septal dev, mucus, polyps, erosion, perforation             Throat- Mallampati II , mucosa clear , drainage- none, tonsils- atrophic. Torus Neck- flexible , trachea midline, no stridor , thyroid nl, carotid no bruit Chest - symmetrical excursion ,  unlabored           Heart/CV- RRR , no murmur , no gallop  , no rub, nl s1 s2                           - JVD- none , edema- none, stasis changes- none, varices- none           Lung-  Cough- none, dullness-none, rub- none           Chest wall-  Abd-  Br/ Gen/ Rectal- Not done, not indicated Extrem- cyanosis- none, clubbing, none, atrophy- none, strength- nl Neuro- grossly intact to observation

## 2019-05-26 NOTE — Progress Notes (Signed)
Pt aware of results.  Nothing further needed.  

## 2019-05-26 NOTE — Patient Instructions (Signed)
Script sent for trazodone for sleep  Order- cxr   Dx sarcoid  Please call if we can help

## 2019-05-30 DIAGNOSIS — J410 Simple chronic bronchitis: Secondary | ICD-10-CM | POA: Insufficient documentation

## 2019-05-30 NOTE — Assessment & Plan Note (Signed)
Clinically in remission. Significant relapse becomes progressively less likely with age. Plan- continue to watch.

## 2019-05-30 NOTE — Assessment & Plan Note (Signed)
Primary or psychophysiologic. Sleep hygiene discussed, emphasizing relaxation. She is willing to try Trazodone, with discussion. Advised against "eating herself to sleep". Consider Cognitive Behavioral Therapy.

## 2019-05-30 NOTE — Assessment & Plan Note (Signed)
Likely reflects her old sarcoid and her ongoing pot-smoking for pain control.  Plan- CXR update. Advised against smoking.

## 2019-07-11 IMAGING — XA Imaging study
2 series · 2 of 2 positions shown · non-contrast
Comparison: none

CLINICAL DATA: Right lower extremity radiculopathy. Displacement of
the L4-5 lumbar disc.

[Series 2: ortho standard · 1 of 1 slices shown (1 of 2)]
[im 1/1]
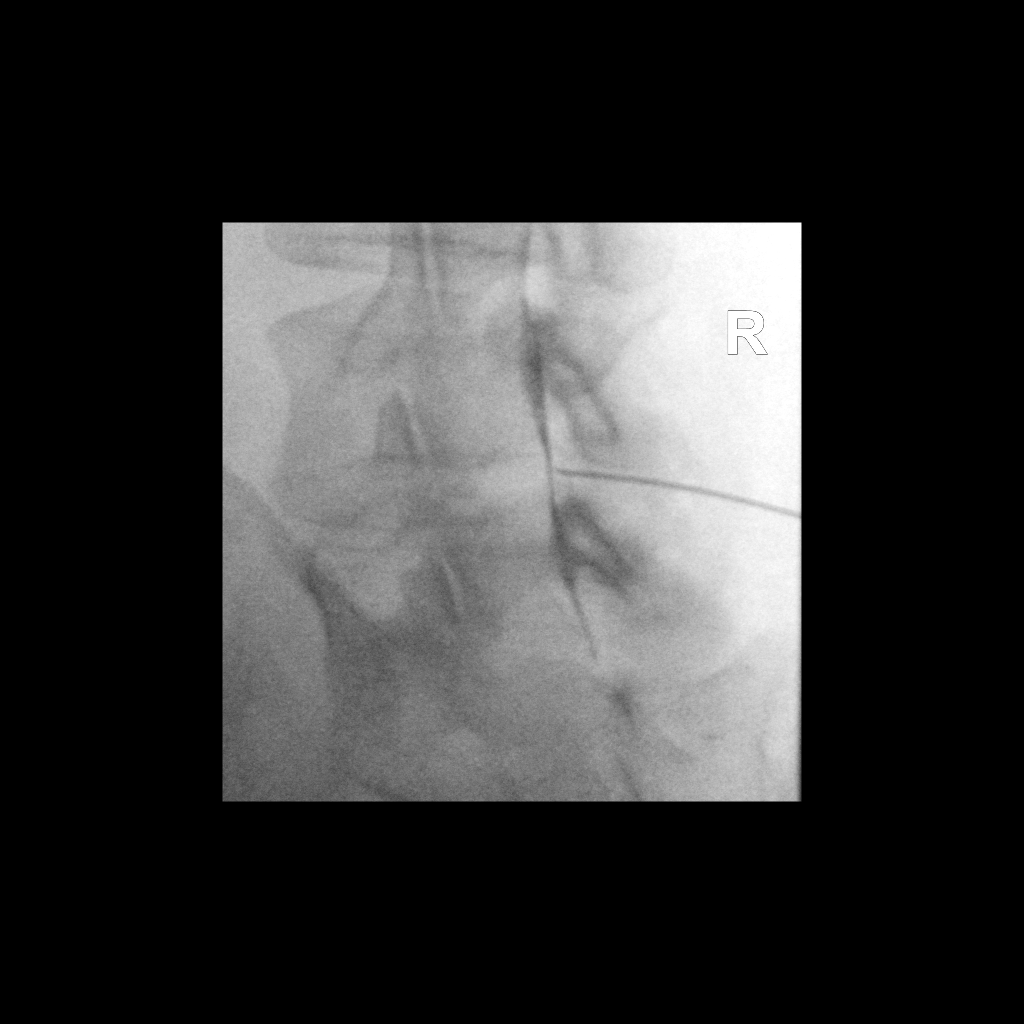

[Series 3: ortho standard · 1 of 1 slices shown (2 of 2)]
[im 1/1]
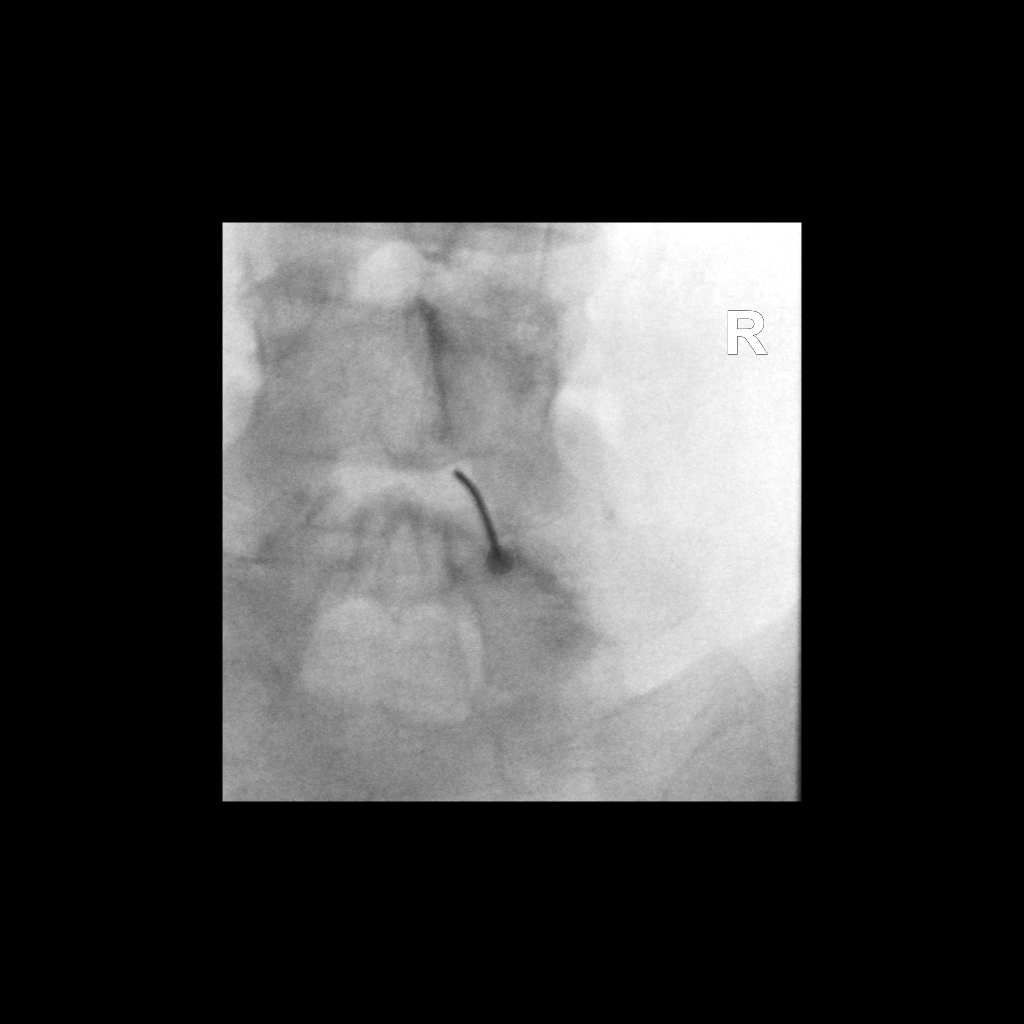

[2 of 2 positions shown; findings below may reference images not displayed]

FLUOROSCOPY TIME:  Radiation Exposure Index (as provided by the
fluoroscopic device): 18.4 uGy*m2

Fluoroscopy Time:  18 seconds

Number of Acquired Images:  0

PROCEDURE:
The procedure, risks, benefits, and alternatives were explained to
the patient. Questions regarding the procedure were encouraged and
answered. The patient understands and consents to the procedure.

LUMBAR EPIDURAL INJECTION:

An interlaminar approach was performed on right at L4-5. The
overlying skin was cleansed and anesthetized. A 20 gauge epidural
needle was advanced using loss-of-resistance technique.

DIAGNOSTIC EPIDURAL INJECTION:

Injection of Isovue-M 200 shows a good epidural pattern with spread
above and below the level of needle placement, primarily on the
right no vascular opacification is seen.

THERAPEUTIC EPIDURAL INJECTION:

120 mg of Depo-Medrol mixed with 2 mL 1% lidocaine were instilled.
The procedure was well-tolerated, and the patient was discharged
thirty minutes following the injection in good condition.

COMPLICATIONS:
None
IMPRESSION: Technically successful epidural injection on the right L4-5 # 1

## 2019-08-21 ENCOUNTER — Other Ambulatory Visit: Payer: Self-pay

## 2019-08-21 ENCOUNTER — Ambulatory Visit (INDEPENDENT_AMBULATORY_CARE_PROVIDER_SITE_OTHER): Payer: Medicaid Other | Admitting: Family Medicine

## 2019-08-21 ENCOUNTER — Encounter: Payer: Self-pay | Admitting: Family Medicine

## 2019-08-21 DIAGNOSIS — M62830 Muscle spasm of back: Secondary | ICD-10-CM | POA: Diagnosis not present

## 2019-08-21 MED ORDER — PREDNISONE 50 MG PO TABS: ORAL_TABLET | ORAL | 0 refills | Status: DC

## 2019-08-21 MED ORDER — KETOROLAC TROMETHAMINE 60 MG/2ML IM SOLN
60.0000 mg | Freq: Once | INTRAMUSCULAR | Status: AC
  Administered 2019-08-21: 60 mg via INTRAMUSCULAR

## 2019-08-21 MED ORDER — METHYLPREDNISOLONE ACETATE 80 MG/ML IJ SUSP
80.0000 mg | Freq: Once | INTRAMUSCULAR | Status: AC
  Administered 2019-08-21: 80 mg via INTRAMUSCULAR

## 2019-08-21 MED ORDER — TIZANIDINE HCL 4 MG PO TABS: 4.0000 mg | ORAL_TABLET | Freq: Every day | ORAL | 0 refills | Status: DC

## 2019-08-21 NOTE — Assessment & Plan Note (Signed)
Patient is a morbid back spasm.  Seems to be more of the hip flexor.  Cocktail given today, discussed icing regimen and home exercise, discussed which activities to do which wants to avoid.  Patient is to increase activity as tolerated.  Follow-up again in 4 to 8 weeks 2 weeks

## 2019-08-21 NOTE — Patient Instructions (Addendum)
Zanaflex at night Prednisone for 5 days See me again in 2-3 weeks

## 2019-08-21 NOTE — Progress Notes (Signed)
[place] Phone: [phone] Subjective:   Fontaine No, am serving as a scribe for Dr. Hulan Saas. This visit occurred during the SARS-CoV-2 public health emergency.  Safety protocols were in place, including screening questions prior to the visit, additional usage of staff PPE, and extensive cleaning of exam room while observing appropriate contact time as indicated for disinfecting solutions.   I'm seeing this patient by the request  of:  Patient, No Pcp Per  CC: Low back pain  [id number]   12/17/2017 Worsening symptoms with worsening radicular symptoms.  Positive straight leg test with weakness.  1+ deep tendon reflexes.  Concern for worsening herniated disc.  Patient could be a candidate for epidurals.  I do feel that advanced imaging is warranted at this time.  MRI ordered.  Depending on findings we will discuss further.  Patient will be put on restrictions at work.  Encourage gabapentin to increase and see if this will be beneficial in the interim.  Update 08/21/2019 Sara Nelson is a 44 y.o. female coming in with complaint of back pain. Last epidural 12/17/2018. Patient states that this morning she bent over to clean her floor and felt a pull in the left glute. Patient is having hard time walking and performing sit to stand. Pain is 10/10. Denies any radiating symptoms. Did have relief from last epidural.  Patient states some of this just happened today.  Severe amount of pain.      Past Medical History:  Diagnosis Date  . Endometriosis   . Hyperlipidemia   . Sarcoid    Past Surgical History:  Procedure Laterality Date  . CESAREAN SECTION    . LAPAROTOMY  2003   missing one tube and ovary? Endometriosis  . NECK SURGERY  01-24-12   removed lymph node   Social History   Socioeconomic History  . Marital status: Divorced    Spouse name: Not on file  . Number of children: 2  . Years of  education: Not on file  . Highest education level: Not on file  Occupational History  . Occupation: Art therapist  Tobacco Use  . Smoking status: Never Smoker  . Smokeless tobacco: Never Used  . Tobacco comment: patient smokes THC every night   Substance and Sexual Activity  . Alcohol use: Not Currently  . Drug use: Not Currently  . Sexual activity: Not Currently    Partners: Male    Birth control/protection: None  Other Topics Concern  . Not on file  Social History Narrative  . Not on file   Social Determinants of Health   Financial Resource Strain:   . Difficulty of Paying Living Expenses: Not on file  Food Insecurity:   . Worried About Charity fundraiser in the Last Year: Not on file  . Ran Out of Food in the Last Year: Not on file  Transportation Needs:   . Lack of Transportation (Medical): Not on file  . Lack of Transportation (Non-Medical): Not on file  Physical Activity:   . Days of Exercise per Week: Not on file  . Minutes of Exercise per Session: Not on file  Stress:   . Feeling of Stress : Not on file  Social Connections:   . Frequency of Communication with Friends and Family: Not on file  . Frequency of Social Gatherings with Friends and Family: Not on file  . Attends Religious Services: Not on file  . Active Member  of Clubs or Organizations: Not on file  . Attends Archivist Meetings: Not on file  . Marital Status: Not on file   Allergies  Allergen Reactions  . Ceftin [Cefuroxime Axetil] Shortness Of Breath  . Latex    Family History  Problem Relation Age of Onset  . Asthma Father        childhood-smoker  . Asthma Sister   . Asthma Brother   . Diabetes Paternal Grandmother   . Breast cancer Maternal Aunt   . Diabetes Maternal Grandmother   . Hypertension Maternal Grandmother   . Endometriosis Cousin     Current Outpatient Medications (Endocrine & Metabolic):  .  predniSONE (DELTASONE) 50 MG tablet, Take one tablet daily for the  next 5 days.      Current Outpatient Medications (Other):  .  cholecalciferol (VITAMIN D3) 25 MCG (1000 UT) tablet, Take 1,000 Units by mouth daily. .  traZODone (DESYREL) 50 MG tablet, 1 or 2 for sleep as needed .  tiZANidine (ZANAFLEX) 4 MG tablet, Take 1 tablet (4 mg total) by mouth at bedtime.    Past medical history, social, surgical and family history all reviewed in electronic medical record.  No pertanent information unless stated regarding to the chief complaint.   Review of Systems:  No headache, visual changes, nausea, vomiting, diarrhea, constipation, dizziness, abdominal pain, skin rash, fevers, chills, night sweats, weight loss, swollen lymph nodes, body aches, joint swelling, chest pain, shortness of breath, mood changes. POSITIVE muscle aches  Objective  Blood pressure 112/74, pulse 70, height 5\' 5"  (1.651 m), weight 171 lb (77.6 kg), SpO2 98 %.   General: No apparent distress alert and oriented x3 mood and affect normal, dressed appropriately.  HEENT: Pupils equal, extraocular movements intact  Respiratory: Patient's speak in full sentences and does not appear short of breath  Cardiovascular: No lower extremity edema, non tender, no erythema  Skin: Warm dry intact with no signs of infection or rash on extremities or on axial skeleton.  Abdomen: Soft nontender  Neuro: Cranial nerves II through XII are intact, neurovascularly intact in all extremities with 2+ DTRs and 2+ pulses.  Lymph: No lymphadenopathy of posterior or anterior cervical chain or axillae bilaterally.  Gait mild antalgic MSK:  Non tender with full range of motion and good stability and symmetric strength and tone of shoulders, elbows, wrist, hip, knee and ankles bilaterally.  Back exam does have some mild loss of lordosis.  Patient does have some severe tightness in the back spasm of the left side of the back from L2-L5.  Patient has difficulty doing any flexion or extension or even rotation to the right  secondary to the pain.  Mild positive worsening pain with straight leg test but no radicular symptoms on the left side.  Deep tendon reflexes intact.  Neurovascularly intact distally   Impression and Recommendations:     This case required medical decision making of moderate complexity. The above documentation has been reviewed and is accurate and complete Lyndal Pulley, DO       Note: This dictation was prepared with Dragon dictation along with smaller phrase technology. Any transcriptional errors that result from this process are unintentional.

## 2019-09-04 ENCOUNTER — Ambulatory Visit (INDEPENDENT_AMBULATORY_CARE_PROVIDER_SITE_OTHER): Payer: Medicaid Other | Admitting: Family Medicine

## 2019-09-04 ENCOUNTER — Encounter: Payer: Self-pay | Admitting: Family Medicine

## 2019-09-04 ENCOUNTER — Other Ambulatory Visit: Payer: Self-pay

## 2019-09-04 DIAGNOSIS — M62830 Muscle spasm of back: Secondary | ICD-10-CM

## 2019-09-04 NOTE — Assessment & Plan Note (Signed)
Patient back spasm is significantly better.  Has had spinal stenosis but did respond extremely well to the epidural in May and had not had any significant radicular symptoms.  Still some very mild left-sided discomfort on exam today but patient should do well with conservative therapy see me again as needed

## 2019-09-04 NOTE — Progress Notes (Signed)
[place] [address] [place] Phone: [phone] Subjective:   I Sara Nelson am serving as a Education administrator for Dr. Hulan Saas.  This visit occurred during the SARS-CoV-2 public health emergency.  Safety protocols were in place, including screening questions prior to the visit, additional usage of staff PPE, and extensive cleaning of exam room while observing appropriate contact time as indicated for disinfecting solutions.   I'm seeing this patient by the request  of:  Patient, No Pcp Per  CC: Back pain follow-up  [id number]  Sara Nelson is a 44 y.o. female coming in with complaint of back pain. Patient is doing better.  Patient last exam seem to be having more muscle spasm than true radicular symptoms.  Patient states that she is feeling 90% better.  Still aware of some pain on the left side.  Patient has been able to do daily activities fine.  Only had to take muscle relaxers for 3 nights and still has plenty at the moment.      Past Medical History:  Diagnosis Date  . Endometriosis   . Hyperlipidemia   . Sarcoid    Past Surgical History:  Procedure Laterality Date  . CESAREAN SECTION    . LAPAROTOMY  2003   missing one tube and ovary? Endometriosis  . NECK SURGERY  01-24-12   removed lymph node   Social History   Socioeconomic History  . Marital status: Divorced    Spouse name: Not on file  . Number of children: 2  . Years of education: Not on file  . Highest education level: Not on file  Occupational History  . Occupation: Art therapist  Tobacco Use  . Smoking status: Never Smoker  . Smokeless tobacco: Never Used  . Tobacco comment: patient smokes THC every night   Substance and Sexual Activity  . Alcohol use: Not Currently  . Drug use: Not Currently  . Sexual activity: Not Currently    Partners: Male    Birth control/protection: None  Other Topics Concern  . Not on file  Social History Narrative    . Not on file   Social Determinants of Health   Financial Resource Strain:   . Difficulty of Paying Living Expenses: Not on file  Food Insecurity:   . Worried About Charity fundraiser in the Last Year: Not on file  . Ran Out of Food in the Last Year: Not on file  Transportation Needs:   . Lack of Transportation (Medical): Not on file  . Lack of Transportation (Non-Medical): Not on file  Physical Activity:   . Days of Exercise per Week: Not on file  . Minutes of Exercise per Session: Not on file  Stress:   . Feeling of Stress : Not on file  Social Connections:   . Frequency of Communication with Friends and Family: Not on file  . Frequency of Social Gatherings with Friends and Family: Not on file  . Attends Religious Services: Not on file  . Active Member of Clubs or Organizations: Not on file  . Attends Archivist Meetings: Not on file  . Marital Status: Not on file   Allergies  Allergen Reactions  . Ceftin [Cefuroxime Axetil] Shortness Of Breath  . Latex    Family History  Problem Relation Age of Onset  . Asthma Father        childhood-smoker  . Asthma Sister   . Asthma Brother   . Diabetes Paternal  Grandmother   . Breast cancer Maternal Aunt   . Diabetes Maternal Grandmother   . Hypertension Maternal Grandmother   . Endometriosis Cousin     Current Outpatient Medications (Endocrine & Metabolic):  .  predniSONE (DELTASONE) 50 MG tablet, Take one tablet daily for the next 5 days.      Current Outpatient Medications (Other):  .  cholecalciferol (VITAMIN D3) 25 MCG (1000 UT) tablet, Take 1,000 Units by mouth daily. Marland Kitchen  tiZANidine (ZANAFLEX) 4 MG tablet, Take 1 tablet (4 mg total) by mouth at bedtime. .  traZODone (DESYREL) 50 MG tablet, 1 or 2 for sleep as needed   Reviewed prior external information including notes and imaging from  primary care provider As well as notes that were available from care everywhere and other healthcare systems.  Past  medical history, social, surgical and family history all reviewed in electronic medical record.  No pertanent information unless stated regarding to the chief complaint.   Review of Systems:  No headache, visual changes, nausea, vomiting, diarrhea, constipation, dizziness, abdominal pain, skin rash, fevers, chills, night sweats, weight loss, swollen lymph nodes, body aches, joint swelling, chest pain, shortness of breath, mood changes. POSITIVE muscle aches  Objective  Blood pressure 122/80, pulse 79, height 5\' 5"  (1.651 m), weight 170 lb (77.1 kg), SpO2 98 %.   General: No apparent distress alert and oriented x3 mood and affect normal, dressed appropriately.  HEENT: Pupils equal, extraocular movements intact  Respiratory: Patient's speak in full sentences and does not appear short of breath  Cardiovascular: No lower extremity edema, non tender, no erythema  Skin: Warm dry intact with no signs of infection or rash on extremities or on axial skeleton.  Abdomen: Soft nontender  Neuro: Cranial nerves II through XII are intact, neurovascularly intact in all extremities with 2+ DTRs and 2+ pulses.  Lymph: No lymphadenopathy of posterior or anterior cervical chain or axillae bilaterally.  Gait normal with good balance and coordination.  MSK:  Non tender with full range of motion and good stability and symmetric strength and tone of shoulders, elbows, wrist, hip, knee and ankles bilaterally.  Low back exam still shows the patient does have some tightness noted.  Patient has no negative straight leg test but does have tightness of the hamstring on the left side.  Deep tendon reflexes intact symmetric neurovascular intact distally.   Impression and Recommendations:      The above documentation has been reviewed and is accurate and complete Lyndal Pulley, DO       Note: This dictation was prepared with Dragon dictation along with smaller phrase technology. Any transcriptional errors that result  from this process are unintentional.

## 2019-12-24 ENCOUNTER — Other Ambulatory Visit: Payer: Self-pay

## 2019-12-24 ENCOUNTER — Encounter: Payer: Self-pay | Admitting: Obstetrics and Gynecology

## 2019-12-24 ENCOUNTER — Ambulatory Visit: Payer: Medicaid Other | Admitting: Obstetrics and Gynecology

## 2019-12-24 VITALS — BP 100/62 | HR 102 | Temp 97.9°F | Ht 64.75 in | Wt 163.0 lb

## 2019-12-24 DIAGNOSIS — Z01419 Encounter for gynecological examination (general) (routine) without abnormal findings: Secondary | ICD-10-CM

## 2019-12-24 DIAGNOSIS — D259 Leiomyoma of uterus, unspecified: Secondary | ICD-10-CM

## 2019-12-24 DIAGNOSIS — N920 Excessive and frequent menstruation with regular cycle: Secondary | ICD-10-CM

## 2019-12-24 DIAGNOSIS — Z0000 Encounter for general adult medical examination without abnormal findings: Secondary | ICD-10-CM | POA: Diagnosis not present

## 2019-12-24 DIAGNOSIS — N946 Dysmenorrhea, unspecified: Secondary | ICD-10-CM

## 2019-12-24 DIAGNOSIS — E559 Vitamin D deficiency, unspecified: Secondary | ICD-10-CM

## 2019-12-24 NOTE — Patient Instructions (Signed)
EXERCISE AND DIET:  We recommended that you start or continue a regular exercise program for good health. Regular exercise means any activity that makes your heart beat faster and makes you sweat.  We recommend exercising at least 30 minutes per day at least 3 days a week, preferably 4 or 5.  We also recommend a diet low in fat and sugar.  Inactivity, poor dietary choices and obesity can cause diabetes, heart attack, stroke, and kidney damage, among others.    ALCOHOL AND SMOKING:  Women should limit their alcohol intake to no more than 7 drinks/beers/glasses of wine (combined, not each!) per week. Moderation of alcohol intake to this level decreases your risk of breast cancer and liver damage. And of course, no recreational drugs are part of a healthy lifestyle.  And absolutely no smoking or even second hand smoke. Most people know smoking can cause heart and lung diseases, but did you know it also contributes to weakening of your bones? Aging of your skin?  Yellowing of your teeth and nails?  CALCIUM AND VITAMIN D:  Adequate intake of calcium and Vitamin D are recommended.  The recommendations for exact amounts of these supplements seem to change often, but generally speaking 1,000 mg of calcium (between diet and supplement) and 800 units of Vitamin D per day seems prudent. Certain women may benefit from higher intake of Vitamin D.  If you are among these women, your doctor will have told you during your visit.    PAP SMEARS:  Pap smears, to check for cervical cancer or precancers,  have traditionally been done yearly, although recent scientific advances have shown that most women can have pap smears less often.  However, every woman still should have a physical exam from her gynecologist every year. It will include a breast check, inspection of the vulva and vagina to check for abnormal growths or skin changes, a visual exam of the cervix, and then an exam to evaluate the size and shape of the uterus and  ovaries.  And after 43 years of age, a rectal exam is indicated to check for rectal cancers. We will also provide age appropriate advice regarding health maintenance, like when you should have certain vaccines, screening for sexually transmitted diseases, bone density testing, colonoscopy, mammograms, etc.   MAMMOGRAMS:  All women over 40 years old should have a yearly mammogram. Many facilities now offer a "3D" mammogram, which may cost around $50 extra out of pocket. If possible,  we recommend you accept the option to have the 3D mammogram performed.  It both reduces the number of women who will be called back for extra views which then turn out to be normal, and it is better than the routine mammogram at detecting truly abnormal areas.    COLON CANCER SCREENING: Now recommend starting at age 45. At this time colonoscopy is not covered for routine screening until 50. There are take home tests that can be done between 45-49.   COLONOSCOPY:  Colonoscopy to screen for colon cancer is recommended for all women at age 50.  We know, you hate the idea of the prep.  We agree, BUT, having colon cancer and not knowing it is worse!!  Colon cancer so often starts as a polyp that can be seen and removed at colonscopy, which can quite literally save your life!  And if your first colonoscopy is normal and you have no family history of colon cancer, most women don't have to have it again for   10 years.  Once every ten years, you can do something that may end up saving your life, right?  We will be happy to help you get it scheduled when you are ready.  Be sure to check your insurance coverage so you understand how much it will cost.  It may be covered as a preventative service at no cost, but you should check your particular policy.      Breast Self-Awareness Breast self-awareness means being familiar with how your breasts look and feel. It involves checking your breasts regularly and reporting any changes to your  health care provider. Practicing breast self-awareness is important. A change in your breasts can be a sign of a serious medical problem. Being familiar with how your breasts look and feel allows you to find any problems early, when treatment is more likely to be successful. All women should practice breast self-awareness, including women who have had breast implants. How to do a breast self-exam One way to learn what is normal for your breasts and whether your breasts are changing is to do a breast self-exam. To do a breast self-exam: Look for Changes  1. Remove all the clothing above your waist. 2. Stand in front of a mirror in a room with good lighting. 3. Put your hands on your hips. 4. Push your hands firmly downward. 5. Compare your breasts in the mirror. Look for differences between them (asymmetry), such as: ? Differences in shape. ? Differences in size. ? Puckers, dips, and bumps in one breast and not the other. 6. Look at each breast for changes in your skin, such as: ? Redness. ? Scaly areas. 7. Look for changes in your nipples, such as: ? Discharge. ? Bleeding. ? Dimpling. ? Redness. ? A change in position. Feel for Changes Carefully feel your breasts for lumps and changes. It is best to do this while lying on your back on the floor and again while sitting or standing in the shower or tub with soapy water on your skin. Feel each breast in the following way:  Place the arm on the side of the breast you are examining above your head.  Feel your breast with the other hand.  Start in the nipple area and make  inch (2 cm) overlapping circles to feel your breast. Use the pads of your three middle fingers to do this. Apply light pressure, then medium pressure, then firm pressure. The light pressure will allow you to feel the tissue closest to the skin. The medium pressure will allow you to feel the tissue that is a little deeper. The firm pressure will allow you to feel the tissue  close to the ribs.  Continue the overlapping circles, moving downward over the breast until you feel your ribs below your breast.  Move one finger-width toward the center of the body. Continue to use the  inch (2 cm) overlapping circles to feel your breast as you move slowly up toward your collarbone.  Continue the up and down exam using all three pressures until you reach your armpit.  Write Down What You Find  Write down what is normal for each breast and any changes that you find. Keep a written record with breast changes or normal findings for each breast. By writing this information down, you do not need to depend only on memory for size, tenderness, or location. Write down where you are in your menstrual cycle, if you are still menstruating. If you are having trouble noticing differences   in your breasts, do not get discouraged. With time you will become more familiar with the variations in your breasts and more comfortable with the exam. How often should I examine my breasts? Examine your breasts every month. If you are breastfeeding, the best time to examine your breasts is after a feeding or after using a breast pump. If you menstruate, the best time to examine your breasts is 5-7 days after your period is over. During your period, your breasts are lumpier, and it may be more difficult to notice changes. When should I see my health care provider? See your health care provider if you notice:  A change in shape or size of your breasts or nipples.  A change in the skin of your breast or nipples, such as a reddened or scaly area.  Unusual discharge from your nipples.  A lump or thick area that was not there before.  Pain in your breasts.  Anything that concerns you.  

## 2019-12-24 NOTE — Progress Notes (Signed)
44 y.o. [id number] Divorced Black or African American Not Hispanic or Latino female here for annual exam.  Patient states that her periods are irregular she says that days 1-4 of her period are heavy.  She has a known fibroid uterus with heavy flow and severe dysmenorrhea. She hasn't tolerated the IUD or OCP's in the past.  Cycles are every 3-3.5 weeks. She is saturating a large pad in 30 minutes, bleeding over. This is a change from changing a pad in 2.5 hours. Cramps are bad, but not as bad as they used to be.  Period Duration (Days): 7-9 Period Pattern: (!) Irregular Menstrual Flow: Heavy Menstrual Control: Maxi pad Menstrual Control Change Freq (Hours): 1 Dysmenorrhea: (!) Moderate Dysmenorrhea Symptoms: Cramping  Patient's last menstrual period was 12/24/2019.          Sexually active: No.  The current method of family planning is none.    Exercising: No.  The patient does not participate in regular exercise at present. Smoker:  no  Health Maintenance: Pap:  10/28/2017 WNL NEG HPV, 07/2016 [facility], 10/26/13 negative  History of abnormal Pap:  Yes h/o surgery on cervix over 8 years ago.  MMG:  None  BMD:   None  Colonoscopy: none  TDaP:  unsure Gardasil:10/28/2017 completed 1, does not want to complete series    reports that she has never smoked. She has never used smokeless tobacco. She reports previous alcohol use. She reports previous drug use. No ETOH. Working as a Art therapist. Daughter is 13, son is 14.   Past Medical History:  Diagnosis Date  . Endometriosis   . Hyperlipidemia   . Sarcoid     Past Surgical History:  Procedure Laterality Date  . CESAREAN SECTION    . LAPAROTOMY  2003   missing one tube and ovary? Endometriosis  . NECK SURGERY  01-24-12   removed lymph node    No current outpatient medications on file.   No current facility-administered medications for this visit.    Family History  Problem Relation Age of Onset  .  Asthma Father        childhood-smoker  . Asthma Sister   . Asthma Brother   . Diabetes Paternal Grandmother   . Breast cancer Maternal Aunt   . Diabetes Maternal Grandmother   . Hypertension Maternal Grandmother   . Endometriosis Cousin     Review of Systems  Genitourinary: Positive for vaginal bleeding.  All other systems reviewed and are negative.   Exam:   BP 100/62   Pulse (!) 102   Temp 97.9 F (36.6 C)   Ht 5' 4.75" (1.645 m)   Wt 163 lb (73.9 kg)   LMP 12/24/2019   SpO2 99%   BMI 27.33 kg/m   Weight change: @WEIGHTCHANGE @ Height:   Height: 5' 4.75" (164.5 cm)  Ht Readings from Last 3 Encounters:  12/24/19 5' 4.75" (1.645 m)  09/04/19 5\' 5"  (1.651 m)  08/21/19 5\' 5"  (1.651 m)    General appearance: alert, cooperative and appears stated age Head: Normocephalic, without obvious abnormality, atraumatic Neck: no adenopathy, supple, symmetrical, trachea midline and thyroid normal to inspection and palpation Lungs: clear to auscultation bilaterally Cardiovascular: regular rate and rhythm Breasts: normal appearance, no masses or tenderness Abdomen: soft, non-tender; non distended,  no masses,  no organomegaly Extremities: extremities normal, atraumatic, no cyanosis or edema Skin: Skin color, texture, turgor normal. No rashes or lesions Lymph nodes: Cervical, supraclavicular, and axillary nodes normal. No abnormal  inguinal nodes palpated Neurologic: Grossly normal   Pelvic: External genitalia:  no lesions              Urethra:  normal appearing urethra with no masses, tenderness or lesions              Bartholins and Skenes: normal                 Vagina: normal appearing vagina with normal color and discharge, no lesions              Cervix: no lesions and anterior               Bimanual Exam:  Uterus:  retroverted, ~10-12 week sized, mildly tender, irregular, decreased mobility.               Adnexa: no mass, fullness, tenderness               Rectovaginal:  Confirms               Anus:  normal sphincter tone, no lesions  Gae Dry chaperoned for the exam.  A:  Well Woman with normal exam  Fibroid uterus  Worsening menorrhagia, hasn't tolerated OCP's or mirena IUD in the past  Dysmenorrhea  Vit d def  P:   No pap this year  Return for GYN ultrasound  Mammogram, # given, patient will schedule  Discussed breast self exam  Discussed calcium and vit D intake  Screening labs, vit D

## 2019-12-25 LAB — COMPREHENSIVE METABOLIC PANEL
ALT: 7 IU/L (ref 0–32)
AST: 17 IU/L (ref 0–40)
Albumin/Globulin Ratio: 1.3 (ref 1.2–2.2)
Albumin: 4 g/dL (ref 3.8–4.8)
Alkaline Phosphatase: 67 IU/L (ref 48–121)
BUN/Creatinine Ratio: 10 (ref 9–23)
BUN: 9 mg/dL (ref 6–24)
Bilirubin Total: 0.5 mg/dL (ref 0.0–1.2)
CO2: 22 mmol/L (ref 20–29)
Calcium: 9.1 mg/dL (ref 8.7–10.2)
Chloride: 105 mmol/L (ref 96–106)
Creatinine, Ser: 0.94 mg/dL (ref 0.57–1.00)
GFR calc Af Amer: 87 mL/min/{1.73_m2} (ref >59)
GFR calc non Af Amer: 75 mL/min/{1.73_m2} (ref >59)
Globulin, Total: 3.2 g/dL (ref 1.5–4.5)
Glucose: 98 mg/dL (ref 65–99)
Potassium: 3.9 mmol/L (ref 3.5–5.2)
Sodium: 140 mmol/L (ref 134–144)
Total Protein: 7.2 g/dL (ref 6.0–8.5)

## 2019-12-25 LAB — CBC
Hematocrit: 34.3 % (ref 34.0–46.6)
Hemoglobin: 10.5 g/dL — ABNORMAL LOW (ref 11.1–15.9)
MCH: 24 pg — ABNORMAL LOW (ref 26.6–33.0)
MCHC: 30.6 g/dL — ABNORMAL LOW (ref 31.5–35.7)
MCV: 78 fL — ABNORMAL LOW (ref 79–97)
Platelets: 541 10*3/uL — ABNORMAL HIGH (ref 150–450)
RBC: 4.38 x10E6/uL (ref 3.77–5.28)
RDW: 15.3 % (ref 11.7–15.4)
WBC: 9.4 10*3/uL (ref 3.4–10.8)

## 2019-12-25 LAB — LIPID PANEL
Chol/HDL Ratio: 3.3 ratio (ref 0.0–4.4)
Cholesterol, Total: 199 mg/dL (ref 100–199)
HDL: 60 mg/dL (ref >39)
LDL Chol Calc (NIH): 128 mg/dL — ABNORMAL HIGH (ref 0–99)
Triglycerides: 58 mg/dL (ref 0–149)
VLDL Cholesterol Cal: 11 mg/dL (ref 5–40)

## 2019-12-25 LAB — FERRITIN: Ferritin: 10 ng/mL — ABNORMAL LOW (ref 15–150)

## 2019-12-25 LAB — VITAMIN D 25 HYDROXY (VIT D DEFICIENCY, FRACTURES): Vit D, 25-Hydroxy: 17.9 ng/mL — ABNORMAL LOW (ref 30.0–100.0)

## 2019-12-26 ENCOUNTER — Other Ambulatory Visit: Payer: Self-pay | Admitting: Obstetrics and Gynecology

## 2019-12-26 DIAGNOSIS — D500 Iron deficiency anemia secondary to blood loss (chronic): Secondary | ICD-10-CM

## 2019-12-26 DIAGNOSIS — E559 Vitamin D deficiency, unspecified: Secondary | ICD-10-CM

## 2019-12-29 ENCOUNTER — Telehealth: Payer: Self-pay | Admitting: Obstetrics and Gynecology

## 2019-12-29 NOTE — Telephone Encounter (Signed)
Call to patient. Patient currently at work and unavailable to speak. Requested patient to return call to [facility] to review benefits and schedule recommended Pelvic ultrasound with Sumner Boast, MD. Patient agreeable.

## 2019-12-31 NOTE — Telephone Encounter (Signed)
Spoke with patient regarding benefits for recommended ultrasound. Patient is aware that ultrasound is transvaginal. Patient acknowledges understanding of information presented. Patient is aware of cancellation policy. Patient scheduled appointment for 01/14/2020 at 1100AM with Sumner Boast, MD. Encounter closed.

## 2020-01-14 ENCOUNTER — Encounter: Payer: Self-pay | Admitting: Obstetrics and Gynecology

## 2020-01-14 ENCOUNTER — Ambulatory Visit (INDEPENDENT_AMBULATORY_CARE_PROVIDER_SITE_OTHER): Payer: Medicaid Other

## 2020-01-14 ENCOUNTER — Ambulatory Visit (INDEPENDENT_AMBULATORY_CARE_PROVIDER_SITE_OTHER): Payer: Medicaid Other | Admitting: Obstetrics and Gynecology

## 2020-01-14 ENCOUNTER — Other Ambulatory Visit: Payer: Self-pay | Admitting: Obstetrics and Gynecology

## 2020-01-14 ENCOUNTER — Other Ambulatory Visit: Payer: Self-pay

## 2020-01-14 VITALS — BP 108/70 | HR 76 | Temp 98.1°F | Ht 64.75 in | Wt 163.0 lb

## 2020-01-14 DIAGNOSIS — D500 Iron deficiency anemia secondary to blood loss (chronic): Secondary | ICD-10-CM

## 2020-01-14 DIAGNOSIS — N761 Subacute and chronic vaginitis: Secondary | ICD-10-CM

## 2020-01-14 DIAGNOSIS — D259 Leiomyoma of uterus, unspecified: Secondary | ICD-10-CM

## 2020-01-14 DIAGNOSIS — N920 Excessive and frequent menstruation with regular cycle: Secondary | ICD-10-CM

## 2020-01-14 DIAGNOSIS — Z113 Encounter for screening for infections with a predominantly sexual mode of transmission: Secondary | ICD-10-CM

## 2020-01-14 MED ORDER — BETAMETHASONE VALERATE 0.1 % EX OINT
1.0000 | TOPICAL_OINTMENT | Freq: Two times a day (BID) | CUTANEOUS | 0 refills | Status: DC
Start: 2020-01-14 — End: 2020-07-25

## 2020-01-14 MED ORDER — NORETHINDRONE 0.35 MG PO TABS: 1.0000 | ORAL_TABLET | Freq: Every day | ORAL | 0 refills | Status: DC

## 2020-01-14 NOTE — Patient Instructions (Signed)

## 2020-01-14 NOTE — Progress Notes (Signed)
GYNECOLOGY  VISIT   HPI: 44 y.o.   Divorced Black or [place] American Not Hispanic or Latino  female   [phone] with Patient's last menstrual period was 12/24/2019.   here for pelvic ultrasound. She has a know fibroid uterus with menorrhagia and severe dysmenorrhea. She hasn't tolerated OCP's or the mirena IUD in the past. CBC from last month showed anemia with a Hgb of 10.5, she had low iron stores with ferritin of 10. She tried iron, caused constipation so she stopped.   Patient complains of having vaginal irritation for the last few weeks. She c/o very mild itching, slight increase in thin and thick vaginal d/c, tan color. No burning. No odor.   GYNECOLOGIC HISTORY: Patient's last menstrual period was 12/24/2019. Contraception:none, not sexually active. Menopausal hormone therapy: none        OB History    Gravida  3   Para  2   Term  2   Preterm      AB  1   Living  2     SAB      TAB      Ectopic      Multiple      Live Births  2              Patient Active Problem List   Diagnosis Date Noted  . Back spasm 08/21/2019  . Simple chronic bronchitis ([place]) 05/30/2019  . Vitamin D deficiency 12/18/2018  . Sciatica of right side 10/07/2017  . OCD (obsessive compulsive disorder) 09/23/2015  . Yeast vaginitis 03/21/2014  . Seasonal and perennial allergic rhinitis 03/21/2014  . Endometriosis of pelvic peritoneum 11/21/2012  . Acute upper respiratory infections of unspecified site 11/04/2012  . Coryza 11/04/2012  . Allergic urticaria 05/27/2012  . Insomnia 05/27/2012  . Sarcoid ([place]) 02/01/2012    Past Medical History:  Diagnosis Date  . Endometriosis   . Hyperlipidemia   . Sarcoid     Past Surgical History:  Procedure Laterality Date  . CESAREAN SECTION    . LAPAROTOMY  2003   missing one tube and ovary? Endometriosis  . NECK SURGERY  01-24-12   removed lymph node    No current outpatient medications on file.   No current facility-administered  medications for this visit.     ALLERGIES: Ceftin [cefuroxime axetil] and Latex  Family History  Problem Relation Age of Onset  . Asthma Father        childhood-smoker  . Asthma Sister   . Asthma Brother   . Diabetes Paternal Grandmother   . Breast cancer Maternal Aunt   . Diabetes Maternal Grandmother   . Hypertension Maternal Grandmother   . Endometriosis Cousin     Social History   Socioeconomic History  . Marital status: Divorced    Spouse name: Not on file  . Number of children: 2  . Years of education: Not on file  . Highest education level: Not on file  Occupational History  . Occupation: Art therapist  Tobacco Use  . Smoking status: Never Smoker  . Smokeless tobacco: Never Used  . Tobacco comment: patient smokes THC every night   Vaping Use  . Vaping Use: Never used  Substance and Sexual Activity  . Alcohol use: Not Currently  . Drug use: Not Currently  . Sexual activity: Not Currently    Partners: Male    Birth control/protection: None  Other Topics Concern  . Not on file  Social History Narrative  . Not on  file   Social Determinants of Health   Financial Resource Strain:   . Difficulty of Paying Living Expenses:   Food Insecurity:   . Worried About Charity fundraiser in the Last Year:   . Arboriculturist in the Last Year:   Transportation Needs:   . Film/video editor (Medical):   Marland Kitchen Lack of Transportation (Non-Medical):   Physical Activity:   . Days of Exercise per Week:   . Minutes of Exercise per Session:   Stress:   . Feeling of Stress :   Social Connections:   . Frequency of Communication with Friends and Family:   . Frequency of Social Gatherings with Friends and Family:   . Attends Religious Services:   . Active Member of Clubs or Organizations:   . Attends Archivist Meetings:   Marland Kitchen Marital Status:   Intimate Partner Violence:   . Fear of Current or Ex-Partner:   . Emotionally Abused:   Marland Kitchen Physically Abused:   .  Sexually Abused:     Review of Systems  Constitutional: Negative.   HENT: Negative.   Eyes: Negative.   Respiratory: Negative.   Cardiovascular: Negative.   Gastrointestinal: Negative.   Genitourinary:       Vaginal irritation  Musculoskeletal: Negative.   Skin: Negative.   Neurological: Negative.   Endo/Heme/Allergies: Negative.   Psychiatric/Behavioral: Negative.     PHYSICAL EXAMINATION:    BP 108/70 (BP Location: Right Arm, Patient Position: Sitting, Cuff Size: Normal)   Pulse 76   Temp 98.1 F (36.7 C) (Temporal)   Ht 5' 4.75" (1.645 m)   Wt 163 lb (73.9 kg)   LMP 12/24/2019   BMI 27.33 kg/m     General appearance: alert, cooperative and appears stated age  Pelvic: External genitalia:  no lesions              Urethra:  normal appearing urethra with no masses, tenderness or lesions              Bartholins and Skenes: normal                 Vagina: normal appearing vagina with slight increase in creamy, white vaginal d/c              Cervix: no lesions               Chaperone was present for exam.  Wet prep: no clue, no trich, few wbc KOH: no yeast PH: 4   Ultrasound images reviewed with the patient. The uterus measures 14.62 x 7.97 x 6.66 cm, 16 fibroids measured the largest was just under 4 cm. Prior ultrasound from 2019 the uterus measured 11.24 x 5.53 x 7 cm, only 4 myomas were recorded, largest 3.5 cm.   ASSESSMENT Enlarging fibroid uterus Worsening menorrhagia leading to anemia Anemia Severe dysmenorrhea Mild vaginitis symptoms, negative vaginal slides    PLAN She hasn't tolerated OCP's or the mirena IUD Will try the minipill Discussed the option of oriahnn, information given Hasn't tolerated oral iron Will set up a hematology consultation Send nuswab vaginitis panel Steroid ointment for prn use  In addition to reviewing the ultrasound results and wet prep, ~25 minutes was spent evaluating vaginitis symptoms and discussing management options.

## 2020-01-18 LAB — NUSWAB VAGINITIS (VG)
Candida albicans, NAA: NEGATIVE
Candida glabrata, NAA: NEGATIVE
Trich vag by NAA: NEGATIVE

## 2020-03-28 ENCOUNTER — Other Ambulatory Visit: Payer: Self-pay

## 2020-03-28 ENCOUNTER — Ambulatory Visit (HOSPITAL_COMMUNITY)
Admission: EM | Admit: 2020-03-28 | Discharge: 2020-03-28 | Disposition: A | Discharge status: Home / Self Care | Payer: Medicaid Other | Attending: Internal Medicine | Admitting: Internal Medicine

## 2020-03-28 DIAGNOSIS — Z79899 Other long term (current) drug therapy: Secondary | ICD-10-CM | POA: Diagnosis not present

## 2020-03-28 DIAGNOSIS — J069 Acute upper respiratory infection, unspecified: Secondary | ICD-10-CM | POA: Diagnosis not present

## 2020-03-28 DIAGNOSIS — E785 Hyperlipidemia, unspecified: Secondary | ICD-10-CM | POA: Diagnosis not present

## 2020-03-28 DIAGNOSIS — R05 Cough: Secondary | ICD-10-CM | POA: Diagnosis present

## 2020-03-28 DIAGNOSIS — Z881 Allergy status to other antibiotic agents status: Secondary | ICD-10-CM | POA: Insufficient documentation

## 2020-03-28 DIAGNOSIS — D869 Sarcoidosis, unspecified: Secondary | ICD-10-CM | POA: Insufficient documentation

## 2020-03-28 DIAGNOSIS — U071 COVID-19: Secondary | ICD-10-CM | POA: Diagnosis not present

## 2020-03-28 LAB — SARS CORONAVIRUS 2 (TAT 6-24 HRS): SARS Coronavirus 2: POSITIVE — AB

## 2020-03-28 MED ORDER — ACETAMINOPHEN 325 MG PO TABS
ORAL_TABLET | ORAL | Status: AC
  Filled 2020-03-28: qty 2

## 2020-03-28 MED ORDER — PROMETHAZINE-DM 6.25-15 MG/5ML PO SYRP: 5.0000 mL | ORAL_SOLUTION | Freq: Four times a day (QID) | ORAL | 0 refills | Status: DC | PRN

## 2020-03-28 NOTE — ED Provider Notes (Signed)
[name]    CSN: [id number] Arrival date & time: 03/28/20  6226      History   Chief Complaint Chief Complaint  Patient presents with  . Cough    HPI Sara Nelson is a 44 y.o. female presents to urgent care today with complaints of cough.  Patient reports 1 week history of wheezing, dry cough, chills and body aches.  Patient also notes some difficulty catching breath during conversation.  She has had her temperature checked at work over the week and has not been running a fever.  States she visited her father last weekend who had similar symptoms.  Her husband is also in urgent care today with similar symptoms.  Patient denies any chest pain, sore throat, abdominal pain, N/V/D, no dysuria, no loss of smell or taste.  Patient states she is tolerating p.o.'s well.  She has not received the Covid vaccine.  Patient noted to have a temperature of 101.4 upon arrival to urgent care.   Past Medical History:  Diagnosis Date  . Endometriosis   . Hyperlipidemia   . Sarcoid     Patient Active Problem List   Diagnosis Date Noted  . Back spasm 08/21/2019  . Simple chronic bronchitis ([place]) 05/30/2019  . Vitamin D deficiency 12/18/2018  . Sciatica of right side 10/07/2017  . OCD (obsessive compulsive disorder) 09/23/2015  . Yeast vaginitis 03/21/2014  . Seasonal and perennial allergic rhinitis 03/21/2014  . Endometriosis of pelvic peritoneum 11/21/2012  . Acute upper respiratory infections of unspecified site 11/04/2012  . Coryza 11/04/2012  . Allergic urticaria 05/27/2012  . Insomnia 05/27/2012  . Sarcoid ([place]) 02/01/2012    Past Surgical History:  Procedure Laterality Date  . CESAREAN SECTION    . LAPAROTOMY  2003   missing one tube and ovary? Endometriosis  . NECK SURGERY  01-24-12   removed lymph node    OB History    Gravida  3   Para  2   Term  2   Preterm      AB  1   Living  2     SAB      TAB      Ectopic      Multiple      Live  Births  2            Home Medications    Prior to Admission medications   Medication Sig Start Date End Date Taking? Authorizing Provider  betamethasone valerate ointment (VALISONE) 0.1 % Apply 1 application topically 2 (two) times daily. You can use this for up to 2 weeks as needed 01/14/20  Yes Salvadore Dom, MD  norethindrone (MICRONOR) 0.35 MG tablet Take 1 tablet (0.35 mg total) by mouth daily. 01/14/20  Yes Salvadore Dom, MD  traZODone (DESYREL) 50 MG tablet TAKE 1 TO 2 TABLETS BY MOUTH FOR SLEEP AS NEEDED 03/25/20  Yes [provider]  promethazine-dextromethorphan (PROMETHAZINE-DM) 6.25-15 MG/5ML syrup Take 5 mLs by mouth 4 (four) times daily as needed for cough. 03/28/20   Rudolpho Sevin, NP    Family History Family History  Problem Relation Age of Onset  . Asthma Father        childhood-smoker  . Asthma Sister   . Asthma Brother   . Diabetes Paternal Grandmother   . Breast cancer Maternal Aunt   . Diabetes Maternal Grandmother   . Hypertension Maternal Grandmother   . Endometriosis Cousin     Social History Social History  Tobacco Use  . Smoking status: Never Smoker  . Smokeless tobacco: Never Used  . Tobacco comment: patient smokes THC every night   Vaping Use  . Vaping Use: Never used  Substance Use Topics  . Alcohol use: Not Currently  . Drug use: Not Currently     Allergies   Ceftin [cefuroxime axetil] and Latex   Review of Systems As stated in HPI otherwise negative   Physical Exam Triage Vital Signs ED Triage Vitals  Enc Vitals Group     BP 03/28/20 1025 106/67     Pulse Rate 03/28/20 1025 95     Resp 03/28/20 1025 19     Temp 03/28/20 1025 (!) 101.4 F (38.6 C)     Temp Source 03/28/20 1025 Oral     SpO2 03/28/20 1025 100 %     Weight --      Height --      Head Circumference --      Peak Flow --      Pain Score 03/28/20 1022 0     Pain Loc --      Pain Edu? --      Excl. in [address]? --    No data  found.  Updated Vital Signs BP 106/67 (BP Location: Right Arm)   Pulse 95   Temp (!) 101.4 F (38.6 C) (Oral)   Resp 19   LMP 03/17/2020   SpO2 100%      Physical Exam Constitutional:      General: She is not in acute distress.    Appearance: Normal appearance.  HENT:     Right Ear: Tympanic membrane normal.     Left Ear: Tympanic membrane normal.     Nose: Congestion present.     Mouth/Throat:     Mouth: Mucous membranes are moist.     Pharynx: No oropharyngeal exudate or posterior oropharyngeal erythema.  Cardiovascular:     Rate and Rhythm: Normal rate and regular rhythm.  Pulmonary:     Effort: Pulmonary effort is normal.     Breath sounds: Normal breath sounds. No wheezing or rales.  Abdominal:     General: Bowel sounds are normal.     Palpations: Abdomen is soft.  Musculoskeletal:     Cervical back: Normal range of motion and neck supple. No rigidity.  Skin:    General: Skin is warm and dry.  Neurological:     General: No focal deficit present.     Mental Status: She is alert and oriented to person, place, and time.  Psychiatric:        Mood and Affect: Mood normal.        Behavior: Behavior normal.      UC Treatments / Results  Labs (all labs ordered are listed, but only abnormal results are displayed) Labs Reviewed  SARS CORONAVIRUS 2 (TAT 6-24 HRS) - Abnormal; Notable for the following components:      Result Value   SARS Coronavirus 2 POSITIVE (*)    All other components within normal limits    EKG   Radiology No results found.  Procedures Procedures (including critical care time)  Medications Ordered in UC Medications - No data to display  Initial Impression / Assessment and Plan / UC Course  I have reviewed the triage vital signs and the nursing notes.  Pertinent labs & imaging results that were available during my care of the patient were reviewed by me and considered in my medical decision making (see chart  for  details).  Covid-19 -PCR +  -VSS.pt is non-toxic appearing with no evidence of pna -discussed isoloation guidelines and return to work criteria -encouraged fluids, rest, tylenol prn for fever, prn cough suppressant rx'd -f/u or go to ER for worsening symtpoms  Final Clinical Impressions(s) / UC Diagnoses   Final diagnoses:  COVID-19     Discharge Instructions     Your symptoms are suspicious for Covid as we discussed.  You will need to isolate and await your test results.  If test results are positive you will need to isolate for 10 days from symptom onset and fever free for 24 hours.  Make sure you are drinking plenty of fluids.  Take Tylenol or Motrin for fever.  Take cough suppressant as needed    ED Prescriptions    Medication Sig Dispense Auth. Provider   promethazine-dextromethorphan (PROMETHAZINE-DM) 6.25-15 MG/5ML syrup Take 5 mLs by mouth 4 (four) times daily as needed for cough. 118 mL Rudolpho Sevin, NP     PDMP not reviewed this encounter.   Rudolpho Sevin, NP 03/29/20 1047

## 2020-03-28 NOTE — Discharge Instructions (Addendum)
Your symptoms are suspicious for Covid as we discussed.  You will need to isolate and await your test results.  If test results are positive you will need to isolate for 10 days from symptom onset and fever free for 24 hours.  Make sure you are drinking plenty of fluids.  Take Tylenol or Motrin for fever.  Take cough suppressant as needed

## 2020-03-28 NOTE — ED Triage Notes (Signed)
Pt presents with dry cough, body aches and chills, headaches, SOB.   Denies loss of taste or smell, n,v,d, chest pain.

## 2020-03-29 ENCOUNTER — Telehealth: Payer: Self-pay | Admitting: Family

## 2020-03-29 NOTE — Telephone Encounter (Signed)
Called to Discuss with patient about Covid symptoms and the use of the monoclonal antibody infusion for those with mild to moderate Covid symptoms and at a high risk of hospitalization.     Pt appears to qualify for this infusion due to co-morbid conditions and/or a member of an at-risk group in accordance with the FDA Emergency Use Authorization.    Ms. Mcwherter was seen in the [facility] with 1 week history of wheezing, dry cough, chills, and body aches. Febrile while at Urgent Care. Risk factors include heart disease.  Unable to reach via phone. Voicemail left and message sent via MyChart.   Terri Piedra, NP

## 2020-04-21 ENCOUNTER — Other Ambulatory Visit: Payer: Self-pay

## 2020-04-21 ENCOUNTER — Ambulatory Visit: Payer: Medicaid Other | Admitting: Obstetrics and Gynecology

## 2020-04-21 ENCOUNTER — Encounter: Payer: Self-pay | Admitting: Obstetrics and Gynecology

## 2020-04-21 VITALS — BP 116/74 | HR 80 | Wt 158.4 lb

## 2020-04-21 DIAGNOSIS — D500 Iron deficiency anemia secondary to blood loss (chronic): Secondary | ICD-10-CM

## 2020-04-21 DIAGNOSIS — N920 Excessive and frequent menstruation with regular cycle: Secondary | ICD-10-CM

## 2020-04-21 DIAGNOSIS — N926 Irregular menstruation, unspecified: Secondary | ICD-10-CM

## 2020-04-21 DIAGNOSIS — Z113 Encounter for screening for infections with a predominantly sexual mode of transmission: Secondary | ICD-10-CM

## 2020-04-21 DIAGNOSIS — D259 Leiomyoma of uterus, unspecified: Secondary | ICD-10-CM

## 2020-04-21 LAB — POCT URINE PREGNANCY: Preg Test, Ur: NEGATIVE

## 2020-04-21 MED ORDER — TRANEXAMIC ACID 650 MG PO TABS: 1300.0000 mg | ORAL_TABLET | Freq: Three times a day (TID) | ORAL | 2 refills | Status: DC

## 2020-04-21 NOTE — Patient Instructions (Signed)
Uterine Fibroids  Uterine fibroids (leiomyomas) are noncancerous (benign) tumors that can develop in the uterus. Fibroids may also develop in the fallopian tubes, cervix, or tissues (ligaments) near the uterus. You may have one or many fibroids. Fibroids vary in size, weight, and where they grow in the uterus. Some can become quite large. Most fibroids do not require medical treatment. What are the causes? The cause of this condition is not known. What increases the risk? You are more likely to develop this condition if you:  Are in your 30s or 40s and have not gone through menopause.  Have a family history of this condition.  Are of African-American descent.  Had your first period at an early age (early menarche).  Have not had any children (nulliparity).  Are overweight or obese. What are the signs or symptoms? Many women do not have any symptoms. Symptoms of this condition may include:  Heavy menstrual bleeding.  Bleeding or spotting between periods.  Pain and pressure in the pelvic area, between the hips.  Bladder problems, such as needing to urinate urgently or more often than usual.  Inability to have children (infertility).  Failure to carry pregnancy to term (miscarriage). How is this diagnosed? This condition may be diagnosed based on:  Your symptoms and medical history.  A physical exam.  A pelvic exam that includes feeling for any tumors.  Imaging tests, such as ultrasound or MRI. How is this treated? Treatment for this condition may include:  Seeing your health care provider for follow-up visits to monitor your fibroids for any changes.  Taking NSAIDs such as ibuprofen, naproxen, or aspirin to reduce pain.  Hormone medicines. These may be taken as a pill, given in an injection, or delivered by a T-shaped device that is inserted into the uterus (intrauterine device, IUD).  Surgery to remove one of the following: ? The fibroids (myomectomy). Your health  care provider may recommend this if fibroids affect your fertility and you want to become pregnant. ? The uterus (hysterectomy). ? Blood supply to the fibroids (uterine artery embolization). Follow these instructions at home:  Take over-the-counter and prescription medicines only as told by your health care provider.  Ask your health care provider if you should take iron pills or eat more iron-rich foods, such as dark green, leafy vegetables. Heavy menstrual bleeding can cause low iron levels.  If directed, apply heat to your back or abdomen to reduce pain. Use the heat source that your health care provider recommends, such as a moist heat pack or a heating pad. ? Place a towel between your skin and the heat source. ? Leave the heat on for 20-30 minutes. ? Remove the heat if your skin turns bright red. This is especially important if you are unable to feel pain, heat, or cold. You may have a greater risk of getting burned.  Pay close attention to your menstrual cycle. Tell your health care provider about any changes, such as: ? Increased blood flow that requires you to use more pads or tampons than usual. ? A change in the number of days that your period lasts. ? A change in symptoms that are associated with your period, such as back pain or cramps in your abdomen.  Keep all follow-up visits as told by your health care provider. This is important, especially if your fibroids need to be monitored for any changes. Contact a health care provider if you:  Have pelvic pain, back pain, or cramps in your abdomen that   do not get better with medicine or heat.  Develop new bleeding between periods.  Have increased bleeding during or between periods.  Feel unusually tired or weak.  Feel light-headed. Get help right away if you:  Faint.  Have pelvic pain that suddenly gets worse.  Have severe vaginal bleeding that soaks a tampon or pad in 30 minutes or less. Summary  Uterine fibroids are  noncancerous (benign) tumors that can develop in the uterus.  The exact cause of this condition is not known.  Most fibroids do not require medical treatment unless they affect your ability to have children (fertility).  Contact a health care provider if you have pelvic pain, back pain, or cramps in your abdomen that do not get better with medicines.  Make sure you know what symptoms should cause you to get help right away. This information is not intended to replace advice given to you by your health care provider. Make sure you discuss any questions you have with your health care provider. Document Revised: 06/28/2017 Document Reviewed: 06/11/2017 Elsevier Patient Education  2020 [place]. Uterine Artery Embolization for Fibroids  Uterine artery embolization is a procedure to shrink uterine fibroids. Uterine fibroids are masses of tissue (tumors) that can develop in the womb (uterus). They are also called leiomyomas. This type of tumor is not cancerous (benign) and does not spread to other parts of the body outside of the pelvic area. The pelvic area is the part of the body between the hip bones. You can have one or many fibroids. Fibroids can vary in size, shape, weight, and where they grow in the uterus. Some can become quite large. In this procedure, a thin plastic tube (catheter) is used to inject a chemical that blocks off the blood supply to the fibroid, which causes the fibroid to shrink. Tell a health care provider about:  Any allergies you have.  All medicines you are taking, including vitamins, herbs, eye drops, creams, and over-the-counter medicines.  Any problems you or family members have had with anesthetic medicines.  Any blood disorders you have.  Any surgeries you have had.  Any medical conditions you have.  Whether you are pregnant or may be pregnant. What are the risks? Generally, this is a safe procedure. However, problems may occur,  including:  Bleeding.  Allergic reactions to medicines or dyes.  Damage to other structures or organs.  Infection, including blood infection (septicemia).  Injury to the uterus from decreased blood supply.  Lack of menstrual periods (amenorrhea).  Death of tissue cells (necrosis) around your bladder or vulva.  Development of a hole between organs or from an organ to the surface of your skin (fistula).  Blood clot in the legs (deep vein thrombosis) or lung (pulmonary embolus).  Nausea and vomiting. What happens before the procedure? Staying hydrated Follow instructions from your health care provider about hydration, which may include:  Up to 2 hours before the procedure - you may continue to drink clear liquids, such as water, clear fruit juice, black coffee, and plain tea. Eating and drinking restrictions Follow instructions from your health care provider about eating and drinking, which may include:  8 hours before the procedure - stop eating heavy meals or foods such as meat, fried foods, or fatty foods.  6 hours before the procedure - stop eating light meals or foods, such as toast or cereal.  6 hours before the procedure - stop drinking milk or drinks that contain milk.  2 hours before the  procedure - stop drinking clear liquids. Medicines  Ask your health care provider about: ? Changing or stopping your regular medicines. This is especially important if you are taking diabetes medicines or blood thinners. ? Taking over-the-counter medicines, vitamins, herbs, and supplements. ? Taking medicines such as aspirin and ibuprofen. These medicines can thin your blood. Do not take these medicines unless your health care provider tells you to take them.  You may be given antibiotic medicine to help prevent infection.  You may be given medicine to prevent nausea and vomiting (antiemetic). General instructions  Ask your health care provider how your surgical site will be  marked or identified.  You may be asked to shower with a germ-killing soap.  Plan to have someone take you home from the hospital or clinic.  If you will be going home right after the procedure, plan to have someone with you for 24 hours.  You will be asked to empty your bladder. What happens during the procedure?  To lower your risk of infection: ? Your health care team will wash or sanitize their hands. ? Hair may be removed from the surgical area. ? Your skin will be washed with soap.  An IV will be inserted into one of your veins.  You will be given one or more of the following: ? A medicine to help you relax (sedative). ? A medicine to numb the area (local anesthetic).  A small cut (incision) will be made in your groin.  A catheter will be inserted into the main artery of your leg. The catheter will be guided through the artery to your uterus.  A series of images will be taken while dye is injected through the catheter in your groin. X-rays are taken at the same time. This is done to provide a road map of the blood supply to your uterus and fibroids.  Tiny plastic spheres, about the size of sand grains, will be injected through the catheter. Metal coils may be used to help block the artery. The particles will lodge in tiny branches of the uterine artery that supplies blood to the fibroids.  The procedure will be repeated on the artery that supplies the other side of the uterus.  The catheter will be removed and pressure will be applied to stop the bleeding.  A dressing will be placed over the incision. The procedure may vary among health care providers and hospitals. What happens after the procedure?  Your blood pressure, heart rate, breathing rate, and blood oxygen level will be monitored until the medicines you were given have worn off.  You will be given pain medicine as needed.  You may be given medicine for nausea and vomiting as needed.  Do not drive for 24  hours if you were given a sedative. Summary  Uterine artery embolization is a procedure to shrink uterine fibroids by blocking the blood supply to the fibroid.  You may be given a sedative and local anesthetic for the procedure.  A catheter will be inserted into the main artery of your leg. The catheter will be guided through the artery to your uterus.  After the procedure you will be given pain medicine and medicine for nausea as needed.  Do not drive for 24 hours if you were given a sedative. This information is not intended to replace advice given to you by your health care provider. Make sure you discuss any questions you have with your health care provider. Document Revised: 06/28/2017 Document Reviewed: 10/18/2016  Elsevier Patient Education  [employer].

## 2020-04-21 NOTE — Progress Notes (Signed)
GYNECOLOGY  VISIT   HPI: 44 y.o.   Divorced Black or [place] American Not Hispanic or Latino  female   [phone] with Patient's last menstrual period was 03/17/2020.   here for 3 month follow up on minipill. Took minipill for 3 weeks. Started menses 01/20/2020 and did not stop until 02/03/2020 and then came back on 02/19/2020 and then again on 03/17/2020. She is not taking the minipill any longer  The patient has a known fibroid uterus wight menorrhagia, anemia and severe dysmenorrhea. She hasn't tolerated OCP's or the mirena IUD in the past. On ultrasound in 6/21 her uterus was larger than on her prior ultrasound and 16 separate fibroids were measured.   She has been sexually active x 1, got married, not working out. Partner had negative STD testing.   GYNECOLOGIC HISTORY: Patient's last menstrual period was 03/17/2020. Contraception:not sexually active, minipill Menopausal hormone therapy: none        OB History    Gravida  3   Para  2   Term  2   Preterm      AB  1   Living  2     SAB      TAB      Ectopic      Multiple      Live Births  2              Patient Active Problem List   Diagnosis Date Noted  . Back spasm 08/21/2019  . Simple chronic bronchitis ([place]) 05/30/2019  . Vitamin D deficiency 12/18/2018  . Sciatica of right side 10/07/2017  . OCD (obsessive compulsive disorder) 09/23/2015  . Yeast vaginitis 03/21/2014  . Seasonal and perennial allergic rhinitis 03/21/2014  . Endometriosis of pelvic peritoneum 11/21/2012  . Acute upper respiratory infections of unspecified site 11/04/2012  . Coryza 11/04/2012  . Allergic urticaria 05/27/2012  . Insomnia 05/27/2012  . Sarcoid ([place]) 02/01/2012    Past Medical History:  Diagnosis Date  . Endometriosis   . History of COVID-19   . Hyperlipidemia   . Sarcoid     Past Surgical History:  Procedure Laterality Date  . CESAREAN SECTION    . LAPAROTOMY  2003   missing one tube and ovary? Endometriosis  .  NECK SURGERY  01-24-12   removed lymph node    Current Outpatient Medications  Medication Sig Dispense Refill  . betamethasone valerate ointment (VALISONE) 0.1 % Apply 1 application topically 2 (two) times daily. You can use this for up to 2 weeks as needed (Patient not taking: Reported on 04/21/2020) 15 g 0  . traZODone (DESYREL) 50 MG tablet TAKE 1 TO 2 TABLETS BY MOUTH FOR SLEEP AS NEEDED (Patient not taking: Reported on 04/21/2020)     No current facility-administered medications for this visit.     ALLERGIES: Ceftin [cefuroxime axetil] and Latex  Family History  Problem Relation Age of Onset  . Asthma Father        childhood-smoker  . Asthma Sister   . Asthma Brother   . Diabetes Paternal Grandmother   . Breast cancer Maternal Aunt   . Diabetes Maternal Grandmother   . Hypertension Maternal Grandmother   . Endometriosis Cousin     Social History   Socioeconomic History  . Marital status: Divorced    Spouse name: Not on file  . Number of children: 2  . Years of education: Not on file  . Highest education level: Not on file  Occupational History  .  Occupation: Art therapist  Tobacco Use  . Smoking status: Never Smoker  . Smokeless tobacco: Never Used  . Tobacco comment: patient smokes THC every night   Vaping Use  . Vaping Use: Never used  Substance and Sexual Activity  . Alcohol use: Not Currently  . Drug use: Not Currently  . Sexual activity: Yes    Partners: Male    Birth control/protection: None  Other Topics Concern  . Not on file  Social History Narrative  . Not on file   Social Determinants of Health   Financial Resource Strain:   . Difficulty of Paying Living Expenses: Not on file  Food Insecurity:   . Worried About Charity fundraiser in the Last Year: Not on file  . Ran Out of Food in the Last Year: Not on file  Transportation Needs:   . Lack of Transportation (Medical): Not on file  . Lack of Transportation (Non-Medical): Not on file   Physical Activity:   . Days of Exercise per Week: Not on file  . Minutes of Exercise per Session: Not on file  Stress:   . Feeling of Stress : Not on file  Social Connections:   . Frequency of Communication with Friends and Family: Not on file  . Frequency of Social Gatherings with Friends and Family: Not on file  . Attends Religious Services: Not on file  . Active Member of Clubs or Organizations: Not on file  . Attends Archivist Meetings: Not on file  . Marital Status: Not on file  Intimate Partner Violence:   . Fear of Current or Ex-Partner: Not on file  . Emotionally Abused: Not on file  . Physically Abused: Not on file  . Sexually Abused: Not on file    Review of Systems  Constitutional: Negative.   HENT: Negative.   Eyes: Negative.   Respiratory: Negative.   Cardiovascular: Negative.   Gastrointestinal: Negative.   Genitourinary:       Heavy menses  Musculoskeletal: Negative.   Skin: Negative.   Neurological: Negative.   Endo/Heme/Allergies: Negative.   Psychiatric/Behavioral: Negative.     PHYSICAL EXAMINATION:    BP 116/74 (BP Location: Right Arm, Patient Position: Sitting, Cuff Size: Normal)   Pulse 80   Wt 158 lb 6.4 oz (71.8 kg)   LMP 03/17/2020   BMI 26.56 kg/m     General appearance: alert, cooperative and appears stated age  ASSESSMENT Symptomatic fibroid uterus Anemia Dysmenorrhea Cycle is late   PLAN UPT negative Didn't tolerate OCP's, IUD, POP Doesn't want Barbette Merino Will try Lysteda Discussed myomectomy and uterine artery ligation Will look into locations that do focused ultrasound surgery for fibroids and send her for a consultation She will not get a hysterectomy because of her religion TSH, Prolactin, CBC, Ferritin  STD testing (swab self collected) Information

## 2020-04-22 ENCOUNTER — Telehealth: Payer: Self-pay | Admitting: *Deleted

## 2020-04-22 ENCOUNTER — Encounter: Payer: Self-pay | Admitting: Obstetrics and Gynecology

## 2020-04-22 LAB — HEP, RPR, HIV PANEL
HIV Screen 4th Generation wRfx: NONREACTIVE
Hepatitis B Surface Ag: NEGATIVE
RPR Ser Ql: NONREACTIVE

## 2020-04-22 LAB — CBC
Hematocrit: 31.7 % — ABNORMAL LOW (ref 34.0–46.6)
Hemoglobin: 9.6 g/dL — ABNORMAL LOW (ref 11.1–15.9)
MCH: 22.9 pg — ABNORMAL LOW (ref 26.6–33.0)
MCHC: 30.3 g/dL — ABNORMAL LOW (ref 31.5–35.7)
MCV: 76 fL — ABNORMAL LOW (ref 79–97)
Platelets: 551 10*3/uL — ABNORMAL HIGH (ref 150–450)
RBC: 4.19 x10E6/uL (ref 3.77–5.28)
RDW: 16.5 % — ABNORMAL HIGH (ref 11.7–15.4)
WBC: 7.6 10*3/uL (ref 3.4–10.8)

## 2020-04-22 LAB — PROLACTIN: Prolactin: 16.3 ng/mL (ref 4.8–23.3)

## 2020-04-22 LAB — FERRITIN: Ferritin: 17 ng/mL (ref 15–150)

## 2020-04-22 LAB — TSH: TSH: 2.02 u[IU]/mL (ref 0.450–4.500)

## 2020-04-22 NOTE — Telephone Encounter (Signed)
Call returned to [facility], spoke with pharmacist Garen Grams.  Advised patient is no longer taking norethindrone, ok to dispense tranexamic acid.   Encounter closed.

## 2020-04-22 NOTE — Telephone Encounter (Signed)
Call transferred from front office. Hui calling from pharmacy to advise on drug interaction between norethindrone and tranexamic acid.   "Hormonal Contraceptives may enhance the thrombogenic effect of Tranexamic Acid"  Does the provider still recommend dispensing tranexamic acid?   Advised I will review with provider and return call.   Dr. Talbert Nan -per review of 04/21/20 OV, patient did not tolerate POP, medication was discontinued on 04/21/20. OK to proceed with tranexamic acid?

## 2020-04-22 NOTE — Telephone Encounter (Signed)
Yes, the patient isn't on POP any longer. She didn't tolerate them. Okay to use Lysteda.

## 2020-04-25 ENCOUNTER — Telehealth: Payer: Self-pay

## 2020-04-25 DIAGNOSIS — D500 Iron deficiency anemia secondary to blood loss (chronic): Secondary | ICD-10-CM

## 2020-04-25 NOTE — Telephone Encounter (Signed)
Per review this is only being offered in certain states and [place] is not one currently. Call to patient to discuss that this is not available in our area.

## 2020-04-25 NOTE — Progress Notes (Signed)
Erroneous encounter

## 2020-04-25 NOTE — Telephone Encounter (Signed)
Patient is returning call. Patient states she would like to be reached at her work number [phone].

## 2020-04-25 NOTE — Telephone Encounter (Signed)
-----   Message from Salvadore Dom, MD sent at 04/25/2020 12:05 PM EDT ----- Please look into focused ultrasound surgery for fibroids for this patient.  Thanks, Sharee Pimple

## 2020-04-26 LAB — CHLAMYDIA/GONOCOCCUS/TRICHOMONAS, NAA
Chlamydia by NAA: NEGATIVE
Gonococcus by NAA: NEGATIVE
Trich vag by NAA: NEGATIVE

## 2020-04-26 NOTE — Telephone Encounter (Signed)
Spoke with patient. Advised of information as seen below regarding ultrasound surgery for fibroids as well as results from [place]. Patient is agreeable to see Hematology. Referral placed to [facility] to see first available provider for anemia. Patient is aware she will be contacted directly to schedule an appointment.   Salvadore Dom, MD  04/25/2020 12:07 PM EDT     Please let the patient know that her anemia is slightly worse, the rest of her blood work is normal.  I would recommend a consultation with hematology for an iron transfusion.  Hopefully the Lysteda will help. Please have her calendar her bleeding.  Verline Lema is looking into the option of focused ultrasound surgery for her.      Routing to provider and will close encounter.

## 2020-05-10 ENCOUNTER — Inpatient Hospital Stay: Payer: Medicaid Other

## 2020-05-10 ENCOUNTER — Inpatient Hospital Stay: Payer: Medicaid Other | Admitting: Family

## 2020-06-03 ENCOUNTER — Inpatient Hospital Stay: Payer: Medicaid Other | Attending: Family | Admitting: Family

## 2020-06-03 ENCOUNTER — Inpatient Hospital Stay: Payer: Medicaid Other

## 2020-06-03 ENCOUNTER — Other Ambulatory Visit: Payer: Self-pay | Admitting: Family

## 2020-06-03 DIAGNOSIS — D649 Anemia, unspecified: Secondary | ICD-10-CM

## 2020-07-25 ENCOUNTER — Inpatient Hospital Stay (HOSPITAL_BASED_OUTPATIENT_CLINIC_OR_DEPARTMENT_OTHER): Payer: Medicaid Other | Admitting: Family

## 2020-07-25 ENCOUNTER — Other Ambulatory Visit: Payer: Self-pay

## 2020-07-25 ENCOUNTER — Encounter: Payer: Self-pay | Admitting: Family

## 2020-07-25 ENCOUNTER — Inpatient Hospital Stay: Payer: Medicaid Other | Attending: Family

## 2020-07-25 DIAGNOSIS — Z79899 Other long term (current) drug therapy: Secondary | ICD-10-CM | POA: Insufficient documentation

## 2020-07-25 DIAGNOSIS — G4700 Insomnia, unspecified: Secondary | ICD-10-CM | POA: Insufficient documentation

## 2020-07-25 DIAGNOSIS — E785 Hyperlipidemia, unspecified: Secondary | ICD-10-CM | POA: Insufficient documentation

## 2020-07-25 DIAGNOSIS — D500 Iron deficiency anemia secondary to blood loss (chronic): Secondary | ICD-10-CM | POA: Diagnosis not present

## 2020-07-25 DIAGNOSIS — Z8616 Personal history of COVID-19: Secondary | ICD-10-CM | POA: Diagnosis not present

## 2020-07-25 DIAGNOSIS — D649 Anemia, unspecified: Secondary | ICD-10-CM

## 2020-07-25 LAB — SAVE SMEAR(SSMR), FOR PROVIDER SLIDE REVIEW

## 2020-07-25 LAB — CBC WITH DIFFERENTIAL (CANCER CENTER ONLY)
Abs Immature Granulocytes: 0.05 10*3/uL (ref 0.00–0.07)
Basophils Absolute: 0.1 10*3/uL (ref 0.0–0.1)
Basophils Relative: 1 %
Eosinophils Absolute: 0.2 10*3/uL (ref 0.0–0.5)
Eosinophils Relative: 3 %
HCT: 35.7 % — ABNORMAL LOW (ref 36.0–46.0)
Hemoglobin: 11.2 g/dL — ABNORMAL LOW (ref 12.0–15.0)
Immature Granulocytes: 1 %
Lymphocytes Relative: 43 %
Lymphs Abs: 2.9 10*3/uL (ref 0.7–4.0)
MCH: 24.8 pg — ABNORMAL LOW (ref 26.0–34.0)
MCHC: 31.4 g/dL (ref 30.0–36.0)
MCV: 79 fL — ABNORMAL LOW (ref 80.0–100.0)
Monocytes Absolute: 0.4 10*3/uL (ref 0.1–1.0)
Monocytes Relative: 6 %
Neutro Abs: 3.1 10*3/uL (ref 1.7–7.7)
Neutrophils Relative %: 46 %
Platelet Count: 465 10*3/uL — ABNORMAL HIGH (ref 150–400)
RBC: 4.52 MIL/uL (ref 3.87–5.11)
RDW: 16.6 % — ABNORMAL HIGH (ref 11.5–15.5)
WBC Count: 6.7 10*3/uL (ref 4.0–10.5)
nRBC: 0 % (ref 0.0–0.2)

## 2020-07-25 LAB — LACTATE DEHYDROGENASE: LDH: 141 U/L (ref 98–192)

## 2020-07-25 LAB — CMP (CANCER CENTER ONLY)
ALT: 8 U/L (ref 0–44)
AST: 14 U/L — ABNORMAL LOW (ref 15–41)
Albumin: 4 g/dL (ref 3.5–5.0)
Alkaline Phosphatase: 57 U/L (ref 38–126)
Anion gap: 7 (ref 5–15)
BUN: 11 mg/dL (ref 6–20)
CO2: 28 mmol/L (ref 22–32)
Calcium: 9.3 mg/dL (ref 8.9–10.3)
Chloride: 102 mmol/L (ref 98–111)
Creatinine: 0.89 mg/dL (ref 0.44–1.00)
GFR, Estimated: >60 mL/min (ref >60)
Glucose, Bld: 88 mg/dL (ref 70–99)
Potassium: 4 mmol/L (ref 3.5–5.1)
Sodium: 137 mmol/L (ref 135–145)
Total Bilirubin: 0.4 mg/dL (ref 0.3–1.2)
Total Protein: 7.2 g/dL (ref 6.5–8.1)

## 2020-07-25 LAB — RETICULOCYTES
Immature Retic Fract: 13.2 % (ref 2.3–15.9)
RBC.: 4.53 MIL/uL (ref 3.87–5.11)
Retic Count, Absolute: 60.2 10*3/uL (ref 19.0–186.0)
Retic Ct Pct: 1.3 % (ref 0.4–3.1)

## 2020-07-25 NOTE — Progress Notes (Signed)
Hematology/Oncology Consultation   Name: Sara Nelson      MRN: [id number]    Location: Room/bed info not found  Date: 07/25/2020 Time:1:49 PM   REFERRING PHYSICIAN: Sumner Boast, MD  REASON FOR CONSULT: Iron deficiency anemia secondary to chronic blood loss   DIAGNOSIS: iron deficiency anemia secondary to heavy, irregular cycles   HISTORY OF PRESENT ILLNESS: Sara Nelson is a very pleasant 44 yo African American female with iron deficiency anemia. She has history of uterine fibroids and endometriosis. She is not currently on birth control.  She states that her cycle is typically irregulare lasting several weeks and heavy. Her cycle this month was heavy but only lasted 7 days.  No other blood loss noted. No abnormal bruising. No petechiae.  She is symptomatic with fatigue, insomnia, chills and craves ice cream.  She tried taking oral iron with no improvement.  She states that the women on her father's side of the family as well as her daughter all have iron deficiency anemia.  She has a son and a daughter. No history of miscarriage.  No personal or familial history of cancer that she is aware of.  She has history of sarcoidosis.  No history of diabetes or thyroid disease.  No fever, chills, n/v, cough, rash, dizziness, SOB, chest pain, palpitations, abdominal pain or changes in bowel or bladder habits.  No swelling, tenderness, numbness or tingling in her extremities.  No falls or syncope.  No smoking, ETOH or recreational drug use.  She has maintained a good appetite and is staying well hydrated. Her weight is stable.  She works as a Art therapist.   ROS: All other 10 point review of systems is negative.   PAST MEDICAL HISTORY:   Past Medical History:  Diagnosis Date  . Endometriosis   . History of COVID-19   . Hyperlipidemia   . Sarcoid     ALLERGIES: Allergies  Allergen Reactions  . Ceftin [Cefuroxime Axetil] Shortness Of Breath  . Latex Rash and Other (See Comments)     Redish skin      MEDICATIONS:  Current Outpatient Medications on File Prior to Visit  Medication Sig Dispense Refill  . betamethasone valerate ointment (VALISONE) 0.1 % Apply 1 application topically 2 (two) times daily. You can use this for up to 2 weeks as needed (Patient not taking: Reported on 04/21/2020) 15 g 0  . tranexamic acid (LYSTEDA) 650 MG TABS tablet Take 2 tablets (1,300 mg total) by mouth 3 (three) times daily. 30 tablet 2  . traZODone (DESYREL) 50 MG tablet TAKE 1 TO 2 TABLETS BY MOUTH FOR SLEEP AS NEEDED (Patient not taking: Reported on 04/21/2020)     No current facility-administered medications on file prior to visit.     PAST SURGICAL HISTORY Past Surgical History:  Procedure Laterality Date  . CESAREAN SECTION    . LAPAROTOMY  2003   missing one tube and ovary? Endometriosis  . NECK SURGERY  01-24-12   removed lymph node    FAMILY HISTORY: Family History  Problem Relation Age of Onset  . Asthma Father        childhood-smoker  . Asthma Sister   . Asthma Brother   . Diabetes Paternal Grandmother   . Breast cancer Maternal Aunt   . Diabetes Maternal Grandmother   . Hypertension Maternal Grandmother   . Endometriosis Cousin     SOCIAL HISTORY:  reports that she has never smoked. She has never used smokeless tobacco. She reports previous alcohol  use. She reports previous drug use.  PERFORMANCE STATUS: The patient's performance status is 1 - Symptomatic but completely ambulatory  PHYSICAL EXAM: Most Recent Vital Signs: There were no vitals taken for this visit. BP 118/70 (BP Location: Left Arm, Patient Position: Sitting)   Pulse 84   Temp 98.2 F (36.8 C) (Oral)   Resp 18   Ht 5\' 5"  (1.651 m)   Wt 164 lb (74.4 kg)   SpO2 100%   BMI 27.29 kg/m   General Appearance:    Alert, cooperative, no distress, appears stated age  Head:    Normocephalic, without obvious abnormality, atraumatic  Eyes:    PERRL, conjunctiva/corneas clear, EOM's intact, fundi     benign, both eyes        Throat:   Lips, mucosa, and tongue normal; teeth and gums normal  Neck:   Supple, symmetrical, trachea midline, no adenopathy;    thyroid:  no enlargement/tenderness/nodules; no carotid   bruit or JVD  Back:     Symmetric, no curvature, ROM normal, no CVA tenderness  Lungs:     Clear to auscultation bilaterally, respirations unlabored  Chest Wall:    No tenderness or deformity   Heart:    Regular rate and rhythm, S1 and S2 normal, no murmur, rub   or gallop     Abdomen:     Soft, non-tender, bowel sounds active all four quadrants,    no masses, no organomegaly        Extremities:   Extremities normal, atraumatic, no cyanosis or edema  Pulses:   2+ and symmetric all extremities  Skin:   Skin color, texture, turgor normal, no rashes or lesions  Lymph nodes:   Cervical, supraclavicular, and axillary nodes normal  Neurologic:   CNII-XII intact, normal strength, sensation and reflexes    throughout    LABORATORY DATA:  Results for orders placed or performed in visit on 07/25/20 (from the past 48 hour(s))  CBC with Differential (Cancer Center Only)     Status: Abnormal   Collection Time: 07/25/20  1:15 PM  Result Value Ref Range   WBC Count 6.7 4.0 - 10.5 K/uL   RBC 4.52 3.87 - 5.11 MIL/uL   Hemoglobin 11.2 (L) 12.0 - 15.0 g/dL   HCT 07/27/20 (L) 78.2 - 95.6 %   MCV 79.0 (L) 80.0 - 100.0 fL   MCH 24.8 (L) 26.0 - 34.0 pg   MCHC 31.4 30.0 - 36.0 g/dL   RDW 21.3 (H) 08.6 - 57.8 %   Platelet Count 465 (H) 150 - 400 K/uL   nRBC 0.0 0.0 - 0.2 %   Neutrophils Relative % 46 %   Neutro Abs 3.1 1.7 - 7.7 K/uL   Lymphocytes Relative 43 %   Lymphs Abs 2.9 0.7 - 4.0 K/uL   Monocytes Relative 6 %   Monocytes Absolute 0.4 0.1 - 1.0 K/uL   Eosinophils Relative 3 %   Eosinophils Absolute 0.2 0.0 - 0.5 K/uL   Basophils Relative 1 %   Basophils Absolute 0.1 0.0 - 0.1 K/uL   Immature Granulocytes 1 %   Abs Immature Granulocytes 0.05 0.00 - 0.07 K/uL    Comment:  Performed at [facility] at [facility], [address], [place], [place]  Save Smear [facility])     Status: None   Collection Time: 07/25/20  1:15 PM  Result Value Ref Range   Smear Review SMEAR STAINED AND AVAILABLE FOR REVIEW  Comment: Performed at [facility] at [facility], [address], [place], [place] [address]  Reticulocytes     Status: None   Collection Time: 07/25/20  1:16 PM  Result Value Ref Range   Retic Ct Pct 1.3 0.4 - 3.1 %   RBC. 4.53 3.87 - 5.11 MIL/uL   Retic Count, Absolute 60.2 19.0 - 186.0 K/uL   Immature Retic Fract 13.2 2.3 - 15.9 %    Comment: Performed at [facility] at [facility], [address] [place], [place] [address]      RADIOGRAPHY: No results found.     PATHOLOGY: None  ASSESSMENT/PLAN: Ms. Delvillar is a very pleasant 44 yo African American female with iron deficiency anemia.  We will see what her iron studies look like and replace if needed.  We did check a Hgb fractionation cascade and alpha thalassemia genotype as well. She may also benefut from folic acid.  Follow-up in 7 weeks.   All questions were answered and she is in agreement with the plan. She was encouraged to contact our office with any questions or concerns. We can certainly see her sooner if needed.    Laverna Peace, NP

## 2020-07-26 ENCOUNTER — Telehealth: Payer: Self-pay | Admitting: Family

## 2020-07-26 DIAGNOSIS — D509 Iron deficiency anemia, unspecified: Secondary | ICD-10-CM | POA: Insufficient documentation

## 2020-07-26 LAB — IRON AND TIBC
Iron: 30 ug/dL — ABNORMAL LOW (ref 41–142)
Saturation Ratios: 9 % — ABNORMAL LOW (ref 21–57)
TIBC: 325 ug/dL (ref 236–444)
UIBC: 296 ug/dL (ref 120–384)

## 2020-07-26 LAB — HGB FRACTIONATION CASCADE
Hgb A2: 2.5 % (ref 1.8–3.2)
Hgb A: 97.5 % (ref 96.4–98.8)
Hgb F: 0 % (ref 0.0–2.0)
Hgb S: 0 %

## 2020-07-26 LAB — ERYTHROPOIETIN: Erythropoietin: 15.1 m[IU]/mL (ref 2.6–18.5)

## 2020-07-26 LAB — FERRITIN: Ferritin: 16 ng/mL (ref 11–307)

## 2020-07-26 NOTE — Telephone Encounter (Signed)
No los 12/27 

## 2020-08-02 ENCOUNTER — Telehealth: Payer: Self-pay | Admitting: Obstetrics and Gynecology

## 2020-08-02 ENCOUNTER — Other Ambulatory Visit: Payer: Self-pay | Admitting: Family

## 2020-08-02 NOTE — Telephone Encounter (Signed)
Please let the patient know that I received a copy of her recent visit with Hematology. Her Hgb has improved. She was given a script for lysteda at the end of 9/21, please see if she has been using it and if she feels it is helping. Please also let her know that ultrasound ablation of her fibroids is now possible here at [facility]. If she would like to further discuss please set her up for an appointment.

## 2020-08-02 NOTE — Telephone Encounter (Signed)
Message left to return call to Sheresa Cullop at [phone].   

## 2020-08-03 LAB — ALPHA-THALASSEMIA GENOTYPR

## 2020-08-12 ENCOUNTER — Other Ambulatory Visit: Payer: Self-pay

## 2020-08-12 ENCOUNTER — Inpatient Hospital Stay: Payer: Medicaid Other | Attending: Family

## 2020-08-12 VITALS — BP 93/60 | HR 86 | Temp 98.1°F | Resp 18

## 2020-08-12 DIAGNOSIS — D500 Iron deficiency anemia secondary to blood loss (chronic): Secondary | ICD-10-CM

## 2020-08-12 DIAGNOSIS — Z79899 Other long term (current) drug therapy: Secondary | ICD-10-CM | POA: Insufficient documentation

## 2020-08-12 MED ORDER — SODIUM CHLORIDE 0.9 % IV SOLN
200.0000 mg | Freq: Once | INTRAVENOUS | Status: AC
  Administered 2020-08-12: 200 mg via INTRAVENOUS
  Filled 2020-08-12: qty 200

## 2020-08-12 MED ORDER — SODIUM CHLORIDE 0.9 % IV SOLN
Freq: Once | INTRAVENOUS | Status: AC
  Filled 2020-08-12: qty 250

## 2020-08-12 NOTE — Patient Instructions (Signed)

## 2020-08-19 ENCOUNTER — Other Ambulatory Visit: Payer: Self-pay

## 2020-08-19 ENCOUNTER — Inpatient Hospital Stay: Payer: Medicaid Other

## 2020-08-19 VITALS — BP 107/75 | HR 67 | Temp 98.1°F | Resp 16

## 2020-08-19 DIAGNOSIS — D500 Iron deficiency anemia secondary to blood loss (chronic): Secondary | ICD-10-CM | POA: Diagnosis not present

## 2020-08-19 MED ORDER — SODIUM CHLORIDE 0.9 % IV SOLN
200.0000 mg | Freq: Once | INTRAVENOUS | Status: AC
  Administered 2020-08-19: 200 mg via INTRAVENOUS
  Filled 2020-08-19: qty 200

## 2020-08-19 MED ORDER — SODIUM CHLORIDE 0.9 % IV SOLN
Freq: Once | INTRAVENOUS | Status: AC
Start: 2020-08-19 — End: 2020-08-19
  Filled 2020-08-19: qty 250

## 2020-08-19 NOTE — Patient Instructions (Signed)

## 2020-09-05 NOTE — Telephone Encounter (Signed)
Call to patient. Patient states she never picked up Lysteda. Has not seem any symptom improvement. Patient would like to discuss ablation option with Dr. Talbert Nan. Consult scheduled for 09-23-20 at 2:50. Patient agreeable to date and time of appointment.   Routing to provider and will close encounter.

## 2020-09-09 ENCOUNTER — Inpatient Hospital Stay: Payer: Medicaid Other | Attending: Family

## 2020-09-09 ENCOUNTER — Inpatient Hospital Stay (HOSPITAL_BASED_OUTPATIENT_CLINIC_OR_DEPARTMENT_OTHER): Payer: Medicaid Other | Admitting: Family

## 2020-09-09 ENCOUNTER — Encounter: Payer: Self-pay | Admitting: Family

## 2020-09-09 ENCOUNTER — Other Ambulatory Visit: Payer: Self-pay

## 2020-09-09 VITALS — BP 94/52 | HR 66 | Temp 98.3°F | Resp 18 | Ht 65.0 in | Wt 164.1 lb

## 2020-09-09 DIAGNOSIS — D500 Iron deficiency anemia secondary to blood loss (chronic): Secondary | ICD-10-CM

## 2020-09-09 DIAGNOSIS — D509 Iron deficiency anemia, unspecified: Secondary | ICD-10-CM | POA: Insufficient documentation

## 2020-09-09 LAB — CBC WITH DIFFERENTIAL (CANCER CENTER ONLY)
Abs Immature Granulocytes: 0.06 10*3/uL (ref 0.00–0.07)
Basophils Absolute: 0.1 10*3/uL (ref 0.0–0.1)
Basophils Relative: 1 %
Eosinophils Absolute: 0.3 10*3/uL (ref 0.0–0.5)
Eosinophils Relative: 3 %
HCT: 36.4 % (ref 36.0–46.0)
Hemoglobin: 11.6 g/dL — ABNORMAL LOW (ref 12.0–15.0)
Immature Granulocytes: 1 %
Lymphocytes Relative: 41 %
Lymphs Abs: 3 10*3/uL (ref 0.7–4.0)
MCH: 25.9 pg — ABNORMAL LOW (ref 26.0–34.0)
MCHC: 31.9 g/dL (ref 30.0–36.0)
MCV: 81.3 fL (ref 80.0–100.0)
Monocytes Absolute: 0.4 10*3/uL (ref 0.1–1.0)
Monocytes Relative: 6 %
Neutro Abs: 3.5 10*3/uL (ref 1.7–7.7)
Neutrophils Relative %: 48 %
Platelet Count: 386 10*3/uL (ref 150–400)
RBC: 4.48 MIL/uL (ref 3.87–5.11)
RDW: 15.9 % — ABNORMAL HIGH (ref 11.5–15.5)
WBC Count: 7.3 10*3/uL (ref 4.0–10.5)
nRBC: 0 % (ref 0.0–0.2)

## 2020-09-09 LAB — RETICULOCYTES
Immature Retic Fract: 4.7 % (ref 2.3–15.9)
RBC.: 4.35 MIL/uL (ref 3.87–5.11)
Retic Count, Absolute: 46.1 10*3/uL (ref 19.0–186.0)
Retic Ct Pct: 1.1 % (ref 0.4–3.1)

## 2020-09-09 NOTE — Progress Notes (Signed)
Hematology and Oncology Follow Up Visit  Sara Nelson [id number] 13-Feb-1976 45 y.o. 09/09/2020   Principle Diagnosis:  Iron deficiency anemia secondary to heavy, irregular cycles   Current Therapy:   IV iron as indicated    Interim History:  Sara Nelson is here today for follow-up. She is doing well but does have fatigue at times.  She is on her cycle now and states that since getting the IV iron her last 2 cycles have come earlier than usual and she had more cramping.  No other blood loss noted. No bruising or petechiae.  No fever, chills, n/v, cough, rash, dizziness, SOB, chest pain, palpitations, abdominal pain or changes in bowel.  No swelling, tenderness, numbness or tingling in her extremities at this time.  No falls or syncope.  She has maintained a good appetite and is staying well hydrated. Her weight is stable.   ECOG Performance Status: 1 - Symptomatic but completely ambulatory  Medications:  Allergies as of 09/09/2020      Reactions   Ceftin [cefuroxime Axetil] Shortness Of Breath   Latex Rash, Other (See Comments)   Redish skin      Medication List       Accurate as of September 09, 2020  3:15 PM. If you have any questions, ask your nurse or doctor.        traZODone 50 MG tablet Commonly known as: DESYREL TAKE 1 TO 2 TABLETS BY MOUTH FOR SLEEP AS NEEDED       Allergies:  Allergies  Allergen Reactions  . Ceftin [Cefuroxime Axetil] Shortness Of Breath  . Latex Rash and Other (See Comments)    Redish skin    Past Medical History, Surgical history, Social history, and Family History were reviewed and updated.  Review of Systems: All other 10 point review of systems is negative.   Physical Exam:  vitals were not taken for this visit.   Wt Readings from Last 3 Encounters:  07/25/20 164 lb (74.4 kg)  04/21/20 158 lb 6.4 oz (71.8 kg)  01/14/20 163 lb (73.9 kg)    Ocular: Sclerae unicteric, pupils equal, round and reactive to  light Ear-nose-throat: Oropharynx clear, dentition fair Lymphatic: No cervical or supraclavicular adenopathy Lungs no rales or rhonchi, good excursion bilaterally Heart regular rate and rhythm, no murmur appreciated Abd soft, nontender, positive bowel sounds MSK no focal spinal tenderness, no joint edema Neuro: non-focal, well-oriented, appropriate affect Breasts: Deferred   Lab Results  Component Value Date   WBC 7.3 09/09/2020   HGB 11.6 (L) 09/09/2020   HCT 36.4 09/09/2020   MCV 81.3 09/09/2020   PLT 386 09/09/2020   Lab Results  Component Value Date   FERRITIN 16 07/25/2020   IRON 30 (L) 07/25/2020   TIBC 325 07/25/2020   UIBC 296 07/25/2020   IRONPCTSAT 9 (L) 07/25/2020   Lab Results  Component Value Date   RETICCTPCT 1.1 09/09/2020   RBC 4.48 09/09/2020   RBC 4.35 09/09/2020   No results found for: KPAFRELGTCHN, LAMBDASER, KAPLAMBRATIO No results found for: IGGSERUM, IGA, IGMSERUM No results found for: Ronnald Ramp, A1GS, A2GS, Tillman Sers, SPEI   Chemistry      Component Value Date/Time   NA 137 07/25/2020 1315   NA 140 12/24/2019 1634   K 4.0 07/25/2020 1315   CL 102 07/25/2020 1315   CO2 28 07/25/2020 1315   BUN 11 07/25/2020 1315   BUN 9 12/24/2019 1634   CREATININE 0.89 07/25/2020 1315  Component Value Date/Time   CALCIUM 9.3 07/25/2020 1315   ALKPHOS 57 07/25/2020 1315   AST 14 (L) 07/25/2020 1315   ALT 8 07/25/2020 1315   BILITOT 0.4 07/25/2020 1315       Impression and Plan: Sara Nelson is a very pleasant 45 yo African American female with iron deficiency anemia.  Iron studies are pending. We will replace if needed.  Follow-up in 3 months.  She can contact our office with any questions or concerns.   Laverna Peace, NP 2/11/20223:15 PM

## 2020-09-12 ENCOUNTER — Telehealth: Payer: Self-pay | Admitting: *Deleted

## 2020-09-12 LAB — IRON AND TIBC
Iron: 51 ug/dL (ref 41–142)
Saturation Ratios: 22 % (ref 21–57)
TIBC: 236 ug/dL (ref 236–444)
UIBC: 185 ug/dL (ref 120–384)

## 2020-09-12 LAB — FERRITIN: Ferritin: 78 ng/mL (ref 11–307)

## 2020-09-12 NOTE — Telephone Encounter (Signed)
Called patient - unable to lvm - upcoming appts. In [place]

## 2020-09-23 ENCOUNTER — Other Ambulatory Visit: Payer: Self-pay

## 2020-09-23 ENCOUNTER — Ambulatory Visit (INDEPENDENT_AMBULATORY_CARE_PROVIDER_SITE_OTHER): Payer: Medicaid Other | Admitting: Obstetrics and Gynecology

## 2020-09-23 ENCOUNTER — Encounter: Payer: Self-pay | Admitting: Obstetrics and Gynecology

## 2020-09-23 VITALS — BP 108/60 | HR 68 | Resp 16 | Wt 164.0 lb

## 2020-09-23 DIAGNOSIS — N946 Dysmenorrhea, unspecified: Secondary | ICD-10-CM | POA: Diagnosis not present

## 2020-09-23 DIAGNOSIS — D259 Leiomyoma of uterus, unspecified: Secondary | ICD-10-CM | POA: Diagnosis not present

## 2020-09-23 DIAGNOSIS — N920 Excessive and frequent menstruation with regular cycle: Secondary | ICD-10-CM

## 2020-09-23 DIAGNOSIS — D500 Iron deficiency anemia secondary to blood loss (chronic): Secondary | ICD-10-CM | POA: Diagnosis not present

## 2020-09-23 MED ORDER — TRANEXAMIC ACID 650 MG PO TABS: 1300.0000 mg | ORAL_TABLET | Freq: Three times a day (TID) | ORAL | 2 refills | Status: DC

## 2020-09-23 NOTE — Progress Notes (Signed)
GYNECOLOGY  VISIT   HPI: 45 y.o.   Divorced Black or [place] American Not Hispanic or Latino  female   [phone] with Patient's last menstrual period was 09/08/2020.   here for follow up. She has a fibroid uterus with menorrhagia, anemia and severe dysmenorrhea. She hasn't tolerated OCP's, POP or the mirena IUD in the past. Not interested in [place] or hysterectomy.  She was stared on Lysteda in the fall and referred to hematology. She never took the lysteda. She has had 3 iron transfusions since last month.   Her last blood work was on 09/09/20 Ferritin 78 (up from 10), Hgb 11.6 (up from 9.6)  Ultrasound from 01/14/20: The uterus measures 14.62 x 7.97 x 6.66 cm, 16 fibroids measured the largest was just under 4 cm.2 myomas are pedunculated. Symmetrical endometrium, no masses seen. Prior ultrasound from 2019 the uterus measured 11.24 x 5.53 x 7 cm, only 4 myomas were recorded, largest 3.5 cm.   Currently her cycles are every 3-4 weeks x 5-7 days. She can saturate an overnight pad in up to 30 minutes.   Not currently sexually active.   GYNECOLOGIC HISTORY: Patient's last menstrual period was 09/08/2020. Contraception:abstaining.  Menopausal hormone therapy: none        OB History    Gravida  3   Para  2   Term  2   Preterm      AB  1   Living  2     SAB      IAB      Ectopic      Multiple      Live Births  2              Patient Active Problem List   Diagnosis Date Noted  . IDA (iron deficiency anemia) 07/26/2020  . Back spasm 08/21/2019  . Simple chronic bronchitis ([place]) 05/30/2019  . Vitamin D deficiency 12/18/2018  . Sciatica of right side 10/07/2017  . OCD (obsessive compulsive disorder) 09/23/2015  . Yeast vaginitis 03/21/2014  . Seasonal and perennial allergic rhinitis 03/21/2014  . Endometriosis of pelvic peritoneum 11/21/2012  . Acute upper respiratory infections of unspecified site 11/04/2012  . Coryza 11/04/2012  . Allergic urticaria 05/27/2012   . Insomnia 05/27/2012  . Sarcoid ([place]) 02/01/2012    Past Medical History:  Diagnosis Date  . Endometriosis   . History of COVID-19   . Hyperlipidemia   . Sarcoid     Past Surgical History:  Procedure Laterality Date  . CESAREAN SECTION    . LAPAROTOMY  2003   missing one tube and ovary? Endometriosis  . NECK SURGERY  01-24-12   removed lymph node    Current Outpatient Medications  Medication Sig Dispense Refill  . traZODone (DESYREL) 50 MG tablet TAKE 1 TO 2 TABLETS BY MOUTH FOR SLEEP AS NEEDED     No current facility-administered medications for this visit.     ALLERGIES: Ceftin [cefuroxime axetil] and Latex  Family History  Problem Relation Age of Onset  . Asthma Father        childhood-smoker  . Asthma Sister   . Asthma Brother   . Diabetes Paternal Grandmother   . Breast cancer Maternal Aunt   . Diabetes Maternal Grandmother   . Hypertension Maternal Grandmother   . Endometriosis Cousin     Social History   Socioeconomic History  . Marital status: Divorced    Spouse name: Not on file  . Number of children: 2  .  Years of education: Not on file  . Highest education level: Not on file  Occupational History  . Occupation: Art therapist  Tobacco Use  . Smoking status: Never Smoker  . Smokeless tobacco: Never Used  . Tobacco comment: patient smokes THC every night   Vaping Use  . Vaping Use: Never used  Substance and Sexual Activity  . Alcohol use: Not Currently  . Drug use: Not Currently  . Sexual activity: Yes    Partners: Male    Birth control/protection: None  Other Topics Concern  . Not on file  Social History Narrative  . Not on file   Social Determinants of Health   Financial Resource Strain: Not on file  Food Insecurity: Not on file  Transportation Needs: Not on file  Physical Activity: Not on file  Stress: Not on file  Social Connections: Not on file  Intimate Partner Violence: Not on file    Review of Systems   Constitutional: Negative.   HENT: Negative.   Eyes: Negative.   Respiratory: Negative.   Cardiovascular: Negative.   Gastrointestinal: Negative.   Genitourinary: Negative.   Musculoskeletal: Negative.   Skin: Negative.   Neurological: Negative.   Endo/Heme/Allergies: Negative.   Psychiatric/Behavioral: Negative.     PHYSICAL EXAMINATION:    BP 108/60 (BP Location: Right Arm, Patient Position: Sitting, Cuff Size: Normal)   Pulse 68   Resp 16   Wt 164 lb (74.4 kg)   LMP 09/08/2020   BMI 27.29 kg/m     General appearance: alert, cooperative and appears stated age  62. Uterine leiomyoma, unspecified location She hasn't had improvement with OCP's, POP's or the mirena IUD. She hasn't tried the lysteda Not interested in [place] or hysterectomy Discussed option of myomectomy, uterine artery embolization and sonata ultrasound ablation She is interested in sonata uterine ablation, will work on scheduling Will start lysteda for now.  2. Menorrhagia with regular cycle See above  3. Iron deficiency anemia due to chronic blood loss S/p iron transfusion x 3 since early 1/22  4. Dysmenorrhea  ~30 minutes in total patient care.

## 2020-09-26 ENCOUNTER — Other Ambulatory Visit: Payer: Self-pay | Admitting: *Deleted

## 2020-09-26 DIAGNOSIS — D259 Leiomyoma of uterus, unspecified: Secondary | ICD-10-CM

## 2020-09-26 DIAGNOSIS — N920 Excessive and frequent menstruation with regular cycle: Secondary | ICD-10-CM

## 2020-10-20 ENCOUNTER — Telehealth: Payer: Self-pay | Admitting: *Deleted

## 2020-10-20 NOTE — Telephone Encounter (Signed)
Spoke with patient.  Patient reports large amounts of yellow vaginal d/c, started on 10/17/20. Symptoms started after unprotected intercourse with spouse. Reports Hx of trichomonas.  Denies any other GYN symptoms. Denies pelvic pain, vaginal bleeding, N/V, fever/chills.  Patient is scheduled for PUS on 10/25/20.   OV scheduled for 3/25 at 10am with Dr. Talbert Nan.   Routing to provider for final review. Patient is agreeable to disposition. Will close encounter.

## 2020-10-20 NOTE — Telephone Encounter (Signed)
-----   Message from Yates Decamp, [place] sent at 10/20/2020  3:13 PM EDT ----- Regarding: FW: nurse call  ----- Message ----- From: Sinclair Grooms Sent: 10/20/2020   2:17 PM EDT To: Yates Decamp, CMA Subject: FW: nurse call                                 Patient can not come today wants to come in tomorrow. Please advise.  ----- Message ----- From: Ramond Craver, RMA Sent: 10/20/2020   2:05 PM EDT To: Sinclair Grooms Subject: FW: nurse call                                  ----- Message ----- From: Yates Decamp, CMA Sent: 10/20/2020   2:02 PM EDT To: [facility] Triage Subject: FW: nurse call                                 If this patient needs to be seen. She could be worked in at [employer] [facility] today. She is in need of a procedure. ----- Message ----- From: Sinclair Grooms Sent: 10/20/2020  12:55 PM EDT To: Yates Decamp, CMA Subject: nurse call                                     Patient left message in appointment voicemail stated she still having heavy bleeding and needs to be seen as soon as possible. She has an ultrasound appointment on Tuesday March 29.

## 2020-10-21 ENCOUNTER — Other Ambulatory Visit: Payer: Self-pay

## 2020-10-21 ENCOUNTER — Ambulatory Visit (INDEPENDENT_AMBULATORY_CARE_PROVIDER_SITE_OTHER): Payer: Medicaid Other | Admitting: Obstetrics and Gynecology

## 2020-10-21 ENCOUNTER — Encounter: Payer: Self-pay | Admitting: Obstetrics and Gynecology

## 2020-10-21 VITALS — BP 118/80 | HR 76 | Wt 166.0 lb

## 2020-10-21 DIAGNOSIS — N898 Other specified noninflammatory disorders of vagina: Secondary | ICD-10-CM | POA: Diagnosis not present

## 2020-10-21 DIAGNOSIS — N941 Unspecified dyspareunia: Secondary | ICD-10-CM

## 2020-10-21 DIAGNOSIS — Z113 Encounter for screening for infections with a predominantly sexual mode of transmission: Secondary | ICD-10-CM

## 2020-10-21 DIAGNOSIS — N760 Acute vaginitis: Secondary | ICD-10-CM

## 2020-10-21 DIAGNOSIS — B9689 Other specified bacterial agents as the cause of diseases classified elsewhere: Secondary | ICD-10-CM

## 2020-10-21 LAB — WET PREP FOR TRICH, YEAST, CLUE

## 2020-10-21 MED ORDER — METRONIDAZOLE 500 MG PO TABS: 500.0000 mg | ORAL_TABLET | Freq: Two times a day (BID) | ORAL | 0 refills | Status: DC

## 2020-10-21 NOTE — Patient Instructions (Signed)
Vaginitis  Vaginitis is a condition in which the vaginal tissue swells and becomes irritated. This condition is most often caused by a change in the normal balance of bacteria and yeast that live in the vagina. This change causes an overgrowth of certain bacteria or yeast, which causes the inflammation. There are different types of vaginitis. What are the causes? The cause of this condition depends on the type of vaginitis. It can be caused by:  Bacteria (bacterial vaginosis).  Yeast, which is a fungus (candidiasis).  A parasite (trichomoniasis vaginitis).  A virus (viral vaginitis).  Low hormone levels (atrophic vaginitis). Low hormone levels can occur during pregnancy, breastfeeding, or after menopause.  Irritants, such as bubble baths, scented tampons, and feminine sprays (allergic vaginitis). Other factors can change the normal balance of the yeast and bacteria that live in the vagina. These include:  Antibiotic medicines.  Poor hygiene.  Diaphragms, vaginal sponges, spermicides, birth control pills, and intrauterine devices (IUDs).  Sex.  Infection.  Uncontrolled diabetes.  A weakened body defense system (immune system). What increases the risk? This condition is more likely to develop in women who:  Smoke or are exposed to secondhand smoke.  Use vaginal douches, scented tampons, or scented sanitary pads.  Wear tight-fitting pants or thong underwear.  Use oral birth control pills or an IUD.  Have sex without a condom or have multiple partners.  Have an STI.  Frequently use the spermicide nonoxynol-9.  Eat lots of foods high in sugar or who have uncontrolled diabetes.  Have low estrogen levels.  Have a weakened immune system from an immune disorder or medical treatment.  Are pregnant or breastfeeding. What are the signs or symptoms? Symptoms vary depending on the cause of the vaginitis. Common symptoms include:  Abnormal vaginal discharge. ? The  discharge is white, gray, or yellow with bacterial vaginosis. ? The discharge is thick, white, and cheesy with a yeast infection. ? The discharge is frothy and yellow or greenish with trichomoniasis.  A bad vaginal smell. The smell is fishy with bacterial vaginosis.  Vaginal itching, pain, or swelling.  Pain with sex.  Pain or burning when urinating. Sometimes there are no symptoms. How is this diagnosed? This condition is diagnosed based on your symptoms and medical history. A physical exam, including a pelvic exam, will also be done. You may also have other tests, including:  Tests to determine the pH level (acidity or alkalinity) of your vagina.  A whiff test to assess the odor that results when a sample of your vaginal discharge is mixed with a potassium hydroxide solution.  Tests of vaginal fluid. A sample will be examined under a microscope. How is this treated? Treatment varies depending on the type of vaginitis you have. Your treatment may include:  Antibiotic creams or pills to treat bacterial vaginosis and trichomoniasis.  Antifungal medicines, such as vaginal creams or suppositories, to treat a yeast infection.  Medicine to ease discomfort if you have viral vaginitis. Your sexual partner should also be treated.  Estrogen delivered in a cream, pill, suppository, or vaginal ring to treat atrophic vaginitis. If vaginal dryness occurs, lubricants and moisturizing creams may help. You may need to avoid scented soaps, sprays, or douches.  Stopping use of a product that is causing allergic vaginitis and then using a vaginal cream to treat the symptoms. Follow these instructions at home: Lifestyle  Keep your genital area clean and dry. Avoid soap, and only rinse the area with water.  Do not douche  or use tampons until your health care provider says it is okay. Use sanitary pads, if needed.  Do not have sex until your health care provider approves. When you can return to sex,  practice safe sex and use condoms.  Wipe from front to back. This avoids the spread of bacteria from the rectum to the vagina. General instructions  Take over-the-counter and prescription medicines only as told by your health care provider.  If you were prescribed an antibiotic medicine, take or use it as told by your health care provider. Do not stop taking or using the antibiotic even if you start to feel better.  Keep all follow-up visits. This is important. How is this prevented?  Use mild, unscented products. Do not use things that can irritate the vagina, such as fabric softeners. Avoid the following products if they are scented: ? Feminine sprays. ? Detergents. ? Tampons. ? Feminine hygiene products. ? Soaps or bubble baths.  Let air reach your genital area. To do this: ? Wear cotton underwear to reduce moisture buildup. ? Avoid wearing underwear while you sleep. ? Avoid wearing tight pants and underwear or nylons without a cotton panel. ? Avoid wearing thong underwear.  Take off any wet clothing, such as bathing suits, as soon as possible.  Practice safe sex and use condoms. Contact a health care provider if:  You have abdominal or pelvic pain.  You have a fever or chills.  You have symptoms that last for more than 2-3 days. Get help right away if:  You have a fever and your symptoms suddenly get worse. Summary  Vaginitis is a condition in which the vaginal tissue becomes inflamed.This condition is most often caused by a change in the normal balance of bacteria and yeast that live in the vagina.  Treatment varies depending on the type of vaginitis you have.  Do not douche, use tampons, or have sex until your health care provider approves. When you can return to sex, practice safe sex and use condoms. This information is not intended to replace advice given to you by your health care provider. Make sure you discuss any questions you have with your health care  provider. Document Revised: 01/14/2020 Document Reviewed: 01/14/2020 Elsevier Patient Education  [place].

## 2020-10-21 NOTE — Progress Notes (Signed)
GYNECOLOGY  VISIT   HPI: 45 y.o.   Married Black or [place] American Not Hispanic or Latino  female   [phone] with Patient's last menstrual period was 10/02/2020.   here for vaginal discharge. She got married last weekend and became sexually active with her husband. She had entry and deep dyspareunia, didn't use lubricant. It wasn't as bad the second time they had sex.  She noticed a vaginal discharge in the last 2-4 days. Heavy yellow/green d/c, no burning, no irritation, no itching, no odor.  Would like STD testing as well.   She has had +serology for HSV 2, never had an outbreak.   GYNECOLOGIC HISTORY: Patient's last menstrual period was 10/02/2020. Contraception: none Menopausal hormone therapy: none        OB History    Gravida  3   Para  2   Term  2   Preterm      AB  1   Living  2     SAB      IAB      Ectopic      Multiple      Live Births  2              Patient Active Problem List   Diagnosis Date Noted  . IDA (iron deficiency anemia) 07/26/2020  . Back spasm 08/21/2019  . Simple chronic bronchitis ([employer]) 05/30/2019  . Vitamin D deficiency 12/18/2018  . Sciatica of right side 10/07/2017  . OCD (obsessive compulsive disorder) 09/23/2015  . Yeast vaginitis 03/21/2014  . Seasonal and perennial allergic rhinitis 03/21/2014  . Endometriosis of pelvic peritoneum 11/21/2012  . Acute upper respiratory infections of unspecified site 11/04/2012  . Coryza 11/04/2012  . Allergic urticaria 05/27/2012  . Insomnia 05/27/2012  . Sarcoid ([place]) 02/01/2012    Past Medical History:  Diagnosis Date  . Endometriosis   . History of COVID-19   . Hyperlipidemia   . Sarcoid     Past Surgical History:  Procedure Laterality Date  . CESAREAN SECTION    . LAPAROTOMY  2003   missing one tube and ovary? Endometriosis  . NECK SURGERY  01-24-12   removed lymph node    Current Outpatient Medications  Medication Sig Dispense Refill  . tranexamic acid (LYSTEDA)  650 MG TABS tablet Take 2 tablets (1,300 mg total) by mouth 3 (three) times daily. (Patient not taking: Reported on 10/21/2020) 30 tablet 2   No current facility-administered medications for this visit.     ALLERGIES: Ceftin [cefuroxime axetil] and Latex  Family History  Problem Relation Age of Onset  . Asthma Father        childhood-smoker  . Asthma Sister   . Asthma Brother   . Diabetes Paternal Grandmother   . Breast cancer Maternal Aunt   . Diabetes Maternal Grandmother   . Hypertension Maternal Grandmother   . Endometriosis Cousin     Social History   Socioeconomic History  . Marital status: Divorced    Spouse name: Not on file  . Number of children: 2  . Years of education: Not on file  . Highest education level: Not on file  Occupational History  . Occupation: Art therapist  Tobacco Use  . Smoking status: Never Smoker  . Smokeless tobacco: Never Used  . Tobacco comment: patient smokes THC every night   Vaping Use  . Vaping Use: Never used  Substance and Sexual Activity  . Alcohol use: Not Currently  . Drug use: Not Currently  .  Sexual activity: Yes    Partners: Male    Birth control/protection: None  Other Topics Concern  . Not on file  Social History Narrative  . Not on file   Social Determinants of Health   Financial Resource Strain: Not on file  Food Insecurity: Not on file  Transportation Needs: Not on file  Physical Activity: Not on file  Stress: Not on file  Social Connections: Not on file  Intimate Partner Violence: Not on file    Review of Systems  Constitutional: Negative.   HENT: Negative.   Eyes: Negative.   Respiratory: Negative.   Cardiovascular: Negative.   Gastrointestinal: Negative.   Genitourinary:       Vaginal discharge  Musculoskeletal: Negative.   Skin: Negative.   Neurological: Negative.   Endo/Heme/Allergies: Negative.   Psychiatric/Behavioral: Negative.     PHYSICAL EXAMINATION:    BP 118/80   Pulse 76   Wt  166 lb (75.3 kg)   LMP 10/02/2020   BMI 27.62 kg/m     General appearance: alert, cooperative and appears stated age   Pelvic: External genitalia:  no lesions              Urethra:  normal appearing urethra with no masses, tenderness or lesions              Bartholins and Skenes: normal                 Vagina: slightly erythematous appearing vagina with an increase in thin, watery, yellow vaginal discharge              Cervix: no cervical motion tenderness and no lesions              Bimanual Exam:  Uterus:  enlarged, retroverted, irregular              Adnexa: no mass, fullness, tenderness               Chaperone was present for exam.  1. Vaginal discharge - WET PREP FOR TRICH, YEAST, CLUE: + clue  2. Screening examination for STD (sexually transmitted disease) New partner - HIV Antibody (routine testing w rflx) - RPR - Hepatitis C antibody - SURESWAB CT/NG/T. vaginalis   3. BV (bacterial vaginosis) - metroNIDAZOLE (FLAGYL) 500 MG tablet; Take 1 tablet (500 mg total) by mouth 2 (two) times daily.  Dispense: 14 tablet; Refill: 0  4. Dyspareunia, female Discussed using a lubricant She should control rate and depth of penetration

## 2020-10-24 LAB — SURESWAB CT/NG/T. VAGINALIS
C. trachomatis RNA, TMA: NOT DETECTED
N. gonorrhoeae RNA, TMA: NOT DETECTED
Trichomonas vaginalis RNA: NOT DETECTED

## 2020-10-24 LAB — RPR: RPR Ser Ql: NONREACTIVE

## 2020-10-24 LAB — HEPATITIS C ANTIBODY
Hepatitis C Ab: NONREACTIVE
SIGNAL TO CUT-OFF: 0.01 (ref <1.00)

## 2020-10-24 LAB — HIV ANTIBODY (ROUTINE TESTING W REFLEX): HIV 1&2 Ab, 4th Generation: NONREACTIVE

## 2020-10-25 ENCOUNTER — Other Ambulatory Visit: Payer: Medicaid Other

## 2020-10-25 ENCOUNTER — Other Ambulatory Visit: Payer: Self-pay | Admitting: Obstetrics and Gynecology

## 2020-10-25 ENCOUNTER — Other Ambulatory Visit: Payer: Self-pay

## 2020-10-25 ENCOUNTER — Ambulatory Visit (INDEPENDENT_AMBULATORY_CARE_PROVIDER_SITE_OTHER): Payer: Medicaid Other | Admitting: Obstetrics and Gynecology

## 2020-10-25 ENCOUNTER — Ambulatory Visit (INDEPENDENT_AMBULATORY_CARE_PROVIDER_SITE_OTHER): Payer: Medicaid Other

## 2020-10-25 ENCOUNTER — Encounter: Payer: Self-pay | Admitting: Obstetrics and Gynecology

## 2020-10-25 VITALS — BP 122/68 | HR 88 | Ht 65.0 in | Wt 166.0 lb

## 2020-10-25 DIAGNOSIS — N920 Excessive and frequent menstruation with regular cycle: Secondary | ICD-10-CM

## 2020-10-25 DIAGNOSIS — D259 Leiomyoma of uterus, unspecified: Secondary | ICD-10-CM

## 2020-10-25 DIAGNOSIS — D500 Iron deficiency anemia secondary to blood loss (chronic): Secondary | ICD-10-CM

## 2020-10-25 NOTE — Progress Notes (Signed)
GYNECOLOGY  VISIT   HPI: 45 y.o.   Married Black or [place] American Not Hispanic or Latino  female   [phone] with Patient's last menstrual period was 10/02/2020.   here for   Ultrasound consult. The patient has a fibroid uterus with menorrhagia leading to anemia and severe dysmenorrhea. She hasn't tolerated OCP's, POP or the mirena IUD. Not interested in [place] or hysterectomy. Has a script for lysteda. Interested in the sonata ultrasound ablation.   GYNECOLOGIC HISTORY: Patient's last menstrual period was 10/02/2020. Contraception none  Menopausal hormone therapy: none         OB History    Gravida  3   Para  2   Term  2   Preterm      AB  1   Living  2     SAB      IAB      Ectopic      Multiple      Live Births  2              Patient Active Problem List   Diagnosis Date Noted  . IDA (iron deficiency anemia) 07/26/2020  . Back spasm 08/21/2019  . Simple chronic bronchitis ([place]) 05/30/2019  . Vitamin D deficiency 12/18/2018  . Sciatica of right side 10/07/2017  . OCD (obsessive compulsive disorder) 09/23/2015  . Yeast vaginitis 03/21/2014  . Seasonal and perennial allergic rhinitis 03/21/2014  . Endometriosis of pelvic peritoneum 11/21/2012  . Acute upper respiratory infections of unspecified site 11/04/2012  . Coryza 11/04/2012  . Allergic urticaria 05/27/2012  . Insomnia 05/27/2012  . Sarcoid ([place]) 02/01/2012    Past Medical History:  Diagnosis Date  . Endometriosis   . History of COVID-19   . Hyperlipidemia   . Sarcoid     Past Surgical History:  Procedure Laterality Date  . CESAREAN SECTION    . LAPAROTOMY  2003   missing one tube and ovary? Endometriosis  . NECK SURGERY  01-24-12   removed lymph node    Current Outpatient Medications  Medication Sig Dispense Refill  . metroNIDAZOLE (FLAGYL) 500 MG tablet Take 1 tablet (500 mg total) by mouth 2 (two) times daily. 14 tablet 0  . tranexamic acid (LYSTEDA) 650 MG TABS tablet Take 2  tablets (1,300 mg total) by mouth 3 (three) times daily. (Patient not taking: Reported on 10/21/2020) 30 tablet 2   No current facility-administered medications for this visit.     ALLERGIES: Ceftin [cefuroxime axetil] and Latex  Family History  Problem Relation Age of Onset  . Asthma Father        childhood-smoker  . Asthma Sister   . Asthma Brother   . Diabetes Paternal Grandmother   . Breast cancer Maternal Aunt   . Diabetes Maternal Grandmother   . Hypertension Maternal Grandmother   . Endometriosis Cousin     Social History   Socioeconomic History  . Marital status: Married    Spouse name: Not on file  . Number of children: 2  . Years of education: Not on file  . Highest education level: Not on file  Occupational History  . Occupation: Art therapist  Tobacco Use  . Smoking status: Never Smoker  . Smokeless tobacco: Never Used  . Tobacco comment: patient smokes THC every night   Vaping Use  . Vaping Use: Never used  Substance and Sexual Activity  . Alcohol use: Not Currently  . Drug use: Not Currently  . Sexual activity: Yes  Partners: Male    Birth control/protection: None  Other Topics Concern  . Not on file  Social History Narrative  . Not on file   Social Determinants of Health   Financial Resource Strain: Not on file  Food Insecurity: Not on file  Transportation Needs: Not on file  Physical Activity: Not on file  Stress: Not on file  Social Connections: Not on file  Intimate Partner Violence: Not on file    Review of Systems  All other systems reviewed and are negative.   PHYSICAL EXAMINATION:    BP 122/68   Pulse 88   Wt 166 lb (75.3 kg)   LMP 10/02/2020   SpO2 98%   BMI 27.62 kg/m     General appearance: alert, cooperative and appears stated age  Pelvic ultrasound  Indications: menorrhagia, severe dysmenorrhea, fibroid uterus  Findings:  Uterus 14.09 x 8.58 x 8.51 cm  Multiple intramural and subserosal fibroids (largest  ones measured)   1) 3.64 x 3.88 cm   2) 3.28 x 2.34 cm   3) 3.34 x 3.28 cm   4) 3.22 x 2.78 cm     5) 3.08 x 2.3 cm   6) 2.83 x 2.37 cm   7) 3.98 x 3.21 cm    8) 2.79 x 2.45 cm  Endometrium 6.44 mm  Left ovary surgically absent  Right ovary 3.4 x 2.42 x 1.91 cm  No free fluid   Impression:  Enlarged retroverted fibroid uterus Multiple myomas, largest 4 cm Thin symmetrical endometrium Left ovary absent Right ovary is normal.   Ultrasound from 01/14/20: The uterus measures 14.62 x 7.97 x 6.66 cm, 16 fibroids measured the largest was just under 4 cm.2 myomas are pedunculated. Symmetrical endometrium, no masses seen. Prior ultrasound from 2019 the uterus measured 11.24 x 5.53 x 7 cm, only 4 myomas were recorded, largest 3.5 cm.  1. Menorrhagia with regular cycle Worsening, now leading to anemia    2. Iron deficiency anemia due to chronic blood loss Getting iron transfusions  3. Uterine leiomyoma, unspecified location Enlarging over time.  She hasn't tolerated OCP's, POP or the mirena IUD. Not interested in [place] or hysterectomy. Has a script for lysteda. Interested in the sonata ultrasound ablation Will schedule the Sonata procedure, discussed recovery She will need to return for a pre op   After the patient left the office further review of the patients records. Initially when she was c/o menorrhagia she wasn't anemic so no endometrial biopsy was done. Menorrhagia was felt to be secondary to her fibroids. Will have her return for an endometrial biopsy prior to the [facility] procedure.

## 2020-10-26 ENCOUNTER — Encounter: Payer: Self-pay | Admitting: Obstetrics and Gynecology

## 2020-10-27 ENCOUNTER — Telehealth: Payer: Self-pay | Admitting: *Deleted

## 2020-10-27 DIAGNOSIS — N920 Excessive and frequent menstruation with regular cycle: Secondary | ICD-10-CM

## 2020-10-27 DIAGNOSIS — D500 Iron deficiency anemia secondary to blood loss (chronic): Secondary | ICD-10-CM

## 2020-10-27 NOTE — Telephone Encounter (Signed)
Spoke with patient. Advised per Dr. Talbert Nan.  Patient is agreeable to proceed with EMB, request to schedule only on a Friday.  Patient is at work and can't talk in detail, request MyChart message with appt date and time.  Advised patient PA has been submitted for Sonata procedure, once response received can proceed with scheduling. Patient verbalizes understanding and is agreeable.   Order placed for EMB.   MyChart message to patient. EMB scheduled for 5/13 at 10am.   Routing to provider for final review. Patient is agreeable to disposition. Will close encounter.  Cc: Hayley Carder

## 2020-10-27 NOTE — Telephone Encounter (Signed)
-----   Message from Salvadore Dom, MD sent at 10/26/2020  1:36 PM EDT ----- Warden Fillers,   Initially when she was c/o menorrhagia she wasn't anemic so no endometrial biopsy was done. Menorrhagia was felt to be secondary to her fibroids. Now she is anemic, so I thinks she should have a biopsy prior to the [facility] procedure. Can you please let her know and schedule her. I realized this after she left the office yesterday.  Thanks, Sharee Pimple

## 2020-12-07 ENCOUNTER — Inpatient Hospital Stay: Payer: Medicaid Other | Admitting: Family

## 2020-12-07 ENCOUNTER — Inpatient Hospital Stay: Payer: Medicaid Other | Attending: Family

## 2020-12-08 ENCOUNTER — Telehealth: Payer: Self-pay | Admitting: *Deleted

## 2020-12-08 NOTE — Telephone Encounter (Signed)
Per schedule message Sara Nelson - called and lvm of upcoming appointment - mailed calendar

## 2020-12-09 ENCOUNTER — Ambulatory Visit: Payer: Self-pay | Admitting: Obstetrics and Gynecology

## 2020-12-29 ENCOUNTER — Other Ambulatory Visit: Payer: Self-pay

## 2020-12-29 ENCOUNTER — Encounter: Payer: Self-pay | Admitting: Nurse Practitioner

## 2020-12-29 ENCOUNTER — Other Ambulatory Visit (HOSPITAL_COMMUNITY)
Admission: RE | Admit: 2020-12-29 | Discharge: 2020-12-29 | Disposition: A | Discharge status: Home / Self Care | Payer: Medicaid Other | Source: Ambulatory Visit | Attending: Obstetrics and Gynecology | Admitting: Obstetrics and Gynecology

## 2020-12-29 ENCOUNTER — Ambulatory Visit: Payer: Medicaid Other | Admitting: Obstetrics and Gynecology

## 2020-12-29 ENCOUNTER — Ambulatory Visit (INDEPENDENT_AMBULATORY_CARE_PROVIDER_SITE_OTHER): Payer: Medicaid Other | Admitting: Nurse Practitioner

## 2020-12-29 VITALS — BP 114/70 | Ht 64.5 in | Wt 166.0 lb

## 2020-12-29 DIAGNOSIS — Z01419 Encounter for gynecological examination (general) (routine) without abnormal findings: Secondary | ICD-10-CM | POA: Insufficient documentation

## 2020-12-29 DIAGNOSIS — Z130 Encounter for screening for diseases of the blood and blood-forming organs and certain disorders involving the immune mechanism: Secondary | ICD-10-CM

## 2020-12-29 DIAGNOSIS — Z113 Encounter for screening for infections with a predominantly sexual mode of transmission: Secondary | ICD-10-CM

## 2020-12-29 DIAGNOSIS — Z13220 Encounter for screening for lipoid disorders: Secondary | ICD-10-CM

## 2020-12-29 DIAGNOSIS — Z1329 Encounter for screening for other suspected endocrine disorder: Secondary | ICD-10-CM | POA: Diagnosis not present

## 2020-12-29 DIAGNOSIS — D259 Leiomyoma of uterus, unspecified: Secondary | ICD-10-CM

## 2020-12-29 DIAGNOSIS — Z13228 Encounter for screening for other metabolic disorders: Secondary | ICD-10-CM

## 2020-12-29 NOTE — Patient Instructions (Addendum)
Health Maintenance, Female Adopting a healthy lifestyle and getting preventive care are important in promoting health and wellness. Ask your health care provider about:  The right schedule for you to have regular tests and exams.  Things you can do on your own to prevent diseases and keep yourself healthy. What should I know about diet, weight, and exercise? Eat a healthy diet  Eat a diet that includes plenty of vegetables, fruits, low-fat dairy products, and lean protein.  Do not eat a lot of foods that are high in solid fats, added sugars, or sodium.   Maintain a healthy weight Body mass index (BMI) is used to identify weight problems. It estimates body fat based on height and weight. Your health care provider can help determine your BMI and help you achieve or maintain a healthy weightlipid. Get regular exercise Get regular exercise. This is one of the most important things you can do for your health. Most adults should:  Exercise for at least 150 minutes each week. The exercise should increase your heart rate and make you sweat (moderate-intensity exercise).  Do strengthening exercises at least twice a week. This is in addition to the moderate-intensity exercise.  Spend less time sitting. Even light physical activity can be beneficial. Watch cholesterol and blood lipids Have your blood tested for lipids and cholesterol at 44 years of age, then have this test every 5 years. Have your cholesterol levels checked more often if:  Your lipid or cholesterol levels are high.  You are older than 44 years of age.  You are at high risk for heart disease. What should I know about cancer screening? Depending on your health history and family history, you may need to have cancer screening at various ages. This may include screening for:  Breast cancer.  Cervical cancer.  Colorectal cancer.  Skin cancer.  Lung cancer. What should I know about heart disease, diabetes, and high blood  pressure? Blood pressure and heart disease  High blood pressure causes heart disease and increases the risk of stroke. This is more likely to develop in people who have high blood pressure readings, are of African descent, or are overweight.  Have your blood pressure checked: ? Every 3-5 years if you are 96-13 years of age. ? Every year if you are 47 years old or older. Diabetes Have regular diabetes screenings. This checks your fasting blood sugar level. Have the screening done:  Once every three years after age 49 if you are at a normal weight and have a low risk for diabetes.  More often and at a younger age if you are overweight or have a high risk for diabetes. What should I know about preventing infection? Hepatitis B If you have a higher risk for hepatitis B, you should be screened for this virus. Talk with your health care provider to find out if you are at risk for hepatitis B infection. Hepatitis C Testing is recommended for:  Everyone born from [birth year] through 1964.  Anyone with known risk factors for hepatitis C. Sexually transmitted infections (STIs)  Get screened for STIs, including gonorrhea and chlamydia, if: ? You are sexually active and are younger than 44 years of age. ? You are older than 44 years of age and your health care provider tells you that you are at risk for this type of infection. ? Your sexual activity has changed since you were last screened, and you are at increased risk for chlamydia or gonorrhea. Ask your health care provider  if you are at risk.  Ask your health care provider about whether you are at high risk for HIV. Your health care provider may recommend a prescription medicine to help prevent HIV infection. If you choose to take medicine to prevent HIV, you should first get tested for HIV. You should then be tested every 3 months for as long as you are taking the medicine. Pregnancy  If you are about to stop having your period (premenopausal) and  you may become pregnant, seek counseling before you get pregnant.  Take 400 to 800 micrograms (mcg) of folic acid every day if you become pregnant.  Ask for birth control (contraception) if you want to prevent pregnancy. Osteoporosis and menopause Osteoporosis is a disease in which the bones lose minerals and strength with aging. This can result in bone fractures. If you are 65 years old or older, or if you are at risk for osteoporosis and fractures, ask your health care provider if you should:  Be screened for bone loss.  Take a calcium or vitamin D supplement to lower your risk of fractures.  Be given hormone replacement therapy (HRT) to treat symptoms of menopause. Follow these instructions at home: Lifestyle  Do not use any products that contain nicotine or tobacco, such as cigarettes, e-cigarettes, and chewing tobacco. If you need help quitting, ask your health care provider.  Do not use street drugs.  Do not share needles.  Ask your health care provider for help if you need support or information about quitting drugs. Alcohol use  Do not drink alcohol if: ? Your health care provider tells you not to drink. ? You are pregnant, may be pregnant, or are planning to become pregnant.  If you drink alcohol: ? Limit how much you use to 0-1 drink a day. ? Limit intake if you are breastfeeding.  Be aware of how much alcohol is in your drink. In the [place] one drink equals one 12 oz bottle of beer (355 mL), one 5 oz glass of wine (148 mL), or one 1 oz glass of hard liquor (44 mL). General instructions  Schedule regular health, dental, and eye exams.  Stay current with your vaccines.  Tell your health care provider if: ? You often feel depressed. ? You have ever been abused or do not feel safe at home. Summary  Adopting a healthy lifestyle and getting preventive care are important in promoting health and wellness.  Follow your health care provider's instructions about healthy  diet, exercising, and getting tested or screened for diseases.  Follow your health care provider's instructions on monitoring your cholesterol and blood pressure. This information is not intended to replace advice given to you by your health care provider. Make sure you discuss any questions you have with your health care provider. Document Revised: 07/09/2018 Document Reviewed: 07/09/2018 Elsevier Patient Education  2021 [employer]  

## 2020-12-29 NOTE — Progress Notes (Signed)
45 y.o. [id number] Married [place] American female here for annual exam.    Was married in March and Divorced in May. Husband too rough. Wants STD testing Has been working with Dr. Talbert Nan with Fibroids. Sometimes feels pain but mostly doing okay. Has had 3 iron infusions. Has discussed many options, but at this time would not like to do anything. For religious reasons, she does not want to stop her period and does not want to remove anything she was given at birth.  Period Cycle (Days): 25 Period Duration (Days): 7 Period Pattern: Regular Menstrual Flow: Heavy Dysmenorrhea: (!) Severe Dysmenorrhea Symptoms: Cramping Patient's last menstrual period was 12/19/2020.            Sexually active: No.  The current method of family planning is none.    Exercising: No. Smoker:  no  Health Maintenance: Pap:  10-28-2017 neg HPV HR neg History of abnormal Pap:  yes NHA:FBXUX Colonoscopy:  none BMD:   2012 Gardasil:  no Covid-19: no Hep C testing: neg 2022 Screening Labs: to be done here   reports that she has never smoked. She has never used smokeless tobacco. She reports previous alcohol use. She reports previous drug use.  Past Medical History:  Diagnosis Date  . Endometriosis   . Fibroid   . History of COVID-19   . Hyperlipidemia   . Sarcoid     Past Surgical History:  Procedure Laterality Date  . CESAREAN SECTION    . LAPAROTOMY  2003   missing one tube and ovary? Endometriosis  . NECK SURGERY  01-24-12   removed lymph node    No current outpatient medications on file.   No current facility-administered medications for this visit.    Family History  Problem Relation Age of Onset  . Asthma Father        childhood-smoker  . Asthma Sister   . Asthma Brother   . Diabetes Paternal Grandmother   . Breast cancer Maternal Aunt   . Diabetes Maternal Grandmother   . Hypertension Maternal Grandmother   . Endometriosis Cousin     Review of Systems  Exam:   BP  114/70   Ht 5' 4.5" (1.638 m)   Wt 166 lb (75.3 kg)   LMP 12/19/2020   BMI 28.05 kg/m   Height: 5' 4.5" (163.8 cm)  General appearance: alert, cooperative and appears stated age, no acute distress Head: Normocephalic, without obvious abnormality Neck: no adenopathy, thyroid normal to inspection and palpation Lungs: clear to auscultation bilaterally Breasts: normal appearance, no masses or tenderness Heart: regular rate and rhythm Abdomen: soft, non-tender; no masses,  no organomegaly Extremities: extremities normal, no edema Skin: No rashes or lesions Lymph nodes: Cervical, supraclavicular, and axillary nodes normal. No abnormal inguinal nodes palpated Neurologic: Grossly normal   Pelvic: External genitalia:  no lesions              Urethra:  normal appearing urethra with no masses, tenderness or lesions              Bartholins and Skenes: normal                 Vagina: normal appearing vagina, appropriate for age, normal appearing discharge, no lesions              Cervix: neg cervical motion tenderness, no visible lesions             Bimanual Exam:   Uterus:  enlarged, 16 weeks size, irregular  and firm              Adnexa: no mass, fullness, tenderness and difficult to assess r/t size of uterus                 Joy, CMA Chaperone was present for exam.  A:  Well woman exam -  Plan: Cytology - PAP( [facility])-discussed screening interval recommendations, pt still would like today  Lab work screening per request Lipid screening - Plan: Lipid Profile Screening for deficiency anemia - Plan: CBC Screening for endocrine, metabolic and immunity disorder - Plan: Comp Met (CMET)   Screening examination for STD (sexually transmitted disease) - Plan: HIV antibody (with reflex), RPR, Cytology - PAP( [facility])   Uterine leiomyoma, unspecified location- for religious reasons pt does not want uterus removed and does not want to stop periods. Will continue to monitor symptoms and work  with Dr. Talbert Nan for management    P:   Pap :collected today  Mammogram: Discussed, pt will schedule, written information provided  Labs: as above  Medications: no new today

## 2020-12-30 LAB — CBC
HCT: 38.7 % (ref 35.0–45.0)
Hemoglobin: 11.8 g/dL (ref 11.7–15.5)
MCH: 26.1 pg — ABNORMAL LOW (ref 27.0–33.0)
MCHC: 30.5 g/dL — ABNORMAL LOW (ref 32.0–36.0)
MCV: 85.6 fL (ref 80.0–100.0)
MPV: 9.3 fL (ref 7.5–12.5)
Platelets: 488 10*3/uL — ABNORMAL HIGH (ref 140–400)
RBC: 4.52 10*6/uL (ref 3.80–5.10)
RDW: 13.1 % (ref 11.0–15.0)
WBC: 6.7 10*3/uL (ref 3.8–10.8)

## 2020-12-30 LAB — HIV ANTIBODY (ROUTINE TESTING W REFLEX): HIV 1&2 Ab, 4th Generation: NONREACTIVE

## 2020-12-30 LAB — LIPID PANEL
Cholesterol: 194 mg/dL (ref <200)
HDL: 64 mg/dL (ref >=50)
LDL Cholesterol (Calc): 115 mg/dL (calc) — ABNORMAL HIGH
Non-HDL Cholesterol (Calc): 130 mg/dL (calc) — ABNORMAL HIGH (ref <130)
Total CHOL/HDL Ratio: 3 (calc) (ref <5.0)
Triglycerides: 61 mg/dL (ref <150)

## 2020-12-30 LAB — COMPREHENSIVE METABOLIC PANEL
AG Ratio: 1.4 (calc) (ref 1.0–2.5)
ALT: 9 U/L (ref 6–29)
AST: 14 U/L (ref 10–30)
Albumin: 4.1 g/dL (ref 3.6–5.1)
Alkaline phosphatase (APISO): 55 U/L (ref 31–125)
BUN: 13 mg/dL (ref 7–25)
CO2: 23 mmol/L (ref 20–32)
Calcium: 9 mg/dL (ref 8.6–10.2)
Chloride: 105 mmol/L (ref 98–110)
Creat: 0.87 mg/dL (ref 0.50–1.10)
Globulin: 2.9 g/dL (calc) (ref 1.9–3.7)
Glucose, Bld: 90 mg/dL (ref 65–99)
Potassium: 4.1 mmol/L (ref 3.5–5.3)
Sodium: 137 mmol/L (ref 135–146)
Total Bilirubin: 0.7 mg/dL (ref 0.2–1.2)
Total Protein: 7 g/dL (ref 6.1–8.1)

## 2020-12-30 LAB — RPR: RPR Ser Ql: NONREACTIVE

## 2021-01-02 LAB — CYTOLOGY - PAP
Chlamydia: NEGATIVE
Comment: NEGATIVE
Comment: NEGATIVE
Comment: NORMAL
Diagnosis: UNDETERMINED — AB
High risk HPV: NEGATIVE
Neisseria Gonorrhea: NEGATIVE

## 2021-01-03 ENCOUNTER — Ambulatory Visit (HOSPITAL_COMMUNITY)
Admission: EM | Admit: 2021-01-03 | Discharge: 2021-01-03 | Disposition: A | Discharge status: Home / Self Care | Payer: Medicaid Other

## 2021-01-03 ENCOUNTER — Other Ambulatory Visit: Payer: Self-pay

## 2021-01-03 ENCOUNTER — Inpatient Hospital Stay: Payer: Medicaid Other | Attending: Family

## 2021-01-03 ENCOUNTER — Encounter (HOSPITAL_COMMUNITY): Payer: Self-pay

## 2021-01-03 ENCOUNTER — Inpatient Hospital Stay: Payer: Medicaid Other | Admitting: Family

## 2021-01-03 DIAGNOSIS — B354 Tinea corporis: Secondary | ICD-10-CM

## 2021-01-03 HISTORY — DX: Iron deficiency anemia, unspecified: D50.9

## 2021-01-03 MED ORDER — CLOTRIMAZOLE-BETAMETHASONE 1-0.05 % EX CREA: TOPICAL_CREAM | CUTANEOUS | 0 refills | Status: DC

## 2021-01-03 NOTE — ED Triage Notes (Signed)
Pt noticed rash on back of neck that started as a bump a week ago. Pt then noticed a circle around bump, which then went away with some desitin.   2 days ago, area began to become itchy and now several itchy bumps are noted to back of neck.

## 2021-01-03 NOTE — ED Provider Notes (Signed)
[facility]   [id number] 01/03/21 Arrival Time: 1904  CC: RASH  SUBJECTIVE:  Sara Nelson is a 45 y.o. female who presents with a skin complaint that began 2 days.  Denies precipitating event or trauma.  Denies changes in soaps, detergents, close contacts with similar rash, known trigger or environmental trigger, allergy. Denies medications change or starting a new medication recently.  Localizes the rash to back of neck.  Describes it as itchy and denies any pain.  Has tried Desitin cream without relief.  There are no aggravating or alleviating factors.  Denies fever, chills, nausea, vomiting, erythema, swelling, discharge, oral lesions, SOB, chest pain, abdominal pain, changes in bowel or bladder function.    ROS: As per HPI.  All other pertinent ROS negative.     Past Medical History:  Diagnosis Date  . Endometriosis   . Fibroid   . History of COVID-19   . Hyperlipidemia   . Iron deficiency anemia    per pt  . Sarcoid    Past Surgical History:  Procedure Laterality Date  . CESAREAN SECTION    . LAPAROTOMY  2003   missing one tube and ovary? Endometriosis  . NECK SURGERY  01-24-12   removed lymph node   Allergies  Allergen Reactions  . Ceftin [Cefuroxime Axetil] Shortness Of Breath  . Latex Rash and Other (See Comments)    Redish skin   No current facility-administered medications on file prior to encounter.   Current Outpatient Medications on File Prior to Encounter  Medication Sig Dispense Refill  . Ascorbic Acid (VITAMIN C PO) Take by mouth.    . Ferrous Sulfate (IRON) 325 (65 Fe) MG TABS Take 2 tablets by mouth daily.    Marland Kitchen UNABLE TO FIND Med Name: pt reports taking black seed oil once per day    . VITAMIN D, CHOLECALCIFEROL, PO Take by mouth.     Social History   Socioeconomic History  . Marital status: Married    Spouse name: Not on file  . Number of children: 2  . Years of education: Not on file  . Highest education level: Not on file   Occupational History  . Occupation: Art therapist  Tobacco Use  . Smoking status: Never Smoker  . Smokeless tobacco: Never Used  Vaping Use  . Vaping Use: Never used  Substance and Sexual Activity  . Alcohol use: Not Currently  . Drug use: Not Currently  . Sexual activity: Not Currently    Partners: Male  Other Topics Concern  . Not on file  Social History Narrative  . Not on file   Social Determinants of Health   Financial Resource Strain: Not on file  Food Insecurity: Not on file  Transportation Needs: Not on file  Physical Activity: Not on file  Stress: Not on file  Social Connections: Not on file  Intimate Partner Violence: Not on file   Family History  Problem Relation Age of Onset  . Asthma Father        childhood-smoker  . Asthma Sister   . Asthma Brother   . Diabetes Paternal Grandmother   . Breast cancer Maternal Aunt   . Diabetes Maternal Grandmother   . Hypertension Maternal Grandmother   . Endometriosis Cousin     OBJECTIVE: Vitals:   01/03/21 1951  BP: 99/72  Pulse: 79  Resp: 18  Temp: 98.4 F (36.9 C)  SpO2: 100%    General appearance: alert; no distress Head: NCAT Lungs: clear to auscultation  bilaterally Heart: regular rate and rhythm.  Radial pulse 2+ bilaterally Extremities: no edema Skin: warm and dry; (see picture below)    Psychological: alert and cooperative; normal mood and affect  ASSESSMENT & PLAN:  1. Tinea corporis     Meds ordered this encounter  Medications  . clotrimazole-betamethasone (LOTRISONE) cream    Sig: Apply to affected area 2 times daily    Dispense:  15 g    Refill:  0    Order Specific Question:   Supervising Provider    Answer:   Chase Picket [id number]  . clotrimazole-betamethasone (LOTRISONE) cream    Sig: Apply to affected area 2 times daily.    Dispense:  15 g    Refill:  0    Order Specific Question:   Supervising Provider    Answer:   Chase Picket [[id number]]     Clotrimazole-betamethasone (antifungal) twice a day as needed  Avoid hot showers/ baths Moisturize skin daily  Follow up with PCP if symptoms persists Return or go to the ER if you have any new or worsening symptoms such as fever, chills, nausea, vomiting, redness, swelling, discharge, if symptoms do not improve with medications  Reviewed expectations re: course of current medical issues. Questions answered. Outlined signs and symptoms indicating need for more acute intervention. Patient verbalized understanding. After Visit Summary given.   Pearson Forster, NP 01/03/21 2014

## 2021-01-03 NOTE — Discharge Instructions (Addendum)
Apply the Lotrisone cream twice a day until rash clears up.    Try to keep the area clean and dry.     Return or go to the Emergency Department if symptoms worsen or do not improve in the next few days.

## 2021-02-01 ENCOUNTER — Other Ambulatory Visit: Payer: Self-pay

## 2021-02-01 ENCOUNTER — Ambulatory Visit (INDEPENDENT_AMBULATORY_CARE_PROVIDER_SITE_OTHER): Payer: Medicaid Other | Admitting: Obstetrics and Gynecology

## 2021-02-01 ENCOUNTER — Encounter: Payer: Self-pay | Admitting: Obstetrics and Gynecology

## 2021-02-01 VITALS — BP 110/66 | HR 67 | Ht 65.0 in | Wt 166.0 lb

## 2021-02-01 DIAGNOSIS — Z862 Personal history of diseases of the blood and blood-forming organs and certain disorders involving the immune mechanism: Secondary | ICD-10-CM | POA: Diagnosis not present

## 2021-02-01 DIAGNOSIS — Z8742 Personal history of other diseases of the female genital tract: Secondary | ICD-10-CM

## 2021-02-01 DIAGNOSIS — D259 Leiomyoma of uterus, unspecified: Secondary | ICD-10-CM | POA: Diagnosis not present

## 2021-02-01 NOTE — Progress Notes (Signed)
GYNECOLOGY  VISIT   HPI: 45 y.o.   Married [place] or [place] American Not Hispanic or Latino  female   [phone] with No LMP recorded.   here for endometrial biopsy.  She has a h/o a fibroid uterus, severe dysmenorrhea and menorrhagia leading to anemia.   H/O anemia, hgb 9.6 in 9/21, up to 11.8 last month. Iron transfusions in 2/22, currently on 2 iron tablets a day.  Her last 2 cycles have been normal.  Cycles are ~ q 3-4 weeks. For the last few cycles her bleeding has only lasted x 6 days (down from 9 days). Currently not saturating her pad, just changing it every several hours when she goes to the bathroom. Earlier this year she was saturating an overnight pad in 30 minutes. Cramps typically are bad, recently not having cramps.  Several months ago when her cycle was still heavy she tried the lysteda, but decided she didn't really want to take it so stopped. Didn't take it long enough to see if it helped.  She was planning on the [place] fibroid ablation, but now her symptoms have improved.    GYNECOLOGIC HISTORY: No LMP recorded. Contraception:none  Menopausal hormone therapy: none         OB History     Gravida  3   Para  2   Term  2   Preterm      AB  1   Living  2      SAB      IAB      Ectopic      Multiple      Live Births  2              Patient Active Problem List   Diagnosis Date Noted   IDA (iron deficiency anemia) 07/26/2020   Back spasm 08/21/2019   Simple chronic bronchitis ([place]) 05/30/2019   Vitamin D deficiency 12/18/2018   Sciatica of right side 10/07/2017   OCD (obsessive compulsive disorder) 09/23/2015   Yeast vaginitis 03/21/2014   Seasonal and perennial allergic rhinitis 03/21/2014   Endometriosis of pelvic peritoneum 11/21/2012   Acute upper respiratory infections of unspecified site 11/04/2012   Coryza 11/04/2012   Allergic urticaria 05/27/2012   Insomnia 05/27/2012   Sarcoid ([place]) 02/01/2012    Past Medical History:   Diagnosis Date   Endometriosis    Fibroid    History of COVID-19    Hyperlipidemia    Iron deficiency anemia    per pt   Sarcoid     Past Surgical History:  Procedure Laterality Date   CESAREAN SECTION     LAPAROTOMY  2003   missing one tube and ovary? Endometriosis   NECK SURGERY  01-24-12   removed lymph node    Current Outpatient Medications  Medication Sig Dispense Refill   Ascorbic Acid (VITAMIN C PO) Take by mouth.     clotrimazole-betamethasone (LOTRISONE) cream Apply to affected area 2 times daily 15 g 0   clotrimazole-betamethasone (LOTRISONE) cream Apply to affected area 2 times daily. 15 g 0   Ferrous Sulfate (IRON) 325 (65 Fe) MG TABS Take 2 tablets by mouth daily.     UNABLE TO FIND Med Name: pt reports taking black seed oil once per day     VITAMIN D, CHOLECALCIFEROL, PO Take by mouth.     No current facility-administered medications for this visit.     ALLERGIES: Ceftin [cefuroxime axetil] and Latex  Family History  Problem Relation Age of  Onset   Asthma Father        childhood-smoker   Asthma Sister    Asthma Brother    Diabetes Paternal Grandmother    Breast cancer Maternal Aunt    Diabetes Maternal Grandmother    Hypertension Maternal Grandmother    Endometriosis Cousin     Social History   Socioeconomic History   Marital status: Married    Spouse name: Not on file   Number of children: 2   Years of education: Not on file   Highest education level: Not on file  Occupational History   Occupation: Art therapist  Tobacco Use   Smoking status: Never   Smokeless tobacco: Never  Vaping Use   Vaping Use: Never used  Substance and Sexual Activity   Alcohol use: Not Currently   Drug use: Not Currently   Sexual activity: Not Currently    Partners: Male  Other Topics Concern   Not on file  Social History Narrative   Not on file   Social Determinants of Health   Financial Resource Strain: Not on file  Food Insecurity: Not on file   Transportation Needs: Not on file  Physical Activity: Not on file  Stress: Not on file  Social Connections: Not on file  Intimate Partner Violence: Not on file    ROS  PHYSICAL EXAMINATION:    There were no vitals taken for this visit.    General appearance: alert, cooperative and appears stated age  74. History of menorrhagia She hasn't tolerated OCP's, POP or the mirena IUD in the past. Didn't want lysteda, not interested in [place], uterine artery embolization or hysterectomy. She was interested in [place] fibroid ablation, but with the recent normalization in her cycles will just calendar her cycles.  Will not perform endometrial biopsy today since her last 2 cycles have been normal and her anemia has resolved. She will calendar her cycles over the next 3 months F/U in 3 months.   2. History of anemia Currently on 2 iron tablets a day. Reviewed information from UTD about just taking one iron tablet a day (no longer recommending multiple tablets or dosing more than one time a day)  3. Uterine leiomyoma, unspecified location See above.   Over 30 minutes in total patient care. Mostly in counseling

## 2021-03-11 ENCOUNTER — Other Ambulatory Visit: Payer: Self-pay

## 2021-03-11 ENCOUNTER — Encounter (HOSPITAL_COMMUNITY): Payer: Self-pay | Admitting: Emergency Medicine

## 2021-03-11 ENCOUNTER — Ambulatory Visit (HOSPITAL_COMMUNITY)
Admission: EM | Admit: 2021-03-11 | Discharge: 2021-03-11 | Disposition: A | Discharge status: Home / Self Care | Payer: Medicaid Other

## 2021-03-11 DIAGNOSIS — J0100 Acute maxillary sinusitis, unspecified: Secondary | ICD-10-CM

## 2021-03-11 DIAGNOSIS — H00015 Hordeolum externum left lower eyelid: Secondary | ICD-10-CM | POA: Diagnosis not present

## 2021-03-11 DIAGNOSIS — J302 Other seasonal allergic rhinitis: Secondary | ICD-10-CM | POA: Diagnosis not present

## 2021-03-11 DIAGNOSIS — M6740 Ganglion, unspecified site: Secondary | ICD-10-CM

## 2021-03-11 MED ORDER — LEVOCETIRIZINE DIHYDROCHLORIDE 5 MG PO TABS: 5.0000 mg | ORAL_TABLET | Freq: Every evening | ORAL | 2 refills | Status: DC

## 2021-03-11 MED ORDER — DOXYCYCLINE HYCLATE 100 MG PO CAPS: 100.0000 mg | ORAL_CAPSULE | Freq: Two times a day (BID) | ORAL | 0 refills | Status: DC

## 2021-03-11 NOTE — ED Triage Notes (Addendum)
One week ago sniffles, 2 initial tests were negative for covid.  Reports left lower  eyelid swollen, nasal drainage, throat congestion, specifically to left side of face.  No fever.    Patient has a knot on left wrist.  Denies pain

## 2021-03-11 NOTE — ED Provider Notes (Signed)
[facility]    CSN: [id number] Arrival date & time: 03/11/21  1011      History   Chief Complaint Chief Complaint  Patient presents with   Eye Problem   Wrist Problem    HPI Sara Nelson is a 45 y.o. female.   Patient presenting today with over a week of left-sided nasal congestion, sinus pain and pressure, headache, left eyelid redness, swelling, cough, sore swollen throat.  She states she has known seasonal allergies and has been off her antihistamines but restarted them upon symptoms standing up.  Has taken 2 negative COVID test since onset.  Denies fever, chills, body aches, chest pain, shortness of breath.  No improvement after restarting antihistamines.  No known sick contacts recently.  She also has a knot on her left wrist that is not painful or changing but does concern her as she lost a family member to count.  Has not tried anything for symptoms thus far.   Past Medical History:  Diagnosis Date   Endometriosis    Fibroid    History of COVID-19    Hyperlipidemia    Iron deficiency anemia    per pt   Sarcoid     Patient Active Problem List   Diagnosis Date Noted   IDA (iron deficiency anemia) 07/26/2020   Back spasm 08/21/2019   Simple chronic bronchitis ([place]) 05/30/2019   Vitamin D deficiency 12/18/2018   Sciatica of right side 10/07/2017   OCD (obsessive compulsive disorder) 09/23/2015   Yeast vaginitis 03/21/2014   Seasonal and perennial allergic rhinitis 03/21/2014   Endometriosis of pelvic peritoneum 11/21/2012   Acute upper respiratory infections of unspecified site 11/04/2012   Coryza 11/04/2012   Allergic urticaria 05/27/2012   Insomnia 05/27/2012   Sarcoid ([place]) 02/01/2012    Past Surgical History:  Procedure Laterality Date   CESAREAN SECTION     LAPAROTOMY  2003   missing one tube and ovary? Endometriosis   NECK SURGERY  01-24-12   removed lymph node    OB History     Gravida  3   Para  2   Term  2   Preterm       AB  1   Living  2      SAB      IAB      Ectopic      Multiple      Live Births  2            Home Medications    Prior to Admission medications   Medication Sig Start Date End Date Taking? Authorizing Provider  Ascorbic Acid (VITAMIN C PO) Take by mouth.   Yes [provider]  doxycycline (VIBRAMYCIN) 100 MG capsule Take 1 capsule (100 mg total) by mouth 2 (two) times daily. 03/11/21  Yes Volney American, PA-C  fexofenadine (ALLEGRA) 60 MG tablet Take 60 mg by mouth 2 (two) times daily.   Yes [provider]  levocetirizine (XYZAL) 5 MG tablet Take 1 tablet (5 mg total) by mouth every evening. 03/11/21  Yes Volney American, PA-C  VITAMIN D, CHOLECALCIFEROL, PO Take by mouth.   Yes [provider]  clotrimazole-betamethasone (LOTRISONE) cream Apply to affected area 2 times daily. 01/03/21   Pearson Forster, NP  Ferrous Sulfate (IRON) 325 (65 Fe) MG TABS Take 2 tablets by mouth daily.    [provider]  UNABLE TO FIND Med Name: pt reports taking black seed oil once per day  [provider]    Family History Family History  Problem Relation Age of Onset   Asthma Father        childhood-smoker   Asthma Sister    Asthma Brother    Diabetes Paternal Grandmother    Breast cancer Maternal Aunt    Diabetes Maternal Grandmother    Hypertension Maternal Grandmother    Endometriosis Cousin     Social History Social History   Tobacco Use   Smoking status: Never   Smokeless tobacco: Never  Vaping Use   Vaping Use: Never used  Substance Use Topics   Alcohol use: Not Currently   Drug use: Not Currently     Allergies   Ceftin [cefuroxime axetil] and Latex   Review of Systems Review of Systems Per HPI  Physical Exam Triage Vital Signs ED Triage Vitals  Enc Vitals Group     BP 03/11/21 1104 109/77     Pulse Rate 03/11/21 1104 79     Resp 03/11/21 1104 18     Temp 03/11/21 1104 98.8 F (37.1 C)      Temp Source 03/11/21 1104 Oral     SpO2 03/11/21 1104 100 %     Weight --      Height --      Head Circumference --      Peak Flow --      Pain Score 03/11/21 1059 0     Pain Loc --      Pain Edu? --      Excl. in [place]? --    No data found.  Updated Vital Signs BP 109/77 (BP Location: Right Arm)   Pulse 79   Temp 98.8 F (37.1 C) (Oral)   Resp 18   LMP 03/08/2021   SpO2 100%   Visual Acuity Right Eye Distance:   Left Eye Distance:   Bilateral Distance:    Right Eye Near:   Left Eye Near:    Bilateral Near:     Physical Exam Vitals and nursing note reviewed.  Constitutional:      Appearance: Normal appearance. She is not ill-appearing.  HENT:     Head: Atraumatic.     Ears:     Comments: Left maxillar sinus ttp    Nose: Congestion present.     Comments: Significant nasal mucosa edema and erythema b/l, worst on left    Mouth/Throat:     Pharynx: Posterior oropharyngeal erythema present.  Eyes:     Extraocular Movements: Extraocular movements intact.     Conjunctiva/sclera: Conjunctivae normal.  Cardiovascular:     Rate and Rhythm: Normal rate and regular rhythm.     Heart sounds: Normal heart sounds.  Pulmonary:     Effort: Pulmonary effort is normal.     Breath sounds: Normal breath sounds. No wheezing or rales.  Musculoskeletal:        General: Normal range of motion.     Cervical back: Normal range of motion and neck supple.     Comments: 0.75 cm ganglion cyst left wrist, nontender to palpation  Skin:    General: Skin is warm and dry.  Neurological:     Mental Status: She is alert and oriented to person, place, and time.  Psychiatric:        Mood and Affect: Mood normal.        Thought Content: Thought content normal.        Judgment: Judgment normal.     UC Treatments / Results  Labs (all labs ordered are listed, but only abnormal results are displayed) Labs Reviewed - No data to display  EKG   Radiology No results  found.  Procedures Procedures (including critical care time)  Medications Ordered in UC Medications - No data to display  Initial Impression / Assessment and Plan / UC Course  I have reviewed the triage vital signs and the nursing notes.  Pertinent labs & imaging results that were available during my care of the patient were reviewed by me and considered in my medical decision making (see chart for details).     Suspect seasonal allergy exacerbation leading to acute sinusitis.  We will restart good allergy regimen, discussed sinus rinses.  She declines use of nasal sprays but this was recommended.  She also declines prednisone to help with the inflammatory component to her symptoms as she has had significant side effects in the past with this.  Regarding her left lower eyelid swelling, recommended erythromycin ointment but she is not comfortable putting anything in the eye so will rely on the antibiotics given for the sinus infection to also cover this.  Warm compresses if tolerated.  Follow-up with PCP for recheck symptoms.  Final Clinical Impressions(s) / UC Diagnoses   Final diagnoses:  Seasonal allergies  Hordeolum externum of left lower eyelid  Acute non-recurrent maxillary sinusitis  Ganglion cyst   Discharge Instructions   None    ED Prescriptions     Medication Sig Dispense Auth. Provider   levocetirizine (XYZAL) 5 MG tablet Take 1 tablet (5 mg total) by mouth every evening. 30 tablet Volney American, [place]   doxycycline (VIBRAMYCIN) 100 MG capsule Take 1 capsule (100 mg total) by mouth 2 (two) times daily. 14 capsule Volney American, [place]      PDMP not reviewed this encounter.   Volney American, [place] 03/11/21 1217

## 2021-03-31 ENCOUNTER — Telehealth: Payer: Self-pay | Admitting: Internal Medicine

## 2021-03-31 ENCOUNTER — Other Ambulatory Visit: Payer: Self-pay

## 2021-03-31 ENCOUNTER — Encounter: Payer: Self-pay | Admitting: Adult Health

## 2021-03-31 ENCOUNTER — Ambulatory Visit (INDEPENDENT_AMBULATORY_CARE_PROVIDER_SITE_OTHER): Payer: Medicaid Other

## 2021-03-31 ENCOUNTER — Ambulatory Visit (INDEPENDENT_AMBULATORY_CARE_PROVIDER_SITE_OTHER): Payer: Medicaid Other | Admitting: Adult Health

## 2021-03-31 ENCOUNTER — Emergency Department (HOSPITAL_COMMUNITY)
Admission: EM | Admit: 2021-03-31 | Discharge: 2021-03-31 | Discharge status: Against Medical Advice | Payer: Medicaid Other | Attending: Emergency Medicine | Admitting: Emergency Medicine

## 2021-03-31 VITALS — BP 112/74 | HR 86 | Temp 98.3°F | Ht 65.0 in | Wt 164.0 lb

## 2021-03-31 DIAGNOSIS — Z77120 Contact with and (suspected) exposure to mold (toxic): Secondary | ICD-10-CM | POA: Insufficient documentation

## 2021-03-31 DIAGNOSIS — D869 Sarcoidosis, unspecified: Secondary | ICD-10-CM | POA: Diagnosis not present

## 2021-03-31 MED ORDER — PREDNISONE 20 MG PO TABS: 20.0000 mg | ORAL_TABLET | Freq: Every day | ORAL | 0 refills | Status: DC

## 2021-03-31 NOTE — Assessment & Plan Note (Signed)
Exposure to mold at home  Advised to avoid exposure  Check Aspergillus IGE panel  Restart allegra

## 2021-03-31 NOTE — Telephone Encounter (Signed)
At lab at [facility] and the lab requested is not visible on Epic at the [facility] lab   Reentered Aspergillus IgE and confirmed [facility] lab received order.

## 2021-03-31 NOTE — Patient Instructions (Addendum)
Prednisone '20mg'$  daily for 1 week and then stop .  Restart Allegra '180mg'$  daily  Delsym 2 tsp Twice daily  As needed  cough  Chest xray today .  Follow up with Dr. Annamaria Boots  in 3 months and As needed   Please contact office for sooner follow up if symptoms do not improve or worsen or seek emergency care

## 2021-03-31 NOTE — Progress Notes (Signed)
$'@Patient'Y$  ID: Sara Nelson, female    DOB: 1975-08-06, 45 y.o.   MRN: [id number]  Chief Complaint  Patient presents with   Follow-up    Referring provider: No ref. provider found  HPI: 45 yo female never smoker followed for Sarcoid and Urticaria   TEST/EVENTS :   03/31/2021 Follow up: Sarcoid  Patient presents for a follow up visit. Complains over last 2-3 months ago started having upper upper respiratory symptoms with puffy eyes, nasal congestion, drainage, cough , rash on neck. Noticed  Mold on walls. Landlord put extra vents in bathroom. Has not helped any .  Wants to have blood tested for mold.  Also, has rodents in attic.  She has sought legal assistance .  Complains of dry cough and sinus symptoms . Seen in ER , given Xyzal and Doxycycline .  Some better but still has ongoing dry cough .  No hemoptysis or fever. Tested for Covid x 2 was negative .     Allergies  Allergen Reactions   Ceftin [Cefuroxime Axetil] Shortness Of Breath   Latex Rash and Other (See Comments)    Redish skin    Immunization History  Administered Date(s) Administered   HPV 9-valent 10/28/2017    Past Medical History:  Diagnosis Date   Endometriosis    Fibroid    History of COVID-19    Hyperlipidemia    Iron deficiency anemia    per pt   Sarcoid     Tobacco History: Social History   Tobacco Use  Smoking Status Never  Smokeless Tobacco Never   Counseling given: Not Answered   Outpatient Medications Prior to Visit  Medication Sig Dispense Refill   Ascorbic Acid (VITAMIN C PO) Take by mouth.     clotrimazole-betamethasone (LOTRISONE) cream Apply to affected area 2 times daily. 15 g 0   doxycycline (VIBRAMYCIN) 100 MG capsule Take 1 capsule (100 mg total) by mouth 2 (two) times daily. 14 capsule 0   Ferrous Sulfate (IRON) 325 (65 Fe) MG TABS Take 2 tablets by mouth daily.     fexofenadine (ALLEGRA) 60 MG tablet Take 60 mg by mouth 2 (two) times daily.     levocetirizine  (XYZAL) 5 MG tablet Take 1 tablet (5 mg total) by mouth every evening. 30 tablet 2   UNABLE TO FIND Med Name: pt reports taking black seed oil once per day     VITAMIN D, CHOLECALCIFEROL, PO Take by mouth.     No facility-administered medications prior to visit.     Review of Systems:   Constitutional:   No  weight loss, night sweats,  Fevers, chills, + fatigue, or  lassitude.  HEENT:   No headaches,  Difficulty swallowing,  Tooth/dental problems, or  Sore throat,                No sneezing, itching, ear ache,  +nasal congestion, post nasal drip,   CV:  No chest pain,  Orthopnea, PND, swelling in lower extremities, anasarca, dizziness, palpitations, syncope.   GI  No heartburn, indigestion, abdominal pain, nausea, vomiting, diarrhea, change in bowel habits, loss of appetite, bloody stools.   Resp:   No chest wall deformity  Skin: no rash or lesions.  GU: no dysuria, change in color of urine, no urgency or frequency.  No flank pain, no hematuria   MS:  No joint pain or swelling.  No decreased range of motion.  No back pain.    Physical Exam  BP 112/74 (BP  Location: Left Arm, Patient Position: Sitting, Cuff Size: Normal)   Pulse 86   Temp 98.3 F (36.8 C) (Oral)   Ht '5\' 5"'$  (1.651 m)   Wt 164 lb (74.4 kg)   LMP 03/08/2021   SpO2 98%   BMI 27.29 kg/m   GEN: A/Ox3; pleasant , NAD, well nourished    HEENT:  [place]/AT,  EACs-clear, TMs-wnl, NOSE-clear, THROAT-clear, no lesions, no postnasal drip or exudate noted.   NECK:  Supple w/ fair ROM; no JVD; normal carotid impulses w/o bruits; no thyromegaly or nodules palpated; no lymphadenopathy.    RESP  Clear  P & A; w/o, wheezes/ rales/ or rhonchi. no accessory muscle use, no dullness to percussion  CARD:  RRR, no m/r/g, no peripheral edema, pulses intact, no cyanosis or clubbing.  GI:   Soft & nt; nml bowel sounds; no organomegaly or masses detected.   Musco: Warm bil, no deformities or joint swelling noted.   Neuro: alert,  no focal deficits noted.    Skin: Warm, no lesions or rashes        BNP No results found for: BNP  ProBNP No results found for: PROBNP  Imaging: No results found.    PFT Results Latest Ref Rng & Units 08/19/2017  FVC-Pre L 2.94  FVC-Predicted Pre % 92  FVC-Post L 2.84  FVC-Predicted Post % 89  Pre FEV1/FVC % % 86  Post FEV1/FCV % % 92  FEV1-Pre L 2.54  FEV1-Predicted Pre % 96  FEV1-Post L 2.62  DLCO uncorrected ml/min/mmHg 19.20  DLCO UNC% % 75  DLCO corrected ml/min/mmHg 18.76  DLCO COR %Predicted % 73  DLVA Predicted % 94  TLC L 4.22  TLC % Predicted % 81  RV % Predicted % 73    No results found for: NITRICOXIDE      Assessment & Plan:   Sarcoid (HCC) Mild flare with URI symptoms  Short steroid burst  Chest xray   Plan  Patient Instructions  Prednisone '20mg'$  daily for 1 week and then stop .  Restart Allegra '180mg'$  daily  Delsym 2 tsp Twice daily  As needed  cough  Chest xray today .  Follow up with Dr. Annamaria Boots  in 3 months and As needed   Please contact office for sooner follow up if symptoms do not improve or worsen or seek emergency care       Mold exposure Exposure to mold at home  Advised to avoid exposure  Check Aspergillus IGE panel  Restart allegra      Rexene Edison, NP 03/31/2021

## 2021-03-31 NOTE — Assessment & Plan Note (Addendum)
Mild flare with URI symptoms  Short steroid burst  Chest xray   Plan  Patient Instructions  Prednisone '20mg'$  daily for 1 week and then stop .  Restart Allegra '180mg'$  daily  Delsym 2 tsp Twice daily  As needed  cough  Chest xray today .  Follow up with Dr. Annamaria Boots  in 3 months and As needed   Please contact office for sooner follow up if symptoms do not improve or worsen or seek emergency care

## 2021-03-31 NOTE — Addendum Note (Signed)
Addended by: Dessie Coma on: 03/31/2021 05:12 PM   Modules accepted: Orders

## 2021-04-04 LAB — MISC LABCORP TEST (SEND OUT): Labcorp test code: 164285

## 2021-04-06 LAB — MISC LABCORP TEST (SEND OUT): Labcorp test code: 9985

## 2021-04-06 LAB — ASPERGILLUS ANTIBODY BY IMMUNODIFF
Aspergillus flavus: NEGATIVE
Aspergillus fumigatus, IgG: NEGATIVE
Aspergillus niger: NEGATIVE

## 2021-04-06 NOTE — Progress Notes (Signed)
Sara Nelson [address] [place] Phone: [phone] Subjective:   [name], am serving as a scribe for Dr. Hulan Saas. This visit occurred during the SARS-CoV-2 public health emergency.  Safety protocols were in place, including screening questions prior to the visit, additional usage of staff PPE, and extensive cleaning of exam room while observing appropriate contact time as indicated for disinfecting solutions.   I'm seeing this patient by the request  of:  Patient, No Pcp Per (Inactive)  CC: Low back pain  [id number]  09/04/2019 Patient back spasm is significantly better.  Has had spinal stenosis but did respond extremely well to the epidural in May and had not had any significant radicular symptoms.  Still some very mild left-sided discomfort on exam today but patient should do well with conservative therapy see me again as needed  Updated 04/06/2021 Sara Nelson is a 45 y.o. female coming in with complaint of back pain. Last weekend she went to her mothers house and slept on an uncomfortable bed. Since she has had pain primarily on her left side, but the pain spans the lower lumbar area on both sides. It is sharp and consistent in character. Currently taking prednisone.  Patient states that the dose is significantly lower at 20 mg and not making any significant differences.  States that the dose we have given her previously was significantly more beneficial.  Patient has had an MRI of the back which we have reviewed previously and does have severe spinal stenosis at L4-L5.  Patient's last epidural that was in 2020.       Past Medical History:  Diagnosis Date   Endometriosis    Fibroid    History of COVID-19    Hyperlipidemia    Iron deficiency anemia    per pt   Sarcoid    Past Surgical History:  Procedure Laterality Date   CESAREAN SECTION     LAPAROTOMY  2003   missing one tube and ovary? Endometriosis   NECK  SURGERY  01-24-12   removed lymph node   Social History   Socioeconomic History   Marital status: Single    Spouse name: Not on file   Number of children: 2   Years of education: Not on file   Highest education level: Not on file  Occupational History   Occupation: Art therapist  Tobacco Use   Smoking status: Never   Smokeless tobacco: Never  Vaping Use   Vaping Use: Never used  Substance and Sexual Activity   Alcohol use: Not Currently   Drug use: Not Currently   Sexual activity: Not Currently    Partners: Male    Birth control/protection: None  Other Topics Concern   Not on file  Social History Narrative   Not on file   Social Determinants of Health   Financial Resource Strain: Not on file  Food Insecurity: Not on file  Transportation Needs: Not on file  Physical Activity: Not on file  Stress: Not on file  Social Connections: Not on file   Allergies  Allergen Reactions   Ceftin [Cefuroxime Axetil] Shortness Of Breath   Latex Rash and Other (See Comments)    Redish skin   Family History  Problem Relation Age of Onset   Asthma Father        childhood-smoker   Asthma Sister    Asthma Brother    Diabetes Paternal Grandmother    Breast cancer Maternal Aunt  Diabetes Maternal Grandmother    Hypertension Maternal Grandmother    Endometriosis Cousin     Current Outpatient Medications (Endocrine & Metabolic):    predniSONE (DELTASONE) 50 MG tablet, Take one tablet daily for the next 5 days.   Current Outpatient Medications (Respiratory):    fexofenadine (ALLEGRA) 60 MG tablet, Take 60 mg by mouth 2 (two) times daily.   levocetirizine (XYZAL) 5 MG tablet, Take 1 tablet (5 mg total) by mouth every evening.   Current Outpatient Medications (Hematological):    Ferrous Sulfate (IRON) 325 (65 Fe) MG TABS, Take 2 tablets by mouth daily.  Current Outpatient Medications (Other):    tiZANidine (ZANAFLEX) 4 MG tablet, Take 1 tablet (4 mg total) by mouth at  bedtime.   Ascorbic Acid (VITAMIN C PO), Take by mouth.   clotrimazole-betamethasone (LOTRISONE) cream, Apply to affected area 2 times daily.   doxycycline (VIBRAMYCIN) 100 MG capsule, Take 1 capsule (100 mg total) by mouth 2 (two) times daily.   UNABLE TO FIND, Med Name: pt reports taking black seed oil once per day   VITAMIN D, CHOLECALCIFEROL, PO, Take by mouth.   Reviewed prior external information including notes and imaging from  primary care provider As well as notes that were available from care everywhere and other healthcare systems.  Past medical history, social, surgical and family history all reviewed in electronic medical record.  No pertanent information unless stated regarding to the chief complaint.   Review of Systems:  No headache, visual changes, nausea, vomiting, diarrhea, constipation, dizziness, abdominal pain, skin rash, fevers, chills, night sweats, weight loss, swollen lymph nodes, body aches, joint swelling, chest pain, shortness of breath, mood changes. POSITIVE muscle aches  Objective  Blood pressure 108/68, pulse 69, height '5\' 5"'$  (1.651 m), weight 168 lb (76.2 kg), last menstrual period 03/08/2021, SpO2 99 %.   General: No apparent distress alert and oriented x3 mood and affect normal, dressed appropriately.  HEENT: Pupils equal, extraocular movements intact  Respiratory: Patient's speak in full sentences and does not appear short of breath  Cardiovascular: No lower extremity edema, non tender, no erythema  Gait normal with good balance and coordination.  MSK:  Non tender with full range of motion and good stability and symmetric strength and tone of shoulders, elbows, wrist, hip, knee and ankles bilaterally.  Low back exam no shows the patient does have loss of lordosis.  More tightness noted in the low back.  Less pain with radicular symptoms with straight leg test noted but more pain with some extension.  No midline tenderness.  Neurovascular intact distally  patient no positive straight leg test does have more tightness noted on the right than the left..   Impression and Recommendations:     The above documentation has been reviewed and is accurate and complete Sara Pulley, DO

## 2021-04-07 ENCOUNTER — Other Ambulatory Visit: Payer: Self-pay

## 2021-04-07 ENCOUNTER — Ambulatory Visit (INDEPENDENT_AMBULATORY_CARE_PROVIDER_SITE_OTHER): Payer: Medicaid Other | Admitting: Family Medicine

## 2021-04-07 DIAGNOSIS — M5431 Sciatica, right side: Secondary | ICD-10-CM

## 2021-04-07 MED ORDER — PREDNISONE 50 MG PO TABS: ORAL_TABLET | ORAL | 0 refills | Status: DC

## 2021-04-07 MED ORDER — KETOROLAC TROMETHAMINE 30 MG/ML IJ SOLN
30.0000 mg | Freq: Once | INTRAMUSCULAR | Status: AC
  Administered 2021-04-07: 30 mg via INTRAMUSCULAR

## 2021-04-07 MED ORDER — TIZANIDINE HCL 4 MG PO TABS: 4.0000 mg | ORAL_TABLET | Freq: Every day | ORAL | 0 refills | Status: DC

## 2021-04-07 MED ORDER — METHYLPREDNISOLONE ACETATE 40 MG/ML IJ SUSP
40.0000 mg | Freq: Once | INTRAMUSCULAR | Status: AC
  Administered 2021-04-07: 40 mg via INTRAMUSCULAR

## 2021-04-07 NOTE — Patient Instructions (Addendum)
Prednisone '50mg'$   for 5 days- Start tmrw Do not take any NSAIDS with prednisone  Zanaflex '4mg'$   See me again in 3 weeks

## 2021-04-07 NOTE — Assessment & Plan Note (Signed)
Worsening sciatica and back spasm again.  Patient had a refill of the prednisone at a higher dose and discontinue the 20 mg.  In addition to this we discussed the Zanaflex and this was refilled again to take at bedtime.  Patient has home exercises.  Could potentially be another candidate for possible epidural if necessary.  Follow-up with me again in 3 to 4 weeks otherwise

## 2021-04-10 NOTE — Progress Notes (Signed)
ATC x1.  LM to return call. 

## 2021-04-12 ENCOUNTER — Encounter: Payer: Self-pay | Admitting: *Deleted

## 2021-04-12 NOTE — Progress Notes (Signed)
ATC x2.  LM to return call.  Closing encounter per policy.  Unable to reach letter sent.

## 2021-05-03 ENCOUNTER — Ambulatory Visit: Payer: Medicaid Other | Admitting: Obstetrics and Gynecology

## 2021-05-19 ENCOUNTER — Ambulatory Visit: Payer: Medicaid Other | Admitting: Family Medicine

## 2021-05-19 ENCOUNTER — Telehealth: Payer: Self-pay | Admitting: Family Medicine

## 2021-05-19 NOTE — Telephone Encounter (Signed)
I spoke to the patient regarding rescheduling her appointment with Dr Tamala Julian. She said that she thinks she may need another epidural at some point? Her pain level is a 3 (out of 10). She was concerned that she would wait too long and the pain would get too far along.  Patient is scheduled with Dr Glennon Mac to follow up on November 7th (if this is needed).

## 2021-05-22 NOTE — Telephone Encounter (Signed)
Sent patient MyChart message regarding epidural and appointment.

## 2021-05-26 ENCOUNTER — Other Ambulatory Visit: Payer: Self-pay

## 2021-05-26 ENCOUNTER — Encounter: Payer: Self-pay | Admitting: Nurse Practitioner

## 2021-05-26 ENCOUNTER — Ambulatory Visit (INDEPENDENT_AMBULATORY_CARE_PROVIDER_SITE_OTHER): Payer: Medicaid Other | Admitting: Nurse Practitioner

## 2021-05-26 VITALS — BP 114/66

## 2021-05-26 DIAGNOSIS — N898 Other specified noninflammatory disorders of vagina: Secondary | ICD-10-CM

## 2021-05-26 DIAGNOSIS — Z113 Encounter for screening for infections with a predominantly sexual mode of transmission: Secondary | ICD-10-CM | POA: Diagnosis not present

## 2021-05-26 LAB — WET PREP FOR TRICH, YEAST, CLUE

## 2021-05-26 NOTE — Progress Notes (Signed)
   Acute Office Visit  Subjective:    Patient ID: Sara Nelson, female    DOB: Oct 10, 1975, 45 y.o.   MRN: [id number]   HPI 45 y.o. presents today for vaginal discharge and STD screening. New sexual partner 2 weeks ago.    Review of Systems  Constitutional: Negative.   Genitourinary:  Positive for vaginal discharge. Negative for genital sores and vaginal pain.      Objective:    Physical Exam Constitutional:      Appearance: Normal appearance.  Genitourinary:    General: Normal vulva.     Vagina: Normal.     Cervix: Normal.    BP 114/66   LMP 05/04/2021  Wt Readings from Last 3 Encounters:  04/07/21 168 lb (76.2 kg)  03/31/21 164 lb (74.4 kg)  02/01/21 166 lb (75.3 kg)   Wet prep negative     Assessment & Plan:   Problem List Items Addressed This Visit   None Visit Diagnoses     Vaginal discharge    -  Primary   Relevant Orders   WET PREP FOR [place], YEAST, CLUE   Screen for STD (sexually transmitted disease)       Relevant Orders   SURESWAB CT/NG/T. vaginalis   RPR   HIV Antibody (routine testing w rflx)      Plan: Negative wet prep and exam. STD panel pending.      Tamela Gammon DNP, 2:38 PM 05/26/2021

## 2021-05-29 LAB — SURESWAB CT/NG/T. VAGINALIS
C. trachomatis RNA, TMA: NOT DETECTED
N. gonorrhoeae RNA, TMA: NOT DETECTED
Trichomonas vaginalis RNA: NOT DETECTED

## 2021-05-29 LAB — RPR: RPR Ser Ql: NONREACTIVE

## 2021-05-29 LAB — HIV ANTIBODY (ROUTINE TESTING W REFLEX): HIV 1&2 Ab, 4th Generation: NONREACTIVE

## 2021-06-05 ENCOUNTER — Other Ambulatory Visit: Payer: Self-pay

## 2021-06-05 ENCOUNTER — Ambulatory Visit: Payer: Medicaid Other | Admitting: Sports Medicine

## 2021-06-05 VITALS — BP 110/70 | HR 77 | Ht 65.0 in | Wt 165.0 lb

## 2021-06-05 DIAGNOSIS — M48061 Spinal stenosis, lumbar region without neurogenic claudication: Secondary | ICD-10-CM | POA: Insufficient documentation

## 2021-06-05 DIAGNOSIS — M5442 Lumbago with sciatica, left side: Secondary | ICD-10-CM

## 2021-06-05 DIAGNOSIS — G8929 Other chronic pain: Secondary | ICD-10-CM | POA: Diagnosis not present

## 2021-06-05 DIAGNOSIS — M5441 Lumbago with sciatica, right side: Secondary | ICD-10-CM | POA: Diagnosis not present

## 2021-06-05 MED ORDER — MELOXICAM 15 MG PO TABS: 15.0000 mg | ORAL_TABLET | Freq: Every day | ORAL | 0 refills | Status: DC

## 2021-06-05 NOTE — Patient Instructions (Addendum)
Good to see you  Meloxicam 15mg  daily for 2-3 weeks  Rest as needed Referral to physical therapy has been placed  Follow up in 4 weeks

## 2021-06-05 NOTE — Progress Notes (Signed)
Sara Nelson [place] Phone: [phone]   Assessment and Plan:     1. 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  If no improvement or worsening of symptoms could consider repeat epidural versus facet injections.  Otherwise could consider continued PT with intermittent NSAID use for maintenance   Subjective:   I, Sara Nelson, am serving as a scribe for Dr. Glennon Nelson  Chief Complaint: Low back pain follow up   HPI:   06/05/21 Patient is a 45 year old female presenting with low back pian. Patient was last seen by Dr. Tamala Nelson on 04/07/21 for the same reason and was prescribed prednisone and Zanaflex. Today patient states that the week after her visit with Sara Nelson was great but now is 50/50, some days she can bend and be fine but other days the pain is so bad. Currently 1/10 but during the episodes 6-7/10. Sitting up straight the pain will be 3/10 but when slouching the pain goes down to a 1/10.  Relevant Historical Information: Lumbar spinal stenosis  Additional pertinent review of systems negative.   Current Outpatient Medications:    meloxicam (MOBIC) 15 MG tablet, Take 1 tablet (15 mg total) by mouth daily., Disp: 30 tablet, Rfl: 0    Ascorbic Acid (VITAMIN C PO), Take by mouth., Disp: , Rfl:    clotrimazole-betamethasone (LOTRISONE) cream, Apply to affected area 2 times daily. (Patient not taking: Reported on 05/26/2021), Disp: 15 g, Rfl: 0   Ferrous Sulfate (IRON) 325 (65 Fe) MG TABS, Take 2 tablets by mouth daily., Disp: , Rfl:    levocetirizine (XYZAL) 5 MG tablet, Take 1 tablet (5 mg total) by mouth every evening., Disp: 30 tablet, Rfl: 2   predniSONE (DELTASONE) 50 MG tablet, Take one tablet daily for the next 5 days. (Patient not taking: Reported on 05/26/2021), Disp: 5 tablet, Rfl: 0   tiZANidine (ZANAFLEX) 4 MG tablet, Take 1 tablet (4 mg total) by mouth at bedtime., Disp: 30 tablet, Rfl: 0   UNABLE TO FIND, Med Name: pt reports taking black seed oil once per day, Disp: , Rfl:    VITAMIN D, CHOLECALCIFEROL, PO, Take by mouth., Disp: , Rfl:    Objective:     Vitals:   06/05/21 1527  BP: 110/70  Pulse: 77  SpO2: 98%  Weight: 165 lb (74.8 kg)  Height: 5\' 5"  (1.651 m)      Body mass index is 27.46 kg/m.    Physical Exam:    Gen: Appears well, nad, nontoxic and pleasant Psych: Alert and oriented, appropriate mood and affect Neuro: sensation intact, strength is 5/5 in upper and lower extremities, muscle tone wnl Skin: no susupicious lesions or rashes  Back - Normal skin, Spine with normal alignment and no deformity.   No tenderness to vertebral process palpation.  Paraspinous muscles are not tender and without spasm Straight leg raise negative, but bilaterally tight hamstring Trendelenberg positive Positive piriformis stretch both sides TTP upper gluteal musculature  Electronically signed by:  Sara Nelson D.[facility] 4:35 PM 06/05/21

## 2021-06-29 NOTE — Progress Notes (Deleted)
[place] Phone: [phone] Subjective:    I'm seeing this patient by the request  of:  Patient, No Pcp Per (Inactive)  CC:   [id number]  Saw Dr. Glennon Mac 06/05/2021 1. 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  If no improvement or worsening of symptoms could consider repeat epidural versus facet injections.  Otherwise could consider continued PT with intermittent NSAID use for maintenance  Updated 06/30/2021 Sara Nelson is a 45 y.o. female coming in with complaint of LBP.  Onset-  Location Duration-  Character- Aggravating factors- Reliving factors-  Therapies tried-  Severity-     Past Medical History:  Diagnosis Date   Endometriosis    Fibroid    History of COVID-19    Hyperlipidemia    Iron deficiency anemia    per pt   Sarcoid    Past Surgical History:  Procedure Laterality Date   CESAREAN SECTION     LAPAROTOMY  2003   missing one tube and ovary? Endometriosis   NECK SURGERY  01-24-12   removed lymph node   Social History   Socioeconomic History   Marital status: Single    Spouse name: Not on file   Number of children: 2   Years of education: Not on file   Highest education level: Not on file  Occupational  History   Occupation: Art therapist  Tobacco Use   Smoking status: Never   Smokeless tobacco: Never  Vaping Use   Vaping Use: Never used  Substance and Sexual Activity   Alcohol use: Not Currently   Drug use: Not Currently   Sexual activity: Not Currently    Partners: Male    Birth control/protection: None  Other Topics Concern   Not on file  Social History Narrative   Not on file   Social Determinants of Health   Financial Resource Strain: Not on file  Food Insecurity: Not on file  Transportation Needs: Not on file  Physical Activity: Not on file  Stress: Not on file  Social Connections: Not on file   Allergies  Allergen Reactions   Ceftin [Cefuroxime Axetil] Shortness Of Breath   Latex Rash and Other (See Comments)    Redish skin   Family History  Problem Relation Age of Onset   Asthma Father        childhood-smoker   Asthma Sister    Asthma Brother    Diabetes Paternal Grandmother    Breast cancer Maternal Aunt    Diabetes Maternal Grandmother    Hypertension Maternal Grandmother    Endometriosis Cousin     Current Outpatient Medications (Endocrine & Metabolic):    predniSONE (DELTASONE) 50 MG tablet, Take one tablet daily for the next 5 days. (Patient not taking: Reported on 05/26/2021)   Current Outpatient Medications (Respiratory):    levocetirizine (  XYZAL) 5 MG tablet, Take 1 tablet (5 mg total) by mouth every evening.  Current Outpatient Medications (Analgesics):    meloxicam (MOBIC) 15 MG tablet, Take 1 tablet (15 mg total) by mouth daily.  Current Outpatient Medications (Hematological):    Ferrous Sulfate (IRON) 325 (65 Fe) MG TABS, Take 2 tablets by mouth daily.  Current Outpatient Medications (Other):    Ascorbic Acid (VITAMIN C PO), Take by mouth.   clotrimazole-betamethasone (LOTRISONE) cream, Apply to affected area 2 times daily. (Patient not taking: Reported on 05/26/2021)   tiZANidine (ZANAFLEX) 4 MG tablet, Take 1 tablet (4 mg total)  by mouth at bedtime.   UNABLE TO FIND, Med Name: pt reports taking black seed oil once per day   VITAMIN D, CHOLECALCIFEROL, PO, Take by mouth.   Reviewed prior external information including notes and imaging from  primary care provider As well as notes that were available from care everywhere and other healthcare systems.  Past medical history, social, surgical and family history all reviewed in electronic medical record.  No pertanent information unless stated regarding to the chief complaint.   Review of Systems:  No headache, visual changes, nausea, vomiting, diarrhea, constipation, dizziness, abdominal pain, skin rash, fevers, chills, night sweats, weight loss, swollen lymph nodes, body aches, joint swelling, chest pain, shortness of breath, mood changes. POSITIVE muscle aches  Objective  There were no vitals taken for this visit.   General: No apparent distress alert and oriented x3 mood and affect normal, dressed appropriately.  HEENT: Pupils equal, extraocular movements intact  Respiratory: Patient's speak in full sentences and does not appear short of breath  Cardiovascular: No lower extremity edema, non tender, no erythema  Gait normal with good balance and coordination.  MSK:  Non tender with full range of motion and good stability and symmetric strength and tone of shoulders, elbows, wrist, hip, knee and ankles bilaterally.     Impression and Recommendations:     The above documentation has been reviewed and is accurate and complete [name]

## 2021-06-30 ENCOUNTER — Ambulatory Visit: Payer: Medicaid Other | Attending: Sports Medicine | Admitting: Physical Therapy

## 2021-06-30 ENCOUNTER — Other Ambulatory Visit: Payer: Self-pay

## 2021-06-30 ENCOUNTER — Ambulatory Visit: Payer: Medicaid Other | Admitting: Family Medicine

## 2021-06-30 ENCOUNTER — Encounter: Payer: Self-pay | Admitting: Physical Therapy

## 2021-06-30 DIAGNOSIS — M545 Low back pain, unspecified: Secondary | ICD-10-CM | POA: Diagnosis not present

## 2021-06-30 DIAGNOSIS — M6281 Muscle weakness (generalized): Secondary | ICD-10-CM | POA: Insufficient documentation

## 2021-06-30 DIAGNOSIS — M5441 Lumbago with sciatica, right side: Secondary | ICD-10-CM | POA: Insufficient documentation

## 2021-06-30 DIAGNOSIS — M5442 Lumbago with sciatica, left side: Secondary | ICD-10-CM | POA: Diagnosis not present

## 2021-06-30 DIAGNOSIS — G8929 Other chronic pain: Secondary | ICD-10-CM | POA: Insufficient documentation

## 2021-06-30 NOTE — Patient Instructions (Signed)
Access Code: [email or web address] URL: [email or web address] Date: 06/30/2021 Prepared by: Hilda Blades  Exercises Supine Lower Trunk Rotation - 2 x daily - 7 x weekly - 5 reps - 10 hold Supine Single Knee to Chest Stretch - 2 x daily - 7 x weekly - 3 reps - 20 hold Supine Piriformis Stretch with Foot on Ground - 2 x daily - 7 x weekly - 3 reps - 20 hold Seated Piriformis Stretch - 2 x daily - 7 x weekly - 3 reps - 20 hold Seated Hamstring Stretch - 2 x daily - 7 x weekly - 3 reps - 20 hold

## 2021-06-30 NOTE — Therapy (Signed)
[place], [place], [address] Phone: [phone]   Fax:  [phone]  Physical Therapy Evaluation  Patient Details  Name: Sara Nelson MRN: [id number] Date of Birth: Jan 31, 1976 Referring Provider (PT): Glennon Mac, DO   Encounter Date: 06/30/2021   PT End of Session - 06/30/21 1313     Visit Number 1    Number of Visits 8    Date for PT Re-Evaluation 08/25/21    Authorization Type [employer]    Authorization - Number of Visits 27    PT Start Time 1140    PT Stop Time 1225    PT Time Calculation (min) 45 min    Activity Tolerance Patient tolerated treatment well    Behavior During Therapy [facility] for tasks assessed/performed             Past Medical History:  Diagnosis Date   Endometriosis    Fibroid    History of COVID-19    Hyperlipidemia    Iron deficiency anemia    per pt   Sarcoid     Past Surgical History:  Procedure Laterality Date   CESAREAN SECTION     LAPAROTOMY  2003   missing one tube and ovary? Endometriosis   NECK SURGERY  01-24-12   removed lymph node    There were no vitals filed for this visit.    Subjective Assessment - 06/30/21 1142     Subjective Patient reports low back pain, mainly left side with sciatica to the left knee. She was having pain on the right side with pain referred to the front of the right thigh to the knee, but medication has resolved the right sided pain for now. She has been in dentistry for 27 years and states during surgery she can't sit the whole time and has to stand which she the has to remain in a slightly bent over positiong which causes severe pain. She did have 3 epidurals and they helped until one instance where she went to the restroom and could not get off the toilet and then had to walk bent over. She then had to have injections in her buttocks which helped her pain for about 11 months, then the pain came back recently. She was  recently given medication and instructed to trial PT in order to avoid surgery. Currently she is getting sciatica in the left left. She has to use pillows in her car and other chairs in order to give her support, which helps. When patient is in pain she has to lie down and elevate her legs because she is unable to move, she hasto walk bent over and it feels like a knife when she tries to stand of sit up straight. The activities that result in her most pain is while working where she is having to sit or stand with a slide forward bend, placing stress on the lower back. Patient also states she is in 2 positions when she is pray, and she can tell when the pain is coming on becauses she will feel limited in her ability to get in those [place]. Patient reports low back will be worse with soft mattresses, will feel better with firm surface.    Limitations Lifting;Standing;Walking;House hold activities;Sitting    How long can you sit comfortably? "hasn't noticed, she believes she doesn't sit properly"    How long can you stand comfortably? "about 20 minutes will have pressure, will have to shift weight or lean forward"  How long can you walk comfortably? "will have difficulty walking up incline (5-7% on treadmill)"    Diagnostic tests MRI    Patient Stated Goals "to fix the problem, eliminate being stuck"    Currently in Pain? No/denies    Pain Score 0-No pain   when pain is severe will be 10+/10   Pain Location Back    Pain Orientation Left    Pain Type Chronic pain    Pain Radiating Towards left lower back pain, left buttock, posterior left thigh to calf when severe    Pain Onset More than a month ago    Pain Frequency Intermittent    Aggravating Factors  Bending forward, lifting, sitting/walking/standing extended periods    Pain Relieving Factors Medication, lying down with feel elevated                [facility] PT Assessment - 06/30/21 0001       Assessment   Medical Diagnosis Chronic  bilateral low back pain with bilateral sciatica    Referring Provider (PT) Glennon Mac, DO    Onset Date/Surgical Date --   pain began approximately 2019, multiple episodes since that time   Next MD Visit 06/30/2021   may cancel to allow more time for PT   Prior Therapy No      Precautions   Precautions None      Restrictions   Weight Bearing Restrictions No      Balance Screen   Has the patient fallen in the past 6 months No    Has the patient had a decrease in activity level because of a fear of falling?  No    Is the patient reluctant to leave their home because of a fear of falling?  No      Prior Function   Level of Independence Independent    Vocation Full time employment    Runner, broadcasting/film/video    Leisure Cleaning her house, "putting things together such as desk", coloring      Cognition   Overall Cognitive Status Within Functional Limits for tasks assessed      Observation/Other Assessments   Observations Patient appears in no apparent distress    Focus on Therapeutic Outcomes (FOTO)  NA - [facility]      Sensation   Additional Comments Unable to assess      Coordination   Gross Motor Movements are Fluid and Coordinated Yes      Posture/Postural Control   Posture Comments Patient appears to rest in increased lumbar lordosis in standing      ROM / Strength   AROM / PROM / Strength AROM      AROM   AROM Assessment Site Lumbar    Lumbar Flexion WFL    Lumbar Extension WFL   feels right lower back area   Lumbar - Right Side [facility]   feels right low back pain   Lumbar - Left Side Bend WFL    Lumbar - Right Rotation WFL   patient reports feeling of stretch in lower back   Lumbar - Left Rotation WFL   patient reports feeling of stretch in lower back     Flexibility   Soft Tissue Assessment /Muscle Length yes    Hamstrings Patient reports stretching left hamstring with forward bend    Piriformis Patient report R > L stretch      Palpation    Spinal mobility Unable to assess    Palpation comment Unable to  assess      Transfers   Transfers Independent with all Transfers                        Objective measurements completed on examination: See above findings.    [place] [facility] PT Treatment/Exercise:  Therapeutic Exercise: LTR 5 x 10 sec Supine SKTC stretch 2 x 20 sec Supine and seated piriformis stretch 2 x 20 sec each Seated hamstring stretch 2 x 20 sec each  Manual Therapy: N/A  Neuromuscular re-ed: N/A  Therapeutic Activity: N/A  Modalities: N/A  Self Care: N/A             PT Education - 06/30/21 1313     Education Details Exam findings, POC, HEP    Person(s) Educated Patient    Methods Explanation;Demonstration;Tactile cues;Handout;Verbal cues    Comprehension Verbalized understanding;Returned demonstration;Verbal cues required;Tactile cues required;Need further instruction              PT Short Term Goals - 06/30/21 1323       PT SHORT TERM GOAL #1   Title Patient will be I with initial HEP to progress with PT    Baseline HEP provided at eval    Time 4    Period Weeks    Status New    Target Date 07/28/21      PT SHORT TERM GOAL #2   Title Patient will report no more than 7/10 pain with episodes to reduce any functional limitations    Baseline patient reports pain can be 10+/10 with flare ups    Time 4    Period Weeks    Status New    Target Date 07/28/21      PT SHORT TERM GOAL #3   Title Patient will report no increase in pain or limitation with lumbar AROM to improve cleaning ability    Baseline patient reports feeling discomfort and tightness of lumbar region with lumbar AROM    Time 4    Period Weeks    Status New    Target Date 07/28/21               PT Long Term Goals - 06/30/21 1325       PT LONG TERM GOAL #1   Title Patient will be I with final HEP to maintain progress from PT    Baseline HEP provided at eval    Time 8    Period  Weeks    Status New    Target Date 08/25/21      PT LONG TERM GOAL #2   Title Patient will be able to demonstrate appropriate sitting posture and report no increase in low back pain with sitting >/= 1 hour to improve tolerance for working conditions    Baseline patient reports pain with sitting, feels she sits inappropriately, has to stand during dental procedures due to back pain    Time 8    Period Weeks    Status New    Target Date 08/25/21      PT LONG TERM GOAL #3   Title Establish strength goal when officially assessed    Time 8    Period Weeks    Status New    Target Date 08/25/21      PT LONG TERM GOAL #4   Title Patient will report pain level </= 3/10 with all sitting, standing, walking, and household tasks in order to maximize her functional ability    Baseline  patient reports pain can be 10+/10 at eval    Time 8    Period Weeks    Status New    Target Date 08/25/21                    Plan - 06/30/21 1314     Clinical Impression Statement Patient presents to PT with report of acute on chronic bilateral low back pain, left greater than right, with left sided sciatica symptoms. The evaluations was limited this visit due to patient requesting female therapy and was not comfortable with physical contact with female therapy so unable to assess impairments such as strength, palpation, spine mobility, passive motion. Patient did demonstrate grossly normal lumbar motion but exhibited poor motor control when rising from a flexed position, she would initiate the rise with lumbar extension rather than using gluteals/hips which indicated likely poor gluteal strength and overuse of lumbar extensors. Patient also reported feeling tight in left hamstring, bilateral lumbar with rotation and piriformis regions. Based on the limited examination, patient was provided exercises to address motion and flexibility deficits, and she would benefit from continued skilled PT to complete the  evaluation and address likely strength and motor control deficitis of the core and hip musculature in order to reduce pain and prevent recurrence of symptoms with activities such as bending and lifting.    Personal Factors and Comorbidities Fitness;Past/Current Experience;Time since onset of injury/illness/exacerbation    Examination-Activity Limitations Sit;Squat;Stand;Lift;Hygiene/Grooming;Bend;Dressing    Examination-Participation Restrictions Cleaning;Occupation;Driving    Stability/Clinical Decision Making Stable/Uncomplicated    Clinical Decision Making Low    Rehab Potential Good    PT Frequency 1x / week    PT Duration 8 weeks    PT Treatment/Interventions ADLs/Self Care Home Management;Aquatic Therapy;Cryotherapy;Electrical Stimulation;Iontophoresis 4mg /ml Dexamethasone;Moist Heat;Ultrasound;Traction;Neuromuscular re-education;Balance training;Therapeutic exercise;Therapeutic activities;Functional mobility training;Stair training;Gait training;Patient/family education;Manual techniques;Dry needling;Passive range of motion;Taping;Vasopneumatic Device;Spinal Manipulations;Joint Manipulations    PT Next Visit Plan Review HEP and progress PRN, assess patient's strength/palpation lumbar and gluteal regions, initiate core and hip strengthening, manual/dry needling for any muscular dysfunction    PT Home Exercise Plan [id number]    Consulted and Agree with Plan of Care Patient             Patient will benefit from skilled therapeutic intervention in order to improve the following deficits and impairments:  Postural dysfunction, Decreased strength, Improper body mechanics, Impaired flexibility, Decreased activity tolerance, Pain  Visit Diagnosis: Chronic bilateral low back pain, unspecified whether sciatica present  Muscle weakness (generalized)     Problem List Patient Active Problem List   Diagnosis Date Noted   Spinal stenosis of lumbar region 06/05/2021   Chronic bilateral low  back pain with bilateral sciatica 06/05/2021   Mold exposure 03/31/2021   IDA (iron deficiency anemia) 07/26/2020   Back spasm 08/21/2019   Simple chronic bronchitis ([place]) 05/30/2019   Vitamin D deficiency 12/18/2018   Sciatica of right side 10/07/2017   OCD (obsessive compulsive disorder) 09/23/2015   Yeast vaginitis 03/21/2014   Seasonal and perennial allergic rhinitis 03/21/2014   Endometriosis of pelvic peritoneum 11/21/2012   Acute upper respiratory infections of unspecified site 11/04/2012   Coryza 11/04/2012   Allergic urticaria 05/27/2012   Insomnia 05/27/2012   Sarcoid ([place]) 02/01/2012    Hilda Blades, PT, DPT, LAT, ATC 06/30/21  1:35 PM Phone: [phone] Fax: [facility] [address] [place], [place], [address] Phone: [phone]   Fax:  [phone]  Name: Sara Nelson  MRN: [id number] Date of Birth: 05-Sep-1975

## 2021-07-07 ENCOUNTER — Ambulatory Visit: Payer: Medicaid Other | Admitting: Physical Therapy

## 2021-07-12 ENCOUNTER — Telehealth: Payer: Self-pay

## 2021-07-12 ENCOUNTER — Ambulatory Visit: Payer: Medicaid Other

## 2021-07-12 NOTE — Telephone Encounter (Signed)
LVM notifying patient that her PT appointment on 07/12/21 at 6:30 is cancelled due to the provider being out of the office. Reminded patient of next scheduled appointment and to call our clinic back if she would like to reschedule tonight's visit.   Gwendolyn Grant, PT, DPT, ATC 07/12/21 4:08 PM

## 2021-07-28 ENCOUNTER — Encounter: Payer: Medicaid Other | Admitting: Physical Therapy

## 2021-08-03 NOTE — Progress Notes (Deleted)
[place] Phone: [phone] Subjective:    I'm seeing this patient by the request  of:  Patient, No Pcp Per (Inactive)  CC:   [id number]  06/05/2021 1. 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  If no improvement or worsening of symptoms could consider repeat epidural versus facet injections.  Otherwise could consider continued PT with intermittent NSAID use for maintenance  Updated 08/04/2021 Sara Nelson is a 46 y.o. female coming in with complaint of back pain       Past Medical History:  Diagnosis Date   Endometriosis    Fibroid    History of COVID-19    Hyperlipidemia    Iron deficiency anemia    per pt   Sarcoid    Past Surgical History:  Procedure Laterality Date   CESAREAN SECTION     LAPAROTOMY  2003   missing one tube and ovary? Endometriosis   NECK SURGERY  01-24-12   removed lymph node   Social History   Socioeconomic History   Marital status: Married    Spouse name: Not on file   Number of children: 2   Years of education: Not on file   Highest education level: Not on file  Occupational History   Occupation: Art therapist  Tobacco Use   Smoking status: Never   Smokeless tobacco: Never  Vaping Use    Vaping Use: Never used  Substance and Sexual Activity   Alcohol use: Not Currently   Drug use: Not Currently   Sexual activity: Not Currently    Partners: Male    Birth control/protection: None  Other Topics Concern   Not on file  Social History Narrative   Not on file   Social Determinants of Health   Financial Resource Strain: Not on file  Food Insecurity: Not on file  Transportation Needs: Not on file  Physical Activity: Not on file  Stress: Not on file  Social Connections: Not on file   Allergies  Allergen Reactions   Ceftin [Cefuroxime Axetil] Shortness Of Breath   Latex Rash and Other (See Comments)    Redish skin   Family History  Problem Relation Age of Onset   Asthma Father        childhood-smoker   Asthma Sister    Asthma Brother    Diabetes Paternal Grandmother    Breast cancer Maternal Aunt    Diabetes Maternal Grandmother    Hypertension Maternal Grandmother    Endometriosis Cousin     Current Outpatient Medications (Endocrine & Metabolic):    predniSONE (DELTASONE) 50 MG tablet, Take one tablet daily for the next 5 days. (Patient not taking: Reported on 05/26/2021)   Current Outpatient Medications (Respiratory):    levocetirizine (XYZAL) 5 MG tablet, Take 1 tablet (5 mg total) by mouth every evening.  Current  Outpatient Medications (Analgesics):    meloxicam (MOBIC) 15 MG tablet, Take 1 tablet (15 mg total) by mouth daily.  Current Outpatient Medications (Hematological):    Ferrous Sulfate (IRON) 325 (65 Fe) MG TABS, Take 2 tablets by mouth daily.  Current Outpatient Medications (Other):    Ascorbic Acid (VITAMIN C PO), Take by mouth.   clotrimazole-betamethasone (LOTRISONE) cream, Apply to affected area 2 times daily. (Patient not taking: Reported on 05/26/2021)   tiZANidine (ZANAFLEX) 4 MG tablet, Take 1 tablet (4 mg total) by mouth at bedtime.   UNABLE TO FIND, Med Name: pt reports taking black seed oil once per day   VITAMIN D,  CHOLECALCIFEROL, PO, Take by mouth.   Reviewed prior external information including notes and imaging from  primary care provider As well as notes that were available from care everywhere and other healthcare systems.  Past medical history, social, surgical and family history all reviewed in electronic medical record.  No pertanent information unless stated regarding to the chief complaint.   Review of Systems:  No headache, visual changes, nausea, vomiting, diarrhea, constipation, dizziness, abdominal pain, skin rash, fevers, chills, night sweats, weight loss, swollen lymph nodes, body aches, joint swelling, chest pain, shortness of breath, mood changes. POSITIVE muscle aches  Objective  There were no vitals taken for this visit.   General: No apparent distress alert and oriented x3 mood and affect normal, dressed appropriately.  HEENT: Pupils equal, extraocular movements intact  Respiratory: Patient's speak in full sentences and does not appear short of breath  Cardiovascular: No lower extremity edema, non tender, no erythema  Gait normal with good balance and coordination.  MSK:  Non tender with full range of motion and good stability and symmetric strength and tone of shoulders, elbows, wrist, hip, knee and ankles bilaterally.     Impression and Recommendations:     The above documentation has been reviewed and is accurate and complete Sara Nelson

## 2021-08-04 ENCOUNTER — Ambulatory Visit: Payer: Medicaid Other | Admitting: Family Medicine

## 2021-09-22 ENCOUNTER — Other Ambulatory Visit: Payer: Self-pay

## 2021-09-22 ENCOUNTER — Encounter: Payer: Self-pay | Admitting: Internal Medicine

## 2021-09-22 ENCOUNTER — Encounter: Payer: Self-pay | Admitting: Family

## 2021-09-22 ENCOUNTER — Ambulatory Visit: Payer: No Typology Code available for payment source | Admitting: Internal Medicine

## 2021-09-22 VITALS — BP 118/76 | HR 80 | Temp 98.2°F | Ht 65.0 in | Wt 170.0 lb

## 2021-09-22 DIAGNOSIS — F429 Obsessive-compulsive disorder, unspecified: Secondary | ICD-10-CM | POA: Diagnosis not present

## 2021-09-22 DIAGNOSIS — D869 Sarcoidosis, unspecified: Secondary | ICD-10-CM | POA: Diagnosis not present

## 2021-09-22 DIAGNOSIS — Z0000 Encounter for general adult medical examination without abnormal findings: Secondary | ICD-10-CM

## 2021-09-22 DIAGNOSIS — D500 Iron deficiency anemia secondary to blood loss (chronic): Secondary | ICD-10-CM | POA: Diagnosis not present

## 2021-09-22 DIAGNOSIS — Z0001 Encounter for general adult medical examination with abnormal findings: Secondary | ICD-10-CM | POA: Insufficient documentation

## 2021-09-22 DIAGNOSIS — F5101 Primary insomnia: Secondary | ICD-10-CM

## 2021-09-22 MED ORDER — ESZOPICLONE 2 MG PO TABS: 2.0000 mg | ORAL_TABLET | Freq: Every evening | ORAL | 3 refills | Status: DC | PRN

## 2021-09-22 NOTE — Assessment & Plan Note (Signed)
States present lifelong and overall she is coping with this.

## 2021-09-22 NOTE — Progress Notes (Signed)
° °  Subjective:   Patient ID: Sara Nelson, female    DOB: 02/28/1976, 46 y.o.   MRN: [id number]  HPI The patient is a 46 YO female coming in new for concerns and physical.  PMH, Susan Moore, social history reviewed and updated  Review of Systems  Constitutional:  Positive for unexpected weight change.  HENT: Negative.    Eyes: Negative.   Respiratory:  Negative for cough, chest tightness and shortness of breath.   Cardiovascular:  Negative for chest pain, palpitations and leg swelling.  Gastrointestinal:  Negative for abdominal distention, abdominal pain, constipation, diarrhea, nausea and vomiting.  Musculoskeletal: Negative.   Skin: Negative.   Neurological: Negative.   Psychiatric/Behavioral:  Positive for sleep disturbance.    Objective:  Physical Exam Constitutional:      Appearance: She is well-developed.  HENT:     Head: Normocephalic and atraumatic.  Cardiovascular:     Rate and Rhythm: Normal rate and regular rhythm.  Pulmonary:     Effort: Pulmonary effort is normal. No respiratory distress.     Breath sounds: Normal breath sounds. No wheezing or rales.  Abdominal:     General: Bowel sounds are normal. There is no distension.     Palpations: Abdomen is soft.     Tenderness: There is no abdominal tenderness. There is no rebound.  Musculoskeletal:     Cervical back: Normal range of motion.  Skin:    General: Skin is warm and dry.  Neurological:     Mental Status: She is alert and oriented to person, place, and time.     Coordination: Coordination normal.    Vitals:   09/22/21 1610  BP: 118/76  Pulse: 80  Temp: 98.2 F (36.8 C)  TempSrc: Oral  SpO2: 98%  Weight: 170 lb (77.1 kg)  Height: 5\' 5"  (1.651 m)    This visit occurred during the SARS-CoV-2 public health emergency.  Safety protocols were in place, including screening questions prior to the visit, additional usage of staff PPE, and extensive cleaning of exam room while observing appropriate contact  time as indicated for disinfecting solutions.   Assessment & Plan:

## 2021-09-22 NOTE — Assessment & Plan Note (Signed)
Checking CBC.  °

## 2021-09-22 NOTE — Assessment & Plan Note (Signed)
She is currently using ambien for 3 months and this is not working effectively. Previously tried trazodone without results. Rx lunesta 2 mg qhs and previously she has seen Dr. Annamaria Boots for evaluation without etiology.

## 2021-09-22 NOTE — Assessment & Plan Note (Signed)
Previously on steroids which caused problems with her sleep. She is doing well without medication.

## 2021-09-22 NOTE — Patient Instructions (Signed)
We will check the labs today and have sent in the lunesta to use for sleep.

## 2021-09-22 NOTE — Assessment & Plan Note (Signed)
Flu shot declines. Covid-19 declines. Tetanus declines. Colonoscopy counseled due at 48 and she is interested when due. Mammogram up to date with gyn, pap smear up to date with gyn. Counseled about sun safety and mole surveillance. Counseled about the dangers of distracted driving. Given 10 year screening recommendations.

## 2021-09-25 LAB — CBC WITH DIFFERENTIAL/PLATELET
Absolute Monocytes: 491 cells/uL (ref 200–950)
Basophils Absolute: 70 cells/uL (ref 0–200)
Basophils Relative: 0.9 %
Eosinophils Absolute: 211 cells/uL (ref 15–500)
Eosinophils Relative: 2.7 %
HCT: 35.4 % (ref 35.0–45.0)
Hemoglobin: 11.6 g/dL — ABNORMAL LOW (ref 11.7–15.5)
Lymphs Abs: 3565 cells/uL (ref 850–3900)
MCH: 26.7 pg — ABNORMAL LOW (ref 27.0–33.0)
MCHC: 32.8 g/dL (ref 32.0–36.0)
MCV: 81.4 fL (ref 80.0–100.0)
MPV: 9.8 fL (ref 7.5–12.5)
Monocytes Relative: 6.3 %
Neutro Abs: 3463 cells/uL (ref 1500–7800)
Neutrophils Relative %: 44.4 %
Platelets: 478 10*3/uL — ABNORMAL HIGH (ref 140–400)
RBC: 4.35 10*6/uL (ref 3.80–5.10)
RDW: 13.2 % (ref 11.0–15.0)
Total Lymphocyte: 45.7 %
WBC: 7.8 10*3/uL (ref 3.8–10.8)

## 2021-09-25 LAB — LIPID PANEL
Cholesterol: 174 mg/dL (ref <200)
HDL: 57 mg/dL (ref >=50)
LDL Cholesterol (Calc): 101 mg/dL (calc) — ABNORMAL HIGH
Non-HDL Cholesterol (Calc): 117 mg/dL (calc) (ref <130)
Total CHOL/HDL Ratio: 3.1 (calc) (ref <5.0)
Triglycerides: 70 mg/dL (ref <150)

## 2021-09-25 LAB — COMPREHENSIVE METABOLIC PANEL
AG Ratio: 1.6 (calc) (ref 1.0–2.5)
ALT: 7 U/L (ref 6–29)
AST: 16 U/L (ref 10–30)
Albumin: 4.2 g/dL (ref 3.6–5.1)
Alkaline phosphatase (APISO): 54 U/L (ref 31–125)
BUN: 13 mg/dL (ref 7–25)
CO2: 27 mmol/L (ref 20–32)
Calcium: 9.3 mg/dL (ref 8.6–10.2)
Chloride: 104 mmol/L (ref 98–110)
Creat: 0.82 mg/dL (ref 0.50–0.99)
Globulin: 2.7 g/dL (calc) (ref 1.9–3.7)
Glucose, Bld: 91 mg/dL (ref 65–99)
Potassium: 4.2 mmol/L (ref 3.5–5.3)
Sodium: 138 mmol/L (ref 135–146)
Total Bilirubin: 0.6 mg/dL (ref 0.2–1.2)
Total Protein: 6.9 g/dL (ref 6.1–8.1)

## 2021-09-25 LAB — HIV ANTIBODY (ROUTINE TESTING W REFLEX): HIV 1&2 Ab, 4th Generation: NONREACTIVE

## 2021-09-27 ENCOUNTER — Encounter: Payer: Self-pay | Admitting: Obstetrics and Gynecology

## 2021-09-27 ENCOUNTER — Ambulatory Visit (INDEPENDENT_AMBULATORY_CARE_PROVIDER_SITE_OTHER): Payer: No Typology Code available for payment source | Admitting: Obstetrics and Gynecology

## 2021-09-27 ENCOUNTER — Other Ambulatory Visit: Payer: Self-pay

## 2021-09-27 VITALS — BP 124/67 | HR 77 | Wt 173.0 lb

## 2021-09-27 DIAGNOSIS — N939 Abnormal uterine and vaginal bleeding, unspecified: Secondary | ICD-10-CM

## 2021-09-27 DIAGNOSIS — Z862 Personal history of diseases of the blood and blood-forming organs and certain disorders involving the immune mechanism: Secondary | ICD-10-CM

## 2021-09-27 DIAGNOSIS — D259 Leiomyoma of uterus, unspecified: Secondary | ICD-10-CM

## 2021-09-27 DIAGNOSIS — Z113 Encounter for screening for infections with a predominantly sexual mode of transmission: Secondary | ICD-10-CM

## 2021-09-27 DIAGNOSIS — R5383 Other fatigue: Secondary | ICD-10-CM | POA: Diagnosis not present

## 2021-09-27 LAB — TSH: TSH: 4.2 mIU/L

## 2021-09-27 LAB — FERRITIN: Ferritin: 8 ng/mL — ABNORMAL LOW (ref 16–232)

## 2021-09-27 NOTE — Telephone Encounter (Signed)
Appointment please schedule OV with Dr.Jertson.  ?

## 2021-09-27 NOTE — Progress Notes (Signed)
GYNECOLOGY  VISIT ?  ?HPI: ?46 y.o.   Married Black or [place] American Not Hispanic or Latino  female   ?[id number] with No LMP recorded.   ?here for cramping and bleeding.She moved into a new apartment over the weekend.  She has had her current pad on since lunch She has been having trouble sleeping as well.  ? ?She has a known fibroid uterus. ?Over the last several months her cycles have been every 23 days x 7-8 days. She is saturating a overnight pad in 2-3 hours (improved from last year when she was saturating a pad in one hour).  ?LMP was 09/13/21. She started having light bleeding this morning. She is having moderate menstrual like cramps.  ? ?She c/o fatigue and weight gain, no other thyroid c/o. Not sexually active.  ?Labs 09/22/21: Hgb 11.6 ? ?12/29/20 Pap ASCUS, negative hpv ? ?GYNECOLOGIC HISTORY: ?No LMP recorded. ?Contraception:not sexually active  ?Menopausal hormone therapy: none  ?       ?OB History   ? ? Gravida  ?3  ? Para  ?2  ? Term  ?2  ? Preterm  ?   ? AB  ?1  ? Living  ?2  ?  ? ? SAB  ?   ? IAB  ?   ? Ectopic  ?   ? Multiple  ?   ? Live Births  ?2  ?   ?  ?  ?    ? ?Patient Active Problem List  ? Diagnosis Date Noted  ? Encounter for general adult medical examination with abnormal findings 09/22/2021  ? Chronic bilateral low back pain with bilateral sciatica 06/05/2021  ? IDA (iron deficiency anemia) 07/26/2020  ? OCD (obsessive compulsive disorder) 09/23/2015  ? Seasonal and perennial allergic rhinitis 03/21/2014  ? Endometriosis of pelvic peritoneum 11/21/2012  ? Insomnia 05/27/2012  ? Sarcoid ([place]) 02/01/2012  ? ? ?Past Medical History:  ?Diagnosis Date  ? Endometriosis   ? Fibroid   ? History of COVID-19   ? Hyperlipidemia   ? Iron deficiency anemia   ? per pt  ? Sarcoid   ? ? ?Past Surgical History:  ?Procedure Laterality Date  ? CESAREAN SECTION    ? LAPAROTOMY  2003  ? missing one tube and ovary? Endometriosis  ? NECK SURGERY  01-24-12  ? removed lymph node  ? ? ?Current Outpatient Medications   ?Medication Sig Dispense Refill  ? Ascorbic Acid (VITAMIN C PO) Take by mouth.    ? clotrimazole-betamethasone (LOTRISONE) cream Apply to affected area 2 times daily. 15 g 0  ? eszopiclone (LUNESTA) 2 MG TABS tablet Take 1 tablet (2 mg total) by mouth at bedtime as needed for sleep. Take immediately before bedtime 30 tablet 3  ? Ferrous Sulfate (IRON) 325 (65 Fe) MG TABS Take 2 tablets by mouth daily.    ? levocetirizine (XYZAL) 5 MG tablet Take 1 tablet (5 mg total) by mouth every evening. 30 tablet 2  ? meloxicam (MOBIC) 15 MG tablet Take 1 tablet (15 mg total) by mouth daily. 30 tablet 0  ? tiZANidine (ZANAFLEX) 4 MG tablet Take 1 tablet (4 mg total) by mouth at bedtime. 30 tablet 0  ? UNABLE TO FIND Med Name: pt reports taking black seed oil once per day    ? VITAMIN D, CHOLECALCIFEROL, PO Take by mouth.    ? ?No current facility-administered medications for this visit.  ?  ? ?ALLERGIES: Ceftin [cefuroxime axetil] and Latex ? ?Family History  ?  Problem Relation Age of Onset  ? Asthma Father   ?     childhood-smoker  ? Asthma Sister   ? Asthma Brother   ? Diabetes Paternal Grandmother   ? Breast cancer Maternal Aunt   ? Diabetes Maternal Grandmother   ? Hypertension Maternal Grandmother   ? Endometriosis Cousin   ? ? ?Social History  ? ?Socioeconomic History  ? Marital status: Married  ?  Spouse name: Not on file  ? Number of children: 2  ? Years of education: Not on file  ? Highest education level: Not on file  ?Occupational History  ? Occupation: Art therapist  ?Tobacco Use  ? Smoking status: Never  ? Smokeless tobacco: Never  ?Vaping Use  ? Vaping Use: Never used  ?Substance and Sexual Activity  ? Alcohol use: Not Currently  ? Drug use: Not Currently  ? Sexual activity: Not Currently  ?  Partners: Male  ?  Birth control/protection: None  ?Other Topics Concern  ? Not on file  ?Social History Narrative  ? Not on file  ? ?Social Determinants of Health  ? ?Financial Resource Strain: Not on file  ?Food  Insecurity: Not on file  ?Transportation Needs: Not on file  ?Physical Activity: Not on file  ?Stress: Not on file  ?Social Connections: Not on file  ?Intimate Partner Violence: Not on file  ? ? ?Review of Systems  ?All other systems reviewed and are negative. ? ?PHYSICAL EXAMINATION:   ? ?There were no vitals taken for this visit.    ?General appearance: alert, cooperative and appears stated age ?Neck: no adenopathy, supple, symmetrical, trachea midline and thyroid normal to inspection and palpation ?Abdomen: uterus palpated in the mid, lower abdomen, mildly tender to palpation. Abdomen is otherwise soft, not tender, non distended, no masses,  no organomegaly ? ?Pelvic: External genitalia:  no lesions ?             Urethra:  normal appearing urethra with no masses, tenderness or lesions ?             Bartholins and Skenes: normal    ?             Vagina: normal appearing vagina with normal color and discharge, no lesions ?             Cervix: no cervical motion tenderness and no lesions ?             Bimanual Exam:  Uterus:   14 week sized, mildly tender, irregular ?             Adnexa: no mass, fullness, tenderness ?              ? ?Chaperone was present for exam. ? ?1. Abnormal uterine bleeding (AUB) ?Heavy cycles (not as bad as they used to be), now with BTB ?- TSH ?- Ferritin ?- [place] Sonohysterogram; Future ? ?2. Uterine leiomyoma, unspecified location ?- [place] Sonohysterogram; Future ? ?3. Other fatigue ?- TSH ? ?4. History of anemia ?- Ferritin ?HGB was mildly low last week at 11.6 gm/dl ?She is not currently on iron ? ?5. Screening examination for STD (sexually transmitted disease) ?- SURESWAB CT/NG/T. vaginalis ? ?

## 2021-09-28 ENCOUNTER — Other Ambulatory Visit: Payer: Self-pay

## 2021-09-28 ENCOUNTER — Encounter: Payer: Self-pay | Admitting: Family

## 2021-09-28 DIAGNOSIS — D259 Leiomyoma of uterus, unspecified: Secondary | ICD-10-CM

## 2021-09-28 DIAGNOSIS — N939 Abnormal uterine and vaginal bleeding, unspecified: Secondary | ICD-10-CM

## 2021-09-28 LAB — SURESWAB CT/NG/T. VAGINALIS
C. trachomatis RNA, TMA: NOT DETECTED
N. gonorrhoeae RNA, TMA: NOT DETECTED
Trichomonas vaginalis RNA: NOT DETECTED

## 2021-09-29 ENCOUNTER — Ambulatory Visit: Payer: Medicaid Other | Admitting: Obstetrics and Gynecology

## 2021-10-31 ENCOUNTER — Ambulatory Visit (INDEPENDENT_AMBULATORY_CARE_PROVIDER_SITE_OTHER): Payer: No Typology Code available for payment source | Admitting: Obstetrics and Gynecology

## 2021-10-31 ENCOUNTER — Ambulatory Visit (INDEPENDENT_AMBULATORY_CARE_PROVIDER_SITE_OTHER): Payer: No Typology Code available for payment source

## 2021-10-31 ENCOUNTER — Encounter: Payer: Self-pay | Admitting: Family

## 2021-10-31 ENCOUNTER — Other Ambulatory Visit: Payer: Self-pay | Admitting: Obstetrics and Gynecology

## 2021-10-31 ENCOUNTER — Encounter: Payer: Self-pay | Admitting: Obstetrics and Gynecology

## 2021-10-31 VITALS — BP 110/60 | HR 77 | Wt 173.0 lb

## 2021-10-31 DIAGNOSIS — D259 Leiomyoma of uterus, unspecified: Secondary | ICD-10-CM

## 2021-10-31 DIAGNOSIS — Z862 Personal history of diseases of the blood and blood-forming organs and certain disorders involving the immune mechanism: Secondary | ICD-10-CM

## 2021-10-31 DIAGNOSIS — N939 Abnormal uterine and vaginal bleeding, unspecified: Secondary | ICD-10-CM

## 2021-10-31 NOTE — Progress Notes (Signed)
GYNECOLOGY  VISIT ?  ?HPI: ?46 y.o.   Married Black or [place] American Not Hispanic or Latino  female   ?[id number] with No LMP recorded.   ?here for evaluation of AUB. She has heavy cycles (lighter than they used to be), but had some irregular BTB. The BTB was only during one cycle. She is currently more bothered by the bulk symptoms of her fibroids than the bleeding. She finds the bleeding tolerable.  ?09/27/21 labs ?Negative GC/CT/Trich ?TSH 4.2 ?Ferritin 8 ?CBC from 09/22/21 with hgb of 11.6 ? ?GYNECOLOGIC HISTORY: ?No LMP recorded. ?Contraception:none  ?Menopausal hormone therapy: none  ?       ?OB History   ? ? Gravida  ?3  ? Para  ?2  ? Term  ?2  ? Preterm  ?   ? AB  ?1  ? Living  ?2  ?  ? ? SAB  ?   ? IAB  ?   ? Ectopic  ?   ? Multiple  ?   ? Live Births  ?2  ?   ?  ?  ?    ? ?Patient Active Problem List  ? Diagnosis Date Noted  ? Encounter for general adult medical examination with abnormal findings 09/22/2021  ? Chronic bilateral low back pain with bilateral sciatica 06/05/2021  ? IDA (iron deficiency anemia) 07/26/2020  ? OCD (obsessive compulsive disorder) 09/23/2015  ? Seasonal and perennial allergic rhinitis 03/21/2014  ? Endometriosis of pelvic peritoneum 11/21/2012  ? Insomnia 05/27/2012  ? Sarcoid ([place]) 02/01/2012  ? ? ?Past Medical History:  ?Diagnosis Date  ? Endometriosis   ? Fibroid   ? History of COVID-19   ? Hyperlipidemia   ? Iron deficiency anemia   ? per pt  ? Sarcoid   ? ? ?Past Surgical History:  ?Procedure Laterality Date  ? CESAREAN SECTION    ? LAPAROTOMY  2003  ? missing one tube and ovary? Endometriosis  ? NECK SURGERY  01-24-12  ? removed lymph node  ? ? ?Current Outpatient Medications  ?Medication Sig Dispense Refill  ? Ascorbic Acid (VITAMIN C PO) Take by mouth.    ? clotrimazole-betamethasone (LOTRISONE) cream Apply to affected area 2 times daily. 15 g 0  ? eszopiclone (LUNESTA) 2 MG TABS tablet Take 1 tablet (2 mg total) by mouth at bedtime as needed for sleep. Take immediately before  bedtime (Patient not taking: Reported on 09/27/2021) 30 tablet 3  ? Ferrous Sulfate (IRON) 325 (65 Fe) MG TABS Take 2 tablets by mouth daily.    ? levocetirizine (XYZAL) 5 MG tablet Take 1 tablet (5 mg total) by mouth every evening. 30 tablet 2  ? meloxicam (MOBIC) 15 MG tablet Take 1 tablet (15 mg total) by mouth daily. 30 tablet 0  ? tiZANidine (ZANAFLEX) 4 MG tablet Take 1 tablet (4 mg total) by mouth at bedtime. 30 tablet 0  ? UNABLE TO FIND Med Name: pt reports taking black seed oil once per day    ? VITAMIN D, CHOLECALCIFEROL, PO Take by mouth.    ? ?No current facility-administered medications for this visit.  ?  ? ?ALLERGIES: Ceftin [cefuroxime axetil] and Latex ? ?Family History  ?Problem Relation Age of Onset  ? Asthma Father   ?     childhood-smoker  ? Asthma Sister   ? Asthma Brother   ? Diabetes Paternal Grandmother   ? Breast cancer Maternal Aunt   ? Diabetes Maternal Grandmother   ? Hypertension Maternal Grandmother   ?  Endometriosis Cousin   ? ? ?Social History  ? ?Socioeconomic History  ? Marital status: Married  ?  Spouse name: Not on file  ? Number of children: 2  ? Years of education: Not on file  ? Highest education level: Not on file  ?Occupational History  ? Occupation: Art therapist  ?Tobacco Use  ? Smoking status: Never  ? Smokeless tobacco: Never  ?Vaping Use  ? Vaping Use: Never used  ?Substance and Sexual Activity  ? Alcohol use: Not Currently  ? Drug use: Not Currently  ? Sexual activity: Not Currently  ?  Partners: Male  ?  Birth control/protection: None  ?Other Topics Concern  ? Not on file  ?Social History Narrative  ? Not on file  ? ?Social Determinants of Health  ? ?Financial Resource Strain: Not on file  ?Food Insecurity: Not on file  ?Transportation Needs: Not on file  ?Physical Activity: Not on file  ?Stress: Not on file  ?Social Connections: Not on file  ?Intimate Partner Violence: Not on file  ? ? ?ROS ? ?PHYSICAL EXAMINATION:   ? ?There were no vitals taken for this visit.     ?General appearance: alert, cooperative and appears stated age ? ?Pelvic ultrasound ? ?Indications: AUB, bulk symptoms, fibroid uterus ? ?Findings: ? ?Uterus 13.94 x 8.92 x 8.41 cm, anteverted ?Multiple intramural and subserosal fibroids. Only the largest myomas are measured. Overall size of her uterus is unchanged from her previous ultrasound. ?Fibroids: ?1) 3.71 x 2.94 cm ?2) 2.89 x 1.63 cm ?3) 3.74 x 2.4 cm ? ?Endometrium 5.23 mm symmetrical endometrium, no masses or thickening, no blood flow. ? ?Left ovary Surgically absent ? ?Right ovary 3.37 x 2.57 x 2.12 cm ? ?No free fluid ? ?Impression:  ?Enlarged, fibroid uterus. Overall stable in size. ?Symmetrical endometrium ?Normal right ovary ?Left ovary surgically absent ? ? ?1. Uterine leiomyoma, unspecified location ?Stable in size, bulk symptoms are bothersome to the patient, she is interested in shrinking her fibroids, but doesn't want a hysterectomy. ?-We discussed the sonata again, she has lots of little fibroids and subserosal myomas. ?-We discussed uterine artery embolization, she would like to go for a consultation ? ?2. Abnormal uterine bleeding ?Overall her bleeding is lighter than it was, she has had one episode of intermenstrual bleeding. ?If she has continued episodes of intermenstrual bleeding she will need further evaluation. Today her endometrial stripe is well visualized, is thin without masses or vascularity. No impingement of her fibroids into the endometrium.  ? ?3. History of anemia ?Low iron stores, on iron ? ?In addition to reviewing the ultrasound results with the patient, time was spent in discussing management options. A consultation will be placed.  ?

## 2021-11-02 NOTE — Progress Notes (Signed)
?Sara Nelson D.O. ?[facility] ?[place] ?Phone: [phone] ?Subjective:   ?I, Sara Nelson, am serving as a scribe for Dr. Hulan Saas. ? ?This visit occurred during the SARS-CoV-2 public health emergency.  Safety protocols were in place, including screening questions prior to the visit, additional usage of staff PPE, and extensive cleaning of exam room while observing appropriate contact time as indicated for disinfecting solutions.  ? ?I'm seeing this patient by the request  of:  Hoyt Koch, MD ? ?CC: back pain follow up  ? ?[id number]  ?06/05/2021 ?1. 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  ?  ?Pertinent previous records reviewed include previous office notes, lumbar MRI 2019 ?  ?Follow Up: 4 weeks for reevaluation.  If no improvement or worsening of symptoms could consider repeat epidural versus facet injections.  Otherwise could consider continued PT with intermittent NSAID use for maintenance ? ?Updated 11/06/2021 ?Sara Nelson is a 46 y.o. female coming in with complaint of back pain. Following up from PT. Patient saw Dr. Glennon Mac in November. Went to one session of PT as practice kept cancelling on her. Patient does feel that she is better than she was last year.  ? ?Has had abnormal bleeding as well and seeing gynecology.  ?Ferritin is 8! ? ?  ? ?Past Medical History:  ?Diagnosis Date  ?  Endometriosis   ? Fibroid   ? History of COVID-19   ? Hyperlipidemia   ? Iron deficiency anemia   ? per pt  ? Sarcoid   ? ?Past Surgical History:  ?Procedure Laterality Date  ? CESAREAN SECTION    ? LAPAROTOMY  2003  ? missing one tube and ovary? Endometriosis  ? NECK SURGERY  01-24-12  ? removed lymph node  ? ?Social History  ? ?Socioeconomic History  ? Marital status: Married  ?  Spouse name: Not on file  ? Number of children: 2  ? Years of education: Not on file  ? Highest education level: Not on file  ?Occupational History  ? Occupation: Art therapist  ?Tobacco Use  ? Smoking status: Never  ? Smokeless tobacco: Never  ?Vaping Use  ? Vaping Use: Never used  ?Substance and Sexual Activity  ? Alcohol use: Not Currently  ? Drug use: Not Currently  ? Sexual activity: Not Currently  ?  Partners: Male  ?  Birth control/protection: None  ?Other Topics Concern  ? Not on file  ?Social History Narrative  ? Not on file  ? ?Social Determinants of Health  ? ?Financial Resource Strain: Not on file  ?Food Insecurity: Not on file  ?Transportation Needs: Not on file  ?Physical Activity: Not on file  ?Stress: Not on file  ?Social Connections: Not on file  ? ?Allergies  ?Allergen Reactions  ? Ceftin [Cefuroxime Axetil] Shortness Of Breath  ? Latex Rash and Other (See Comments)  ?  Redish skin  ? ?Family History  ?Problem Relation Age of Onset  ? Asthma  Father   ?     childhood-smoker  ? Asthma Sister   ? Asthma Brother   ? Diabetes Paternal Grandmother   ? Breast cancer Maternal Aunt   ? Diabetes Maternal Grandmother   ? Hypertension Maternal Grandmother   ? Endometriosis Cousin   ? ? ? ? ?Current Outpatient Medications (Respiratory):  ?  levocetirizine (XYZAL) 5 MG tablet, Take 1 tablet (5 mg total) by mouth every evening. ? ?Current Outpatient Medications (Analgesics):  ?  meloxicam (MOBIC) 15 MG tablet, Take 1 tablet (15 mg total) by mouth daily. ? ?Current Outpatient Medications (Hematological):  ?  Ferrous Sulfate (IRON) 325  (65 Fe) MG TABS, Take 2 tablets by mouth daily. ? ?Current Outpatient Medications (Other):  ?  Ascorbic Acid (VITAMIN C PO), Take by mouth. ?  clotrimazole-betamethasone (LOTRISONE) cream, Apply to affected area 2 times daily. ?  eszopiclone (LUNESTA) 2 MG TABS tablet, Take 1 tablet (2 mg total) by mouth at bedtime as needed for sleep. Take immediately before bedtime ?  tiZANidine (ZANAFLEX) 4 MG tablet, Take 1 tablet (4 mg total) by mouth at bedtime. ?  UNABLE TO FIND, Med Name: pt reports taking black seed oil once per day ?  VITAMIN D, CHOLECALCIFEROL, PO, Take by mouth. ? ? ?Reviewed prior external information including notes and imaging from  ?primary care provider ?As well as notes that were available from care everywhere and other healthcare systems. ? ?Past medical history, social, surgical and family history all reviewed in electronic medical record.  No pertanent information unless stated regarding to the chief complaint.  ? ?Review of Systems: ? No headache, visual changes, nausea, vomiting, diarrhea, constipation, dizziness, abdominal pain, skin rash, fevers, chills, night sweats, weight loss, swollen lymph nodes, joint swelling, chest pain, shortness of breath, mood changes. POSITIVE muscle aches, body aches ? ?Objective  ?Blood pressure 118/84, pulse 79, height '5\' 5"'$  (1.651 m), weight 172 lb (78 kg), last menstrual period 10/09/2021, SpO2 97 %. ?  ?General: No apparent distress alert and oriented x3 mood and affect normal, dressed appropriately.  ?HEENT: Pupils equal, extraocular movements intact  ?Respiratory: Patient's speak in full sentences and does not appear short of breath  ?Cardiovascular: No lower extremity edema, non tender, no erythema  ?Gait normal with good balance and coordination.  ?MSK:  low back exam mild loss lordosis.  Patient has negative Corky Sox.  5-5 strength in lower extremities.  Negative straight leg test. ? ?  ?Impression and Recommendations:  ?  ? ?The above documentation has  been reviewed and is accurate and complete Lyndal Pulley, DO ? ? ? ?

## 2021-11-06 ENCOUNTER — Telehealth: Payer: Self-pay | Admitting: Obstetrics and Gynecology

## 2021-11-06 ENCOUNTER — Telehealth: Payer: Self-pay | Admitting: Internal Medicine

## 2021-11-06 ENCOUNTER — Encounter: Payer: Self-pay | Admitting: Obstetrics and Gynecology

## 2021-11-06 ENCOUNTER — Ambulatory Visit (INDEPENDENT_AMBULATORY_CARE_PROVIDER_SITE_OTHER): Payer: No Typology Code available for payment source | Admitting: Family Medicine

## 2021-11-06 ENCOUNTER — Encounter: Payer: Self-pay | Admitting: Family

## 2021-11-06 DIAGNOSIS — G8929 Other chronic pain: Secondary | ICD-10-CM

## 2021-11-06 DIAGNOSIS — N803 Endometriosis of pelvic peritoneum, unspecified: Secondary | ICD-10-CM | POA: Diagnosis not present

## 2021-11-06 DIAGNOSIS — M5441 Lumbago with sciatica, right side: Secondary | ICD-10-CM

## 2021-11-06 DIAGNOSIS — M5442 Lumbago with sciatica, left side: Secondary | ICD-10-CM

## 2021-11-06 NOTE — Assessment & Plan Note (Signed)
Patient is doing better overall.  Discussed with patient at great length about icing regimen and home exercises, patient is doing well and I do not think we need to do anything else drastic.  Continue to stay active.  Discussed which activities to do which wants to avoid.  Increase activity slowly.  Follow-up again in 3 to 4 months otherwise. ?

## 2021-11-06 NOTE — Patient Instructions (Signed)
Iron is key ?'500mg'$  of Vit C with iron and during menstruation take iron and Vit C 2x a day ?Reach out to gynecologist about iron transfusion ?See me in 3-4 months ? ?

## 2021-11-06 NOTE — Telephone Encounter (Signed)
PT visits today with a request for a refill on their medication! Medication is levocetirizine (XYZAL) 5 MG ! If prescription could be made PT would like it sent out to [facility] ! ? ?CB: [phone] ?

## 2021-11-06 NOTE — Assessment & Plan Note (Signed)
Causes patient to have iron deficiency.  Patient's last ferritin was 8.  Discussed need to discuss with patient his gynecologist about the possibility of a another iron transfusion and see if this would help with some of the back pain she is having. ?

## 2021-11-07 ENCOUNTER — Telehealth: Payer: Self-pay

## 2021-11-07 DIAGNOSIS — D259 Leiomyoma of uterus, unspecified: Secondary | ICD-10-CM

## 2021-11-07 MED ORDER — LEVOCETIRIZINE DIHYDROCHLORIDE 5 MG PO TABS: 5.0000 mg | ORAL_TABLET | Freq: Every evening | ORAL | 3 refills | Status: DC

## 2021-11-07 NOTE — Telephone Encounter (Signed)
-----   Message from Salvadore Dom, MD sent at 11/06/2021  3:24 PM EDT ----- ?Please place a referral to interventional radiology for uterine artery embolization.  ?Thanks, ?Sharee Pimple ? ?

## 2021-11-07 NOTE — Telephone Encounter (Signed)
Erroneous encounter

## 2021-11-07 NOTE — Telephone Encounter (Signed)
Referral sent 

## 2021-11-13 NOTE — Telephone Encounter (Signed)
LVM with Interventional radiology to f/u on referral.  ?

## 2021-11-13 NOTE — Telephone Encounter (Signed)
Sara Nelson with Interventional radiology stated she tried calling pt on 11/09/21 and line was busy. She tried again on 11/10/21 and she LVM. States she will try again today ? ?Pt is scheduled for 11/21/21 @ 11.  ?

## 2021-11-21 ENCOUNTER — Other Ambulatory Visit: Payer: No Typology Code available for payment source | Admitting: Obstetrics and Gynecology

## 2021-11-21 ENCOUNTER — Encounter: Payer: Self-pay | Admitting: *Deleted

## 2021-11-21 ENCOUNTER — Ambulatory Visit
Admission: RE | Admit: 2021-11-21 | Discharge: 2021-11-21 | Disposition: A | Discharge status: Home / Self Care | Payer: No Typology Code available for payment source | Source: Ambulatory Visit | Attending: Obstetrics and Gynecology | Admitting: Obstetrics and Gynecology

## 2021-11-21 ENCOUNTER — Encounter: Payer: Self-pay | Admitting: Family

## 2021-11-21 ENCOUNTER — Other Ambulatory Visit: Payer: No Typology Code available for payment source

## 2021-11-21 DIAGNOSIS — D259 Leiomyoma of uterus, unspecified: Secondary | ICD-10-CM

## 2021-11-21 HISTORY — PX: IR RADIOLOGIST EVAL & MGMT: IMG5224

## 2021-11-21 NOTE — Consult Note (Signed)
? ?Chief Complaint: ?Patient was seen in consultation today for abnormal uterine bleeding at the request of Salvadore Dom ? ?Referring Physician(s): ?Salvadore Dom ? ?Patient Status: [facility] - Out-pt ? ?History of Present Illness: ?Sara Nelson is a 46 y.o. female presenting today as a scheduled consultation for symptomatic uterine fibroids. ? ?She complains of 2 of 3 categories of symptoms of fibroids, bleeding and bulk.  She used to have significant pain, but improved after she was on Prednisone for sarcoidosis.  ? ?She tells me that regarding her bleeding symptoms, she has heavy bleeding her whole life.  She required iron supplementation.  She did not tolerate hormonal therapy.  She does not want to a hysterectomy and does not plan on having any more children. ? ?Past Medical History:  ?Diagnosis Date  ? Endometriosis   ? Fibroid   ? History of COVID-19   ? Hyperlipidemia   ? Iron deficiency anemia   ? per pt  ? Sarcoid   ? ? ?Past Surgical History:  ?Procedure Laterality Date  ? CESAREAN SECTION    ? IR RADIOLOGIST EVAL & MGMT  11/21/2021  ? LAPAROTOMY  2003  ? missing one tube and ovary? Endometriosis  ? NECK SURGERY  01-24-12  ? removed lymph node  ? ? ?Allergies: ?Ceftin [cefuroxime axetil] and Latex ? ?Medications: ?Prior to Admission medications   ?Medication Sig Start Date End Date Taking? Authorizing Provider  ?Ascorbic Acid (VITAMIN C PO) Take by mouth.    [provider]  ?clotrimazole-betamethasone (LOTRISONE) cream Apply to affected area 2 times daily. 01/03/21   Pearson Forster, NP  ?eszopiclone (LUNESTA) 2 MG TABS tablet Take 1 tablet (2 mg total) by mouth at bedtime as needed for sleep. Take immediately before bedtime 09/22/21   Hoyt Koch, MD  ?Ferrous Sulfate (IRON) 325 (65 Fe) MG TABS Take 2 tablets by mouth daily.    [provider]  ?levocetirizine (XYZAL) 5 MG tablet Take 1 tablet (5 mg total) by mouth every evening. 11/07/21   Hoyt Koch, MD   ?meloxicam (MOBIC) 15 MG tablet Take 1 tablet (15 mg total) by mouth daily. 06/05/21   Glennon Mac, DO  ?tiZANidine (ZANAFLEX) 4 MG tablet Take 1 tablet (4 mg total) by mouth at bedtime. 04/07/21   Lyndal Pulley, DO  ?UNABLE TO FIND Med Name: pt reports taking black seed oil once per day    [provider]  ?VITAMIN D, CHOLECALCIFEROL, PO Take by mouth.    [provider]  ?  ? ?Family History  ?Problem Relation Age of Onset  ? Asthma Father   ?     childhood-smoker  ? Asthma Sister   ? Asthma Brother   ? Diabetes Paternal Grandmother   ? Breast cancer Maternal Aunt   ? Diabetes Maternal Grandmother   ? Hypertension Maternal Grandmother   ? Endometriosis Cousin   ? ? ?Social History  ? ?Socioeconomic History  ? Marital status: Single  ?  Spouse name: Not on file  ? Number of children: 2  ? Years of education: Not on file  ? Highest education level: Not on file  ?Occupational History  ? Occupation: Art therapist  ?Tobacco Use  ? Smoking status: Never  ? Smokeless tobacco: Never  ?Vaping Use  ? Vaping Use: Never used  ?Substance and Sexual Activity  ? Alcohol use: Not Currently  ? Drug use: Not Currently  ? Sexual activity: Not Currently  ?  Partners: Male  ?  Birth control/protection: None  ?Other Topics Concern  ? Not on file  ?Social History Narrative  ? Not on file  ? ?Social Determinants of Health  ? ?Financial Resource Strain: Not on file  ?Food Insecurity: Not on file  ?Transportation Needs: Not on file  ?Physical Activity: Not on file  ?Stress: Not on file  ?Social Connections: Not on file  ? ?Review of Systems: A 12 point ROS discussed and pertinent positives are indicated in the HPI above.  All other systems are negative. ? ?Review of Systems ? ?Vital Signs: ?BP 116/70 (BP Location: Left Arm)   Pulse 66   SpO2 98%  ? ?Physical Exam ?Constitutional:   ?   Appearance: Normal appearance.  ?Pulmonary:  ?   Effort: Pulmonary effort is normal.  ?Skin: ?   General: Skin is warm and  dry.  ?Neurological:  ?   Mental Status: She is alert and oriented to person, place, and time.  ?Psychiatric:     ?   Mood and Affect: Mood normal.  ? ? ?Imaging: ?IR Radiologist Eval & Mgmt ? ?Result Date: 11/21/2021 ?Please refer to notes tab for details about interventional procedure. (Op Note)  ? ?Labs: ? ?CBC: ?Recent Labs  ?  12/29/20 ?1553 09/22/21 ?1649  ?WBC 6.7 7.8  ?HGB 11.8 11.6*  ?HCT 38.7 35.4  ?PLT 488* 478*  ? ? ?COAGS: ?No results for input(s): INR, APTT in the last 8760 hours. ? ?BMP: ?Recent Labs  ?  12/29/20 ?1553 09/22/21 ?1649  ?NA 137 138  ?K 4.1 4.2  ?CL 105 104  ?CO2 23 27  ?GLUCOSE 90 91  ?BUN 13 13  ?CALCIUM 9.0 9.3  ?CREATININE 0.87 0.82  ? ? ?LIVER FUNCTION TESTS: ?Recent Labs  ?  12/29/20 ?1553 09/22/21 ?1649  ?BILITOT 0.7 0.6  ?AST 14 16  ?ALT 9 7  ?PROT 7.0 6.9  ? ? ?TUMOR MARKERS: ?No results for input(s): AFPTM, CEA, CA199, CHROMGRNA in the last 8760 hours. ? ?Assessment and Plan: ? ?[name] is a 46 year old woman presenting to IR clinic for discussion of symptomatic uterine fibroids.  Her primary symptoms are bleeding and pressure.   ? ?I had a lengthy conversation with her regarding the anatomy, pathology, and treatment options for fibroids.  She was counseled on options for treatment of uterine fibroids including doing nothing, hormonal therapy, myomectomy, hysterectomy, and uterine artery embolization. ? ?Regarding UFE, we had a discussion the efficacy, expectations regarding outcomes/long term efficacy, and risk/benefit.   ? ?I shared with her a summary of the literature for UFE efficacy, which generally is accepted to be adequate symptom relief with good restoration of quality of life at 1 year in 90% or greater of patients for bleeding > pain > bulk symptoms.  I also discussed with her that there is a cited rate of retreatment in ~15-20% of patients in the long term, with overall excellent long term utility of symptom relief and QOL.   ? ?We discussed the expectations  not just for the treatment day and post treatment admission to 23 hour observation, but the first 3 months, 6 months - 12 months, and our follow up.  I recommended that she take 1-2 weeks off of work to allow her body adequate recovery time.  ? ?Regarding risks, specific risks discussed include: post-embolization syndrome, bleeding, infection, contrast reaction, kidney/artery injury, need for further surgery/procedure, including hysterectomy, need for hospitalization, cardiopulmonary collapse, death.   ? ?We discussed an approximate 7% chance of  early onset menopause, mostly in women >35 years of age, with a less than 1% risk for women less than 58 years of age.  We discussed the need to evaluate the ovarian arteries and in some cases the need to treat the fibroids via the ovarian arteries which can lead to a higher risk of early menopause.   ? ?Regarding post-embolization syndrome, I did let her know that this is essentially expected, with a typical prodromal syndrome lasting 4-7 days typically, and usually treated with medication/support such as hydration, rest, analgesics, PO nausea medications, and stool softeners.  ? ?We also discussed the need for MRI and possibility that she may be excluded by findings, or possibly requiring a follow up conversation if we feel that Adenomyosis is present/contributing. ? ?After discussing, she would like to consider this option and get back to [place] if she wants to proceed.  ? ?Plan:  ?- If she would like to proceed with [place], obtain MRI to evaluate uterus/fibroids ? ?Thank you for this interesting consult.  I greatly enjoyed meeting Allante Beane and look forward to participating in their care.  A copy of this report was sent to the requesting provider on this date. ? ?Electronically Signed: ?Paula Libra Shayan Bramhall, MD ?11/21/2021, 3:13 PM ? ? ?I spent a total of  30 Minutes  in face to face in clinical consultation, greater than 50% of which was counseling/coordinating care for Uterine  fibroids. ? ?

## 2021-12-04 ENCOUNTER — Telehealth: Payer: Self-pay

## 2021-12-04 NOTE — Telephone Encounter (Signed)
PA started. ? ? ? ? ?Key: [id number] ?

## 2022-01-23 ENCOUNTER — Encounter: Payer: Self-pay | Admitting: Family

## 2022-02-09 NOTE — Progress Notes (Signed)
[place] Phone: [phone] Subjective:   Sara Nelson, am serving as a scribe for Dr. Hulan Saas.   I'm seeing this patient by the request  of:  Hoyt Koch, MD  CC: Back pain follow-up,  JIR:[id number]  11/06/2021 Causes patient to have iron deficiency.  Patient's last ferritin was 8.  Discussed need to discuss with patient his gynecologist about the possibility of a another iron transfusion and see if this would help with some of the back pain she is having.  Patient is doing better overall.  Discussed with patient at great length about icing regimen and home exercises, patient is doing well and I do not think we need to do anything else drastic.  Continue to stay active.  Discussed which activities to do which wants to avoid.  Increase activity slowly.  Follow-up again in 3 to 4 months otherwise.  Updated 02/12/2022 Sara Nelson is a 46 y.o. female coming in with complaint of back pain. Back pain has improved if she is sitting for a long period of time. Has been doubling up on iron.   Patient states that she is having L knee pain. Did not fall rollerskating but feels like pain started after that activity. Pain deep in her joint.          Past Medical History:  Diagnosis Date   Endometriosis    Fibroid    History of COVID-19    Hyperlipidemia    Iron deficiency anemia    per pt   Sarcoid    Past Surgical History:  Procedure Laterality Date   CESAREAN SECTION     IR RADIOLOGIST EVAL & MGMT  11/21/2021   LAPAROTOMY  2003   missing one tube and ovary? Endometriosis   NECK SURGERY  01-24-12   removed lymph node   Social History   Socioeconomic History   Marital status: Single    Spouse name: Not on file   Number of children: 2   Years of education: Not on file   Highest education level: Not on file  Occupational History   Occupation: Art therapist  Tobacco Use    Smoking status: Never   Smokeless tobacco: Never  Vaping Use   Vaping Use: Never used  Substance and Sexual Activity   Alcohol use: Not Currently   Drug use: Not Currently   Sexual activity: Not Currently    Partners: Male    Birth control/protection: None  Other Topics Concern   Not on file  Social History Narrative   Not on file   Social Determinants of Health   Financial Resource Strain: Not on file  Food Insecurity: Not on file  Transportation Needs: Not on file  Physical Activity: Not on file  Stress: Not on file  Social Connections: Not on file   Allergies  Allergen Reactions   Ceftin [Cefuroxime Axetil] Shortness Of Breath   Latex Rash and Other (See Comments)    Redish skin   Family History  Problem Relation Age of Onset   Asthma Father        childhood-smoker   Asthma Sister    Asthma Brother    Diabetes Paternal Grandmother    Breast cancer Maternal Aunt    Diabetes Maternal Grandmother    Hypertension Maternal Grandmother    Endometriosis Cousin       Current Outpatient Medications (Respiratory):    levocetirizine (XYZAL) 5 MG tablet, Take 1 tablet (  5 mg total) by mouth every evening.  Current Outpatient Medications (Analgesics):    meloxicam (MOBIC) 15 MG tablet, Take 1 tablet (15 mg total) by mouth daily.  Current Outpatient Medications (Hematological):    Ferrous Sulfate (IRON) 325 (65 Fe) MG TABS, Take 2 tablets by mouth daily.  Current Outpatient Medications (Other):    Ascorbic Acid (VITAMIN C PO), Take by mouth.   clotrimazole-betamethasone (LOTRISONE) cream, Apply to affected area 2 times daily.   eszopiclone (LUNESTA) 2 MG TABS tablet, Take 1 tablet (2 mg total) by mouth at bedtime as needed for sleep. Take immediately before bedtime   tiZANidine (ZANAFLEX) 4 MG tablet, Take 1 tablet (4 mg total) by mouth at bedtime.   UNABLE TO FIND, Med Name: pt reports taking black seed oil once per day   VITAMIN D, CHOLECALCIFEROL, PO, Take by  mouth.   Reviewed prior external information including notes and imaging from  primary care provider As well as notes that were available from care everywhere and other healthcare systems.  Past medical history, social, surgical and family history all reviewed in electronic medical record.  Nelson pertanent information unless stated regarding to the chief complaint.   Review of Systems:  Nelson headache, visual changes, nausea, vomiting, diarrhea, constipation, dizziness, abdominal pain, skin rash, fevers, chills, night sweats, weight loss, swollen lymph nodes, body aches, joint swelling, chest pain, shortness of breath, mood changes. POSITIVE muscle aches  Objective  Blood pressure 100/60, height '5\' 5"'$  (1.651 m).   General: Nelson apparent distress alert and oriented x3 mood and affect normal, dressed appropriately.  HEENT: Pupils equal, extraocular movements intact  Respiratory: Patient's speak in full sentences and does not appear short of breath  Cardiovascular: Nelson lower extremity edema, non tender, Nelson erythema  Low back exam does have some mild loss of lordosis.  Patient noted sitting much more comfortable. Left knee exam does show some lateral tracking noted.  Does have lacking the last 5 degrees of flexion.  Crepitus noted.  Positive grind test  97110; 15 additional minutes spent for Therapeutic exercises as stated in above notes.  This included exercises focusing on stretching, strengthening, with significant focus on eccentric aspects.   Long term goals include an improvement in range of motion, strength, endurance as well as avoiding reinjury. Patient's frequency would include in 1-2 times a day, 3-5 times a week for a duration of 6-12 weeks. Reviewed anatomy using anatomical model and how PFS occurs.  Given rehab exercises handout for VMO, hip abductors, core, entire kinetic chain including proprioception exercises.  Could benefit from PT, regular exercise, upright biking, and a PFS knee  brace to assist with tracking abnormalities.  Proper technique shown and discussed handout in great detail with ATC.  All questions were discussed and answered.       Impression and Recommendations:     The above documentation has been reviewed and is accurate and complete Lyndal Pulley, DO

## 2022-02-12 ENCOUNTER — Encounter: Payer: Self-pay | Admitting: Family Medicine

## 2022-02-12 ENCOUNTER — Ambulatory Visit (INDEPENDENT_AMBULATORY_CARE_PROVIDER_SITE_OTHER): Payer: No Typology Code available for payment source

## 2022-02-12 ENCOUNTER — Ambulatory Visit (INDEPENDENT_AMBULATORY_CARE_PROVIDER_SITE_OTHER): Payer: No Typology Code available for payment source | Admitting: Family Medicine

## 2022-02-12 VITALS — BP 100/60 | Ht 65.0 in

## 2022-02-12 DIAGNOSIS — G8929 Other chronic pain: Secondary | ICD-10-CM

## 2022-02-12 DIAGNOSIS — M1712 Unilateral primary osteoarthritis, left knee: Secondary | ICD-10-CM

## 2022-02-12 DIAGNOSIS — M25562 Pain in left knee: Secondary | ICD-10-CM

## 2022-02-12 DIAGNOSIS — M5441 Lumbago with sciatica, right side: Secondary | ICD-10-CM | POA: Diagnosis not present

## 2022-02-12 DIAGNOSIS — M5442 Lumbago with sciatica, left side: Secondary | ICD-10-CM | POA: Diagnosis not present

## 2022-02-12 NOTE — Assessment & Plan Note (Signed)
Patient has noticed significant improvement with the iron supplementation.  Patient is increasing her activity as we see and did have a knee injury but should do relatively well overall.  Follow-up with me again in 6 to 8 weeks otherwise.

## 2022-02-12 NOTE — Assessment & Plan Note (Signed)
Mild overall.  Discussed with patient about a Tru pull lite brace, we discussed a home exercises, avoiding certain activities and icing protocol.  Follow-up with me again in 6 to 8 weeks and if worsening pain consider formal physical therapy or injections.

## 2022-02-12 NOTE — Patient Instructions (Signed)
PF exercises L Tru pull lite  Ice after activity See me again in 7-8 weeks

## 2022-02-22 ENCOUNTER — Encounter: Payer: Self-pay | Admitting: Family

## 2022-04-03 ENCOUNTER — Encounter: Payer: Self-pay | Admitting: Internal Medicine

## 2022-04-06 NOTE — Progress Notes (Unsigned)
[place] [address] [place] Phone: [phone] Subjective:   [name], am serving as a scribe for Dr. Hulan Saas.  I'm seeing this patient by the request  of:  Hoyt Koch, MD  CC: knee and back pain follow up   VPX:[id number]  02/12/2022 Mild overall.  Discussed with patient about a Tru pull lite brace, we discussed a home exercises, avoiding certain activities and icing protocol.  Follow-up with me again in 6 to 8 weeks and if worsening pain consider formal physical therapy or injections.  Patient has noticed significant improvement with the iron supplementation.  Patient is increasing her activity as we see and did have a knee injury but should do relatively well overall.  Follow-up with me again in 6 to 8 weeks otherwise.  Updated 04/09/2022 Sara Nelson is a 46 y.o. female coming in with complaint of L knee and LBP       Past Medical History:  Diagnosis Date   Endometriosis    Fibroid    History of COVID-19    Hyperlipidemia    Iron deficiency anemia    per pt   Sarcoid    Past Surgical History:  Procedure Laterality Date   CESAREAN SECTION     IR RADIOLOGIST EVAL & MGMT  11/21/2021   LAPAROTOMY  2003   missing one tube and ovary? Endometriosis   NECK SURGERY  01-24-12   removed lymph node   Social History   Socioeconomic History   Marital status: Single    Spouse name: Not on file   Number of children: 2   Years of education: Not on file   Highest education level: Not on file  Occupational History   Occupation: Art therapist  Tobacco Use   Smoking status: Never   Smokeless tobacco: Never  Vaping Use   Vaping Use: Never used  Substance and Sexual Activity   Alcohol use: Not Currently   Drug use: Not Currently   Sexual activity: Not Currently    Partners: Male    Birth control/protection: None  Other Topics Concern   Not on file  Social History Narrative   Not on file    Social Determinants of Health   Financial Resource Strain: Not on file  Food Insecurity: Not on file  Transportation Needs: Not on file  Physical Activity: Not on file  Stress: Not on file  Social Connections: Not on file   Allergies  Allergen Reactions   Ceftin [Cefuroxime Axetil] Shortness Of Breath   Latex Rash and Other (See Comments)    Redish skin   Family History  Problem Relation Age of Onset   Asthma Father        childhood-smoker   Asthma Sister    Asthma Brother    Diabetes Paternal Grandmother    Breast cancer Maternal Aunt    Diabetes Maternal Grandmother    Hypertension Maternal Grandmother    Endometriosis Cousin       Current Outpatient Medications (Respiratory):    levocetirizine (XYZAL) 5 MG tablet, Take 1 tablet (5 mg total) by mouth every evening.  Current Outpatient Medications (Analgesics):    meloxicam (MOBIC) 15 MG tablet, Take 1 tablet (15 mg total) by mouth daily.  Current Outpatient Medications (Hematological):    Ferrous Sulfate (IRON) 325 (65 Fe) MG TABS, Take 2 tablets by mouth daily.  Current Outpatient Medications (Other):    Ascorbic Acid (VITAMIN C PO), Take by mouth.  clotrimazole-betamethasone (LOTRISONE) cream, Apply to affected area 2 times daily.   eszopiclone (LUNESTA) 2 MG TABS tablet, Take 1 tablet (2 mg total) by mouth at bedtime as needed for sleep. Take immediately before bedtime   tiZANidine (ZANAFLEX) 4 MG tablet, Take 1 tablet (4 mg total) by mouth at bedtime.   UNABLE TO FIND, Med Name: pt reports taking black seed oil once per day   VITAMIN D, CHOLECALCIFEROL, PO, Take by mouth.   Reviewed prior external information including notes and imaging from  primary care provider As well as notes that were available from care everywhere and other healthcare systems.  Past medical history, social, surgical and family history all reviewed in electronic medical record.  No pertanent information unless stated regarding to  the chief complaint.   Review of Systems:  No headache, visual changes, nausea, vomiting, diarrhea, constipation, dizziness, abdominal pain, skin rash, fevers, chills, night sweats, weight loss, swollen lymph nodes, body aches, joint swelling, chest pain, shortness of breath, mood changes. POSITIVE muscle aches  Objective  Height '5\' 5"'$  (1.651 m).   General: No apparent distress alert and oriented x3 mood and affect normal, dressed appropriately.  HEENT: Pupils equal, extraocular movements intact  Respiratory: Patient's speak in full sentences and does not appear short of breath  Cardiovascular: No lower extremity edema, non tender, no erythema  Right shoulder does have positive impingement with Neer and Hawkins test.  Positive crossover with mild pain over the acromioclavicular joint.  Tightness in the medial aspect of the parascapular region with mild scapular dyskinesis noted.  Foot exam does have some breakdown of the transverse arch noted.  Bunion and bunionette formation noted.  Fibular deviation of the large toe noted.  97110; 15 additional minutes spent for Therapeutic exercises as stated in above notes.  This included exercises focusing on stretching, strengthening, with significant focus on eccentric aspects.   Long term goals include an improvement in range of motion, strength, endurance as well as avoiding reinjury. Patient's frequency would include in 1-2 times a day, 3-5 times a week for a duration of 6-12 weeks.Shoulder Exercises that included:  Basic scapular stabilization to include adduction and depression of scapula Scaption, focusing on proper movement and good control Internal and External rotation utilizing a theraband, with elbow tucked at side entire time Rows with theraband    Proper technique shown and discussed handout in great detail with ATC.  All questions were discussed and answered.     Impression and Recommendations:     The above documentation has been  reviewed and is accurate and complete Lyndal Pulley, DO

## 2022-04-09 ENCOUNTER — Ambulatory Visit (INDEPENDENT_AMBULATORY_CARE_PROVIDER_SITE_OTHER): Payer: No Typology Code available for payment source | Admitting: Family Medicine

## 2022-04-09 DIAGNOSIS — M7551 Bursitis of right shoulder: Secondary | ICD-10-CM | POA: Diagnosis not present

## 2022-04-09 DIAGNOSIS — M216X1 Other acquired deformities of right foot: Secondary | ICD-10-CM | POA: Diagnosis not present

## 2022-04-09 NOTE — Patient Instructions (Signed)
[facility] [employer] Do prescribed exercises at least 3x a week

## 2022-04-09 NOTE — Assessment & Plan Note (Signed)
Discussed HEP  Discussed posture and ergonomics.  Discussed which activities to do which ones to avoid. Eccentric exercises given for the scapula.  Has muscle relaxers if needed.  Follow-up again in 6 to 8 weeks

## 2022-04-09 NOTE — Assessment & Plan Note (Signed)
Worse on the right side.  Patient does have bunion and bunionette formation with fibular deviation noted.  Discussed with patient about icing regimen and home exercises.  Which activities to do and which ones to avoid.  Discussed avoiding being barefoot, proper shoes, over-the-counter orthotics.  Follow-up again in 6 to 8 weeks.  Worsening pain may need to consider custom but hoping will do well

## 2022-06-13 ENCOUNTER — Ambulatory Visit: Payer: Medicaid Other | Admitting: Family Medicine

## 2022-06-27 NOTE — Progress Notes (Unsigned)
[name] Phone: [phone] Subjective:    I'm seeing this patient by the request  of:  Hoyt Koch, MD  CC: Multiple complaints  NAT:[id number]  04/09/2022 Worse on the right side.  Patient does have bunion and bunionette formation with fibular deviation noted.  Discussed with patient about icing regimen and home exercises.  Which activities to do and which ones to avoid.  Discussed avoiding being barefoot, proper shoes, over-the-counter orthotics.  Follow-up again in 6 to 8 weeks.  Worsening pain may need to consider custom but hoping will do well       Update 06/28/2022 Sara Nelson is a 46 y.o. female coming in with complaint of R foot pain. Patient also seen for L knee and LBP. Patient states right foot has been better, left knee has no issues, wears brace as needed. Shoulder is doing better when doing the band exercises but has some bicep soreness with some forearm tenderness then it all went away. Patient has noticed the pain coming back right above bicep on the right arm. No back pain, but has a lump mid back left side of spine that feels like a knot that she is worried about.     Past Medical History:  Diagnosis Date   Endometriosis    Fibroid    History of COVID-19    Hyperlipidemia    Iron deficiency anemia    per pt   Sarcoid    Past Surgical History:  Procedure Laterality Date   CESAREAN SECTION     IR RADIOLOGIST EVAL & MGMT  11/21/2021   LAPAROTOMY  2003   missing one tube and ovary? Endometriosis   NECK SURGERY  01-24-12   removed lymph node   Social History   Socioeconomic History   Marital status: Single    Spouse name: Not on file   Number of children: 2   Years of education: Not on file   Highest education level: Not on file  Occupational History   Occupation: Art therapist  Tobacco Use   Smoking status: Never   Smokeless tobacco: Never  Vaping Use   Vaping  Use: Never used  Substance and Sexual Activity   Alcohol use: Not Currently   Drug use: Not Currently   Sexual activity: Not Currently    Partners: Male    Birth control/protection: None  Other Topics Concern   Not on file  Social History Narrative   Not on file   Social Determinants of Health   Financial Resource Strain: Not on file  Food Insecurity: Not on file  Transportation Needs: Not on file  Physical Activity: Not on file  Stress: Not on file  Social Connections: Not on file   Allergies  Allergen Reactions   Ceftin [Cefuroxime Axetil] Shortness Of Breath   Latex Rash and Other (See Comments)    Redish skin   Family History  Problem Relation Age of Onset   Asthma Father        childhood-smoker   Asthma Sister    Asthma Brother    Diabetes Paternal Grandmother    Breast cancer Maternal Aunt    Diabetes Maternal Grandmother    Hypertension Maternal Grandmother    Endometriosis Cousin       Current Outpatient Medications (Respiratory):    levocetirizine (XYZAL) 5 MG tablet, Take 1 tablet (5 mg total) by mouth every evening.  Current Outpatient Medications (Analgesics):    meloxicam (MOBIC)  15 MG tablet, Take 1 tablet (15 mg total) by mouth daily.  Current Outpatient Medications (Hematological):    Ferrous Sulfate (IRON) 325 (65 Fe) MG TABS, Take 2 tablets by mouth daily.  Current Outpatient Medications (Other):    Ascorbic Acid (VITAMIN C PO), Take by mouth.   eszopiclone (LUNESTA) 2 MG TABS tablet, Take 1 tablet (2 mg total) by mouth at bedtime as needed for sleep. Take immediately before bedtime   UNABLE TO FIND, Med Name: pt reports taking black seed oil once per day   VITAMIN D, CHOLECALCIFEROL, PO, Take by mouth.   clotrimazole-betamethasone (LOTRISONE) cream, Apply to affected area 2 times daily.   tiZANidine (ZANAFLEX) 4 MG tablet, Take 1 tablet (4 mg total) by mouth at bedtime.   Reviewed prior external information including notes and imaging  from  primary care provider As well as notes that were available from care everywhere and other healthcare systems.  Past medical history, social, surgical and family history all reviewed in electronic medical record.  No pertanent information unless stated regarding to the chief complaint.   Review of Systems:  No headache, visual changes, nausea, vomiting, diarrhea, constipation, dizziness, abdominal pain, skin rash, fevers, chills, night sweats, weight loss, swollen lymph nodes, body aches, joint swelling, chest pain, shortness of breath, mood changes. POSITIVE muscle aches  Objective  Blood pressure 110/68, pulse 89, height '5\' 5"'$  (1.651 m), weight 174 lb (78.9 kg), SpO2 98 %.   General: No apparent distress alert and oriented x3 mood and affect normal, dressed appropriately.  HEENT: Pupils equal, extraocular movements intact  Respiratory: Patient's speak in full sentences and does not appear short of breath  Cardiovascular: No lower extremity edema, non tender, no erythema  Patient is back still has some loss of lordosis.  Some tenderness to palpation in the paraspinal musculature of the lumbar spine.  Patient does have what appears to be a fairly taut mass noted in the area.  Does seem to be freely movable though at the moment.  Is tender to palpation.  Seems to be in the soft tissue and not associated with the musculature.  Right shoulder does have a positive speeds and Jurgenson sign.  Mild positive impingement with Hawkins but not Neer's   [id number]; 15 additional minutes spent for Therapeutic exercises as stated in above notes.  This included exercises focusing on stretching, strengthening, with significant focus on eccentric aspects.   Long term goals include an improvement in range of motion, strength, endurance as well as avoiding reinjury. Patient's frequency would include in 1-2 times a day, 3-5 times a week for a duration of 6-12 weeks. Shoulder Exercises that included:  Basic  scapular stabilization to include adduction and depression of scapula Scaption, focusing on proper movement and good control Internal and External rotation utilizing a theraband, with elbow tucked at side entire time Rows with theraband    Proper technique shown and discussed handout in great detail with ATC.  All questions were discussed and answered.     Impression and Recommendations:     The above documentation has been reviewed and is accurate and complete Lyndal Pulley, DO

## 2022-06-28 ENCOUNTER — Ambulatory Visit (INDEPENDENT_AMBULATORY_CARE_PROVIDER_SITE_OTHER): Payer: No Typology Code available for payment source | Admitting: Family Medicine

## 2022-06-28 ENCOUNTER — Encounter: Payer: Self-pay | Admitting: Family Medicine

## 2022-06-28 VITALS — BP 110/68 | HR 89 | Ht 65.0 in | Wt 174.0 lb

## 2022-06-28 DIAGNOSIS — M216X1 Other acquired deformities of right foot: Secondary | ICD-10-CM | POA: Diagnosis not present

## 2022-06-28 DIAGNOSIS — D171 Benign lipomatous neoplasm of skin and subcutaneous tissue of trunk: Secondary | ICD-10-CM

## 2022-06-28 DIAGNOSIS — M5442 Lumbago with sciatica, left side: Secondary | ICD-10-CM | POA: Diagnosis not present

## 2022-06-28 DIAGNOSIS — M7551 Bursitis of right shoulder: Secondary | ICD-10-CM | POA: Diagnosis not present

## 2022-06-28 DIAGNOSIS — M5441 Lumbago with sciatica, right side: Secondary | ICD-10-CM

## 2022-06-28 DIAGNOSIS — G8929 Other chronic pain: Secondary | ICD-10-CM

## 2022-06-28 NOTE — Assessment & Plan Note (Signed)
Stable at the moment.  Discussed posture and ergonomics.  Discussed which activities to do and which ones to avoid.  Follow-up again in 6 to 8 weeks

## 2022-06-28 NOTE — Assessment & Plan Note (Signed)
Discussed proper shoes and what will be beneficial.

## 2022-06-28 NOTE — Patient Instructions (Signed)
Good to see you [employer]  Bicep tendonitis exercise  Arm compression sleeve to wear at work  Try to avoid full supination  [employer] or [employer]  Follow up in 6 weeks

## 2022-06-28 NOTE — Assessment & Plan Note (Signed)
Patient now is having signs and symptoms that is consistent with more of a bicep tendinitis.  Discussed an arm compression sleeve which I think will be beneficial.  Discussed which activities to avoid such as full supination with lifting.  We discussed monitoring some of the exercises.  Worsening pain will consider injection if necessary.  Follow-up again in 6 to 8 weeks

## 2022-06-28 NOTE — Assessment & Plan Note (Signed)
Patient does have what appears to be more of a lipoma of the lower back.  We discussed with patient that it does seem to be freely movable and tender at this time.  We discussed if it increases in size we can consider monitoring.  Patient does have a history of sarcoid and could be more of a nodule but think it is unlikely.  Patient knows if it worsens though that we will seek medical attention.  Patient would like to just monitor for now.  Follow-up with me again in 6 to 8 weeks

## 2022-07-06 ENCOUNTER — Encounter: Payer: Self-pay | Admitting: Family Medicine

## 2022-07-06 ENCOUNTER — Ambulatory Visit (INDEPENDENT_AMBULATORY_CARE_PROVIDER_SITE_OTHER): Payer: Commercial Managed Care - PPO | Admitting: Family Medicine

## 2022-07-06 VITALS — BP 118/84 | HR 79 | Temp 97.6°F | Ht 65.0 in | Wt 177.2 lb

## 2022-07-06 DIAGNOSIS — G4709 Other insomnia: Secondary | ICD-10-CM

## 2022-07-06 DIAGNOSIS — R7302 Impaired glucose tolerance (oral): Secondary | ICD-10-CM

## 2022-07-06 DIAGNOSIS — D869 Sarcoidosis, unspecified: Secondary | ICD-10-CM | POA: Diagnosis not present

## 2022-07-06 DIAGNOSIS — D508 Other iron deficiency anemias: Secondary | ICD-10-CM | POA: Diagnosis not present

## 2022-07-06 DIAGNOSIS — Z7689 Persons encountering health services in other specified circumstances: Secondary | ICD-10-CM

## 2022-07-06 MED ORDER — TRAZODONE HCL 50 MG PO TABS: 25.0000 mg | ORAL_TABLET | Freq: Every evening | ORAL | 3 refills | Status: DC | PRN

## 2022-07-06 NOTE — Progress Notes (Signed)
New Patient Office Visit  Subjective    Patient ID: Sara Nelson, female    DOB: 14-Aug-1975  Age: 46 y.o. MRN: [id number]  CC:  Chief Complaint  Patient presents with   New Patient (Initial Visit)    Patient in office to est PCP.    Insomnia    Patient requesitng rx rf of Lunesta. States can not fall asleep with out  it ( or something doctor would prefer) .    requesting blood sugar check    Paient states that she has been eating a lot of ice cream and feels that she has had a few times of a "sugar coma" at home.    abnormal menstrual bleeding    Patient c/o excessive menstruation bleeding soaking through pads frequently.  Also has chronic low iron levels.     HPI Sara Nelson presents to establish care She is from [facility].  Mother is in [place]. Sisters and brothers are in [place]. Been in [place] since 2002.  Pt is on Iron tabs 1-2 times a day with vitamin C. She has hx of fibroids and AMB. She has postponed surgery due to finances. She has had iron transfusions x 2.  She would like diabetes check. She is on Lunesta for insomnia. She reports her mind still races with OCD thoughts and hard for her to get to sleep.  Pt has hx of Sarcoidosis in 2013. She hasn't seen her pulmonologist. She was on prednisone but stopped due to swelling and fluid retention. She isn't a smoker but she says she went back and it was resolved with cannibinoid treatment that she did on her own after research. She would like to get referred back to [facility] for maintenance.  Pt reports she has OCD organizational. She says she has gotten to the point where she doesn't have to organize her spices by color or alphabetical order.    Outpatient Encounter Medications as of 07/06/2022  Medication Sig   Ascorbic Acid (VITAMIN C PO) Take by mouth.   Ferrous Sulfate (IRON) 325 (65 Fe) MG TABS Take 2 tablets by mouth daily.   levocetirizine (XYZAL) 5 MG tablet Take 1 tablet (5 mg total) by mouth every evening.    SODIUM FLUORIDE 5000 PPM 1.1 % PSTE Take by mouth.   traZODone (DESYREL) 50 MG tablet Take 0.5-1 tablets (25-50 mg total) by mouth at bedtime as needed for sleep.   UNABLE TO FIND Med Name: pt reports taking black seed oil once per day   [DISCONTINUED] eszopiclone (LUNESTA) 2 MG TABS tablet Take 1 tablet (2 mg total) by mouth at bedtime as needed for sleep. Take immediately before bedtime   VITAMIN D, CHOLECALCIFEROL, PO Take by mouth. (Patient not taking: Reported on 07/06/2022)   [DISCONTINUED] clotrimazole-betamethasone (LOTRISONE) cream Apply to affected area 2 times daily.   [DISCONTINUED] meloxicam (MOBIC) 15 MG tablet Take 1 tablet (15 mg total) by mouth daily.   [DISCONTINUED] tiZANidine (ZANAFLEX) 4 MG tablet Take 1 tablet (4 mg total) by mouth at bedtime.   No facility-administered encounter medications on file as of 07/06/2022.    Past Medical History:  Diagnosis Date   Endometriosis    Fibroid    History of COVID-19    Hyperlipidemia    Iron deficiency anemia    per pt   Sarcoid     Past Surgical History:  Procedure Laterality Date   CESAREAN SECTION     IR RADIOLOGIST EVAL & MGMT  11/21/2021  LAPAROTOMY  2003   missing one tube and ovary? Endometriosis   NECK SURGERY  01/24/2012   removed lymph node   TUBAL LIGATION      Family History  Problem Relation Age of Onset   Asthma Father        childhood-smoker   Asthma Sister    Asthma Brother    Breast cancer Maternal Aunt    Diabetes Maternal Grandmother    Hypertension Maternal Grandmother    Diabetes Paternal Grandmother    Endometriosis Cousin    Cancer Cousin     Social History   Socioeconomic History   Marital status: Single    Spouse name: Not on file   Number of children: 2   Years of education: Not on file   Highest education level: Not on file  Occupational History   Occupation: Art therapist  Tobacco Use   Smoking status: Never   Smokeless tobacco: Never  Vaping Use   Vaping Use:  Never used  Substance and Sexual Activity   Alcohol use: Never   Drug use: Never   Sexual activity: Not Currently    Partners: Male    Birth control/protection: None, Abstinence  Other Topics Concern   Not on file  Social History Narrative   Pt reports mother raised as christian and father Gypsy Dec[employer] witness.    She is ok with vaccine if it makes sense.   Never had the flu vaccine, her children never had flu vaccine.   She is Covid vaccine exempt at work. She works at [employer] in [place].    She has a cat Nipsey because she was a nibbler.    Social Determinants of Health   Financial Resource Strain: Not on file  Food Insecurity: Not on file  Transportation Needs: Not on file  Physical Activity: Not on file  Stress: Not on file  Social Connections: Not on file  Intimate Partner Violence: Not on file    Review of Systems  Psychiatric/Behavioral:  The patient has insomnia.   All other systems reviewed and are negative.       Objective    BP 118/84   Pulse 79   Temp 97.6 F (36.4 C)   Ht '5\' 5"'$  (1.651 m)   Wt 177 lb 3 oz (80.4 kg)   SpO2 100%   BMI 29.49 kg/m   Physical Exam Vitals reviewed.  Constitutional:      Appearance: Normal appearance. She is normal weight.  HENT:     Head: Normocephalic.     Right Ear: Tympanic membrane, ear canal and external ear normal.     Left Ear: Tympanic membrane, ear canal and external ear normal.     Nose: Nose normal.     Mouth/Throat:     Mouth: Mucous membranes are moist.     Pharynx: Oropharynx is clear.  Cardiovascular:     Rate and Rhythm: Normal rate and regular rhythm.     Pulses: Normal pulses.     Heart sounds: Normal heart sounds.  Pulmonary:     Effort: Pulmonary effort is normal.     Breath sounds: Normal breath sounds.  Abdominal:     General: Abdomen is flat. Bowel sounds are normal.     Palpations: Abdomen is soft.  Skin:    General: Skin is warm and dry.     Capillary Refill: Capillary refill  takes less than 2 seconds.  Neurological:     General: No focal deficit present.  Mental Status: She is alert and oriented to person, place, and time. Mental status is at baseline.  Psychiatric:        Mood and Affect: Mood normal.        Behavior: Behavior normal.        Thought Content: Thought content normal.        Judgment: Judgment normal.        Assessment & Plan:   Problem List Items Addressed This Visit       Other   Sarcoid ([place])   Relevant Orders   Ambulatory referral to Pulmonology   Insomnia   Relevant Medications   traZODone (DESYREL) 50 MG tablet   IDA (iron deficiency anemia)   Other Visit Diagnoses     Encounter to establish care with new doctor    -  Primary   Impaired glucose tolerance       Relevant Orders   CBC with Differential/Platelet   Hemoglobin A1c      Screening iron levels due to AMB. To continue to follow up with OB/GYN for further treatment options as surgical intervention has been postponed at this time, pt preference. Screening diabetes today. Other labs stable and reviewed from 08/2021 Refer to [facility] for continued maintenance of Sarcoidosis Trial of Trazodone. Stopped Lunesta. Return in about 3 months (around 10/05/2022) for Insomnia.   Leeanne Rio, MD

## 2022-07-07 LAB — CBC WITH DIFFERENTIAL/PLATELET
Basophils Absolute: 0.1 10*3/uL (ref 0.0–0.2)
Basos: 1 %
EOS (ABSOLUTE): 0.3 10*3/uL (ref 0.0–0.4)
Eos: 4 %
Hematocrit: 36.7 % (ref 34.0–46.6)
Hemoglobin: 11.8 g/dL (ref 11.1–15.9)
Immature Grans (Abs): 0 10*3/uL (ref 0.0–0.1)
Immature Granulocytes: 0 %
Lymphocytes Absolute: 2.2 10*3/uL (ref 0.7–3.1)
Lymphs: 37 %
MCH: 27.1 pg (ref 26.6–33.0)
MCHC: 32.2 g/dL (ref 31.5–35.7)
MCV: 84 fL (ref 79–97)
Monocytes Absolute: 0.3 10*3/uL (ref 0.1–0.9)
Monocytes: 6 %
Neutrophils Absolute: 2.9 10*3/uL (ref 1.4–7.0)
Neutrophils: 52 %
Platelets: 469 10*3/uL — ABNORMAL HIGH (ref 150–450)
RBC: 4.36 x10E6/uL (ref 3.77–5.28)
RDW: 13.2 % (ref 11.7–15.4)
WBC: 5.8 10*3/uL (ref 3.4–10.8)

## 2022-07-07 LAB — HEMOGLOBIN A1C
Est. average glucose Bld gHb Est-mCnc: 126 mg/dL
Hgb A1c MFr Bld: 6 % — ABNORMAL HIGH (ref 4.8–5.6)

## 2022-07-20 ENCOUNTER — Encounter: Payer: Self-pay | Admitting: Family Medicine

## 2022-07-24 ENCOUNTER — Telehealth (INDEPENDENT_AMBULATORY_CARE_PROVIDER_SITE_OTHER): Payer: No Typology Code available for payment source | Admitting: Family Medicine

## 2022-07-24 ENCOUNTER — Encounter: Payer: Self-pay | Admitting: Family Medicine

## 2022-07-24 VITALS — BP 117/69 | HR 79 | Temp 98.8°F

## 2022-07-24 DIAGNOSIS — J0110 Acute frontal sinusitis, unspecified: Secondary | ICD-10-CM | POA: Diagnosis not present

## 2022-07-24 DIAGNOSIS — G4709 Other insomnia: Secondary | ICD-10-CM

## 2022-07-24 MED ORDER — AZITHROMYCIN 250 MG PO TABS: ORAL_TABLET | ORAL | 0 refills | Status: AC

## 2022-07-24 NOTE — Telephone Encounter (Signed)
Spoke with patient. She states she is improving but still has a lot of phlegm in throat and congestion.  She is upset. She states she got to the appointment on time and was told the provider had left . She states this put " a bad taste in her mouth". She is at work today and could not make an appointment as we have the same hours as her work.

## 2022-07-24 NOTE — Progress Notes (Signed)
I connected with  Sara Nelson on 07/24/22 by a video enabled telemedicine application and verified that I am speaking with the correct person using two identifiers.   I discussed the limitations of evaluation and management by telemedicine. The patient expressed understanding and agreed to proceed.   Acute Office Visit  Subjective:     Patient ID: Sara Nelson, female    DOB: [birth year], 46 y.o.   MRN: [id number]  Chief Complaint  Patient presents with   Acute Visit    Virtual visit - c/o slight cough, voice hoarseness, sinus drainage, can hear herself breathing, just not feeling well,- continuing symptoms -  . (Patient states she will try to collect some vital signs before appointment time today )     HPI Patient is in today for acute visit.  Pt reports her head congestion started last week. Denies sore throat. Has some cough that started a few days ago. She did have some hoarsness, no chest pains. She also reports her right eye was matted and with drainage one day. She's been taking her vitamins daily. Works at a [employer] and co-workers/staff asked her to be seen.  Pt also having insomnia. She was given Trazodone '50mg'$  1/2-1 tab at night prn. She tried the 1/2 tab and it didn't work well. She tried taking a whole tab but this made her nauseated. She also was taking her xyzal with the Trazodone and it still didn't help.    Review of Systems  Constitutional:  Positive for malaise/fatigue.  HENT:  Positive for congestion and sinus pain.   Respiratory:  Positive for cough. Negative for shortness of breath and wheezing.   Psychiatric/Behavioral:  The patient has insomnia.   All other systems reviewed and are negative.       Objective:    BP 117/69   Pulse 79   Temp 98.8 F (37.1 C)   SpO2 98%    Physical Exam Vitals and nursing note reviewed.  Constitutional:      Appearance: Normal appearance. She is normal weight.  HENT:     Head: Normocephalic and  atraumatic.     Right Ear: External ear normal.     Left Ear: External ear normal.     Nose: Nose normal.  Eyes:     General:        Right eye: No discharge.        Left eye: No discharge.     Extraocular Movements: Extraocular movements intact.     Conjunctiva/sclera: Conjunctivae normal.  Cardiovascular:     Rate and Rhythm: Normal rate.  Pulmonary:     Effort: Pulmonary effort is normal. No respiratory distress.  Neurological:     General: No focal deficit present.     Mental Status: She is alert and oriented to person, place, and time. Mental status is at baseline.  Psychiatric:        Mood and Affect: Mood normal.        Behavior: Behavior normal.        Thought Content: Thought content normal.        Judgment: Judgment normal.    No results found for any visits on 07/24/22.      Assessment & Plan:   Problem List Items Addressed This Visit       Other   Insomnia   Other Visit Diagnoses     Acute non-recurrent frontal sinusitis    -  Primary   Relevant Medications   azithromycin (ZITHROMAX) 250  MG tablet       Meds ordered this encounter  Medications   azithromycin (ZITHROMAX) 250 MG tablet    Sig: Take 2 tablets on day 1, then 1 tablet daily on days 2 through 5    Dispense:  6 tablet    Refill:  0   Symptoms consistent with sinusitis.  Treat with Azithromycin pack for 5 days. Continue with vitamin supplementation. For insomnia, pt was unable to take 1 tab of '50mg'$  Trazodone due to nausea. She is to stay on 1/2 tab and may add Melatonin OTC with this. If no better, will likely need to stop the Trazodone and switch to a different medicine for insomnia.   No follow-ups on file.  Leeanne Rio, MD

## 2022-07-24 NOTE — Telephone Encounter (Signed)
Scheduled patient for Mychart visit for 4:10pm today.

## 2022-08-16 ENCOUNTER — Encounter: Payer: Self-pay | Admitting: Family

## 2022-08-20 ENCOUNTER — Encounter: Payer: Self-pay | Admitting: Family Medicine

## 2022-08-20 DIAGNOSIS — F411 Generalized anxiety disorder: Secondary | ICD-10-CM

## 2022-08-20 DIAGNOSIS — G4709 Other insomnia: Secondary | ICD-10-CM

## 2022-08-21 NOTE — Addendum Note (Signed)
Addended by: Leeanne Rio on: 08/21/2022 10:19 AM   Modules accepted: Orders

## 2022-08-22 ENCOUNTER — Encounter: Payer: Self-pay | Admitting: Family

## 2022-08-23 ENCOUNTER — Ambulatory Visit: Payer: Medicaid Other | Admitting: Family Medicine

## 2022-10-12 ENCOUNTER — Ambulatory Visit (INDEPENDENT_AMBULATORY_CARE_PROVIDER_SITE_OTHER): Payer: Commercial Managed Care - PPO | Admitting: Family Medicine

## 2022-10-12 ENCOUNTER — Encounter: Payer: Self-pay | Admitting: Family Medicine

## 2022-10-12 VITALS — BP 110/73 | HR 78 | Temp 98.0°F | Resp 18 | Ht 65.0 in | Wt 166.2 lb

## 2022-10-12 DIAGNOSIS — G4709 Other insomnia: Secondary | ICD-10-CM

## 2022-10-12 DIAGNOSIS — R7302 Impaired glucose tolerance (oral): Secondary | ICD-10-CM

## 2022-10-12 DIAGNOSIS — H02849 Edema of unspecified eye, unspecified eyelid: Secondary | ICD-10-CM

## 2022-10-12 LAB — POCT GLYCOSYLATED HEMOGLOBIN (HGB A1C): Hemoglobin A1C: 5.5 % (ref 4.0–5.6)

## 2022-10-12 MED ORDER — MIRTAZAPINE 7.5 MG PO TABS: 7.5000 mg | ORAL_TABLET | Freq: Every evening | ORAL | 0 refills | Status: DC | PRN

## 2022-10-12 MED ORDER — OLOPATADINE HCL 0.1 % OP SOLN: 1.0000 [drp] | Freq: Two times a day (BID) | OPHTHALMIC | 12 refills | Status: DC | PRN

## 2022-10-12 NOTE — Progress Notes (Signed)
   Established Patient Office Visit  Subjective   Patient ID: Sara Nelson, female    DOB: 21-Nov-1975  Age: 47 y.o. MRN: [id number]  Chief Complaint  Patient presents with   Follow-up    Patient states that she is her for follow up for insomnia and a repeat  HGA1C     HPI  Insomnia Pt was started on Trazodone 25-50mg . She tried the Trazodone 50 mg and it didn't make her feel well. She felt sick with it. She stopped taking it. She is on Ramadan now and fasting for 30 days from dusk to dawn. Pt was referred to Psychiatry but didn't want to see a female.  Eyelid swelling She reports her eyelids are crusting. A pimple will appear on her top eyelid. She denies pain or itching. This started a few days ago.   Prediabetes She was 6.0 3 months ago. Been working on diet. A1c today 5.5.  Review of Systems  HENT:         Eyelid swelling  All other systems reviewed and are negative.     Objective:     BP 110/73   Pulse 78   Temp 98 F (36.7 C) (Oral)   Resp 18   Ht 5\' 5"  (1.651 m)   Wt 166 lb 3.2 oz (75.4 kg)   SpO2 99%   BMI 27.66 kg/m  BP Readings from Last 3 Encounters:  10/12/22 110/73  07/24/22 117/69  07/06/22 118/84      Physical Exam   No results found for any visits on 10/12/22.  Last CBC Lab Results  Component Value Date   WBC 5.8 07/06/2022   HGB 11.8 07/06/2022   HCT 36.7 07/06/2022   MCV 84 07/06/2022   MCH 27.1 07/06/2022   RDW 13.2 07/06/2022   PLT 469 (H) [id number]   Last metabolic panel Lab Results  Component Value Date   GLUCOSE 91 09/22/2021   NA 138 09/22/2021   K 4.2 09/22/2021   CL 104 09/22/2021   CO2 27 09/22/2021   BUN 13 09/22/2021   CREATININE 0.82 09/22/2021   GFRNONAA >60 07/25/2020   CALCIUM 9.3 09/22/2021   PROT 6.9 09/22/2021   ALBUMIN 4.0 07/25/2020   LABGLOB 3.2 12/24/2019   AGRATIO 1.3 12/24/2019   BILITOT 0.6 09/22/2021   ALKPHOS 57 07/25/2020   AST 16 09/22/2021   ALT 7 09/22/2021   ANIONGAP 7 07/25/2020       The 10-year ASCVD risk score (Arnett DK, et al., 2019) is: 0.4%    Assessment & Plan:   Other insomnia -     Mirtazapine; Take 1-2 tablets (7.5-15 mg total) by mouth at bedtime as needed.  Dispense: 30 tablet; Refill: 0  Impaired glucose tolerance -     POCT glycosylated hemoglobin (Hb A1C)  Swelling of eyelid, unspecified laterality -     Olopatadine HCl; Place 1 drop into both eyes 2 (two) times daily as needed for allergies.  Dispense: 5 mL; Refill: 12   Trazodone didn't help. Will switch and try Mirtazapine for sleep. Prediabetes now normal A1c. Great work Swelling of eyelids likely allergies related. Trial of pataday bid prn. See back in 2 months for annual   No follow-ups on file.    Leeanne Rio, MD

## 2022-11-08 ENCOUNTER — Other Ambulatory Visit: Payer: Self-pay | Admitting: Family Medicine

## 2022-11-08 ENCOUNTER — Encounter: Payer: Self-pay | Admitting: Family Medicine

## 2022-11-08 DIAGNOSIS — G4709 Other insomnia: Secondary | ICD-10-CM

## 2022-11-09 ENCOUNTER — Other Ambulatory Visit: Payer: Self-pay | Admitting: Family Medicine

## 2022-11-09 DIAGNOSIS — L0292 Furuncle, unspecified: Secondary | ICD-10-CM

## 2022-11-09 MED ORDER — DOXYCYCLINE HYCLATE 100 MG PO TABS
100.0000 mg | ORAL_TABLET | Freq: Two times a day (BID) | ORAL | 0 refills | Status: AC
Start: 2022-11-09 — End: 2022-11-16

## 2022-12-03 ENCOUNTER — Encounter: Payer: Self-pay | Admitting: Family

## 2022-12-10 ENCOUNTER — Ambulatory Visit (INDEPENDENT_AMBULATORY_CARE_PROVIDER_SITE_OTHER): Payer: Commercial Managed Care - PPO | Admitting: Family Medicine

## 2022-12-10 ENCOUNTER — Encounter: Payer: Self-pay | Admitting: Family Medicine

## 2022-12-10 VITALS — BP 96/66 | HR 67 | Temp 98.1°F | Resp 18 | Ht 65.0 in | Wt 167.3 lb

## 2022-12-10 DIAGNOSIS — R7302 Impaired glucose tolerance (oral): Secondary | ICD-10-CM

## 2022-12-10 DIAGNOSIS — G4709 Other insomnia: Secondary | ICD-10-CM | POA: Diagnosis not present

## 2022-12-10 DIAGNOSIS — Z0000 Encounter for general adult medical examination without abnormal findings: Secondary | ICD-10-CM | POA: Diagnosis not present

## 2022-12-10 DIAGNOSIS — J3089 Other allergic rhinitis: Secondary | ICD-10-CM

## 2022-12-10 DIAGNOSIS — J302 Other seasonal allergic rhinitis: Secondary | ICD-10-CM

## 2022-12-10 DIAGNOSIS — Z1211 Encounter for screening for malignant neoplasm of colon: Secondary | ICD-10-CM

## 2022-12-10 DIAGNOSIS — Z13220 Encounter for screening for lipoid disorders: Secondary | ICD-10-CM

## 2022-12-10 DIAGNOSIS — Z124 Encounter for screening for malignant neoplasm of cervix: Secondary | ICD-10-CM

## 2022-12-10 DIAGNOSIS — H02849 Edema of unspecified eye, unspecified eyelid: Secondary | ICD-10-CM

## 2022-12-10 DIAGNOSIS — Z1231 Encounter for screening mammogram for malignant neoplasm of breast: Secondary | ICD-10-CM

## 2022-12-10 MED ORDER — LEVOCETIRIZINE DIHYDROCHLORIDE 5 MG PO TABS
5.0000 mg | ORAL_TABLET | Freq: Every evening | ORAL | 3 refills | Status: DC
Start: 2022-12-10 — End: 2023-09-25

## 2022-12-10 MED ORDER — MIRTAZAPINE 7.5 MG PO TABS
7.5000 mg | ORAL_TABLET | Freq: Every evening | ORAL | 0 refills | Status: AC | PRN
Start: 2022-12-10 — End: ?

## 2022-12-10 MED ORDER — OLOPATADINE HCL 0.1 % OP SOLN
1.0000 [drp] | Freq: Every day | OPHTHALMIC | 12 refills | Status: AC | PRN
Start: 2022-12-10 — End: ?

## 2022-12-10 NOTE — Progress Notes (Signed)
Complete physical exam  Patient: Sara Nelson   DOB: 1975/11/21   47 y.o. Female  MRN: [id number]  Subjective:    Chief Complaint  Patient presents with   Annual Exam    Patient is here for annual physical , Patient does not want pap    Sara Nelson is a 47 y.o. female who presents today for a complete physical exam. She reports consuming a  no pork, gluten free  diet. The patient does not participate in regular exercise at present. She generally feels well. She reports sleeping well. Remeron is doing well, 7.5 mg prn. Not taking it every night. She does not have additional problems to discuss today.  Pt ok to have pap today. Had hx of ASCUS pap in 2022 and never followed up on. Reports hx of abnormal pap smears in the past with needing biopsy done.  She is in need of mammogram order and colonoscopy referral. Sleeping well with Mirtazapine and feels she doesn't need BH referral anymore. She reports the last referral was out of network.  Pt has seasonal allergies. Needs refills on her Xyzal.  Most recent fall risk assessment:    07/06/2022    9:33 AM  Fall Risk   Falls in the past year? 0  Number falls in past yr: 0  Injury with Fall? 0  Risk for fall due to : No Fall Risks  Follow up Falls evaluation completed     Most recent depression screenings:    07/06/2022    9:33 AM 09/22/2021    4:20 PM  PHQ 2/9 Scores  PHQ - 2 Score 0 0  PHQ- 9 Score 6     Vision:Within last year  Patient Active Problem List   Diagnosis Date Noted   Lipoma of back 06/28/2022   Acute shoulder bursitis, right 04/09/2022   Loss of transverse plantar arch of right foot 04/09/2022   Patellofemoral arthritis of left knee 02/12/2022   Encounter for general adult medical examination with abnormal findings 09/22/2021   Chronic bilateral low back pain with bilateral sciatica 06/05/2021   IDA (iron deficiency anemia) 07/26/2020   OCD (obsessive compulsive disorder) 09/23/2015   Seasonal and  perennial allergic rhinitis 03/21/2014   Endometriosis of pelvic peritoneum 11/21/2012   Insomnia 05/27/2012   Sarcoid (HCC) 02/01/2012   Past Medical History:  Diagnosis Date   Endometriosis    Fibroid    History of COVID-19    Hyperlipidemia    Iron deficiency anemia    per pt   Sarcoid    Past Surgical History:  Procedure Laterality Date   CESAREAN SECTION     IR RADIOLOGIST EVAL & MGMT  11/21/2021   LAPAROTOMY  2003   missing one tube and ovary? Endometriosis   NECK SURGERY  01/24/2012   removed lymph node   TUBAL LIGATION     Social History   Socioeconomic History   Marital status: Single    Spouse name: Not on file   Number of children: 2   Years of education: Not on file   Highest education level: Not on file  Occupational History   Occupation: Sales executive  Tobacco Use   Smoking status: Never   Smokeless tobacco: Never  Vaping Use   Vaping Use: Never used  Substance and Sexual Activity   Alcohol use: Never   Drug use: Never   Sexual activity: Not Currently    Partners: Male    Birth control/protection: None, Abstinence  Other Topics Concern  Not on file  Social History Narrative   Pt reports mother raised as christian and father Erroll Luna witness.    She is ok with vaccine if it makes sense.   Never had the flu vaccine, her children never had flu vaccine.   She is Covid vaccine exempt at work. She works at [employer] in [place].    She has a cat Nipsey because she was a nibbler.    Social Determinants of Health   Financial Resource Strain: Not on file  Food Insecurity: Not on file  Transportation Needs: Not on file  Physical Activity: Not on file  Stress: Not on file  Social Connections: Not on file  Intimate Partner Violence: Not on file   Family History  Problem Relation Age of Onset   Asthma Father        childhood-smoker   Asthma Sister    Asthma Brother    Breast cancer Maternal Aunt    Diabetes Maternal Grandmother     Hypertension Maternal Grandmother    Diabetes Paternal Grandmother    Endometriosis Cousin    Cancer Cousin    Allergies  Allergen Reactions   Ceftin [Cefuroxime Axetil] Shortness Of Breath   Latex Rash and Other (See Comments)    Redish skin      Patient Care Team: Suzan Slick, MD as PCP - General (Family Medicine)   Outpatient Medications Prior to Visit  Medication Sig   Ascorbic Acid (VITAMIN C PO) Take by mouth.   Ferrous Sulfate (IRON) 325 (65 Fe) MG TABS Take 2 tablets by mouth daily.   levocetirizine (XYZAL) 5 MG tablet Take 1 tablet (5 mg total) by mouth every evening. (Patient not taking: Reported on 10/12/2022)   mirtazapine (REMERON) 7.5 MG tablet TAKE 1-2 TABLETS (7.5-15 MG TOTAL) BY MOUTH AT BEDTIME AS NEEDED.   olopatadine (PATADAY) 0.1 % ophthalmic solution Place 1 drop into both eyes 2 (two) times daily as needed for allergies.   SODIUM FLUORIDE 5000 PPM 1.1 % PSTE Take by mouth. (Patient not taking: Reported on 10/12/2022)   UNABLE TO FIND Med Name: pt reports taking black seed oil once per day (Patient not taking: Reported on 10/12/2022)   VITAMIN D, CHOLECALCIFEROL, PO Take by mouth. (Patient not taking: Reported on 10/12/2022)   No facility-administered medications prior to visit.    Review of Systems  All other systems reviewed and are negative.         Objective:     There were no vitals taken for this visit. BP Readings from Last 3 Encounters:  12/10/22 96/66  10/12/22 110/73  07/24/22 117/69      Physical Exam Vitals and nursing note reviewed. Exam conducted with a chaperone present.  Constitutional:      Appearance: Normal appearance. She is normal weight.  HENT:     Head: Normocephalic and atraumatic.     Right Ear: Tympanic membrane, ear canal and external ear normal.     Left Ear: Tympanic membrane, ear canal and external ear normal.     Nose: Nose normal.     Mouth/Throat:     Mouth: Mucous membranes are moist.     Pharynx:  Oropharynx is clear.  Eyes:     Conjunctiva/sclera: Conjunctivae normal.     Pupils: Pupils are equal, round, and reactive to light.  Cardiovascular:     Rate and Rhythm: Normal rate and regular rhythm.     Pulses: Normal pulses.     Heart sounds: Normal  heart sounds.  Pulmonary:     Effort: Pulmonary effort is normal.     Breath sounds: Normal breath sounds.  Abdominal:     General: Abdomen is flat. Bowel sounds are normal.  Genitourinary:    General: Normal vulva.  Skin:    General: Skin is warm.     Capillary Refill: Capillary refill takes less than 2 seconds.  Neurological:     General: No focal deficit present.     Mental Status: She is alert and oriented to person, place, and time. Mental status is at baseline.  Psychiatric:        Mood and Affect: Mood normal.        Behavior: Behavior normal.        Thought Content: Thought content normal.        Judgment: Judgment normal.     No results found for any visits on 12/10/22.      Assessment & Plan:    Routine Health Maintenance and Physical Exam  Immunization History  Administered Date(s) Administered   HPV 9-valent 10/28/2017    Health Maintenance  Topic Date Due   COVID-19 Vaccine (1) Never done   COLONOSCOPY (Pts 45-34yrs Insurance coverage will need to be confirmed)  Never done   HPV VACCINES (2 - 3-dose SCDM series) 07/07/2023 (Originally 11/25/2017)   INFLUENZA VACCINE  02/28/2023   PAP SMEAR-Modifier  12/30/2023   Hepatitis C Screening  Completed   HIV Screening  Completed   DTaP/Tdap/Td  Discontinued    Discussed health benefits of physical activity, and encouraged her to engage in regular exercise appropriate for her age and condition.  Problem List Items Addressed This Visit       Other   Insomnia   No follow-ups on file. Annual physical exam  Other insomnia -     Mirtazapine; Take 1 tablet (7.5 mg total) by mouth at bedtime as needed.  Dispense: 90 tablet; Refill: 0  Screening for colon  cancer -     Ambulatory referral to Gastroenterology  Impaired glucose tolerance -     CBC with Differential/Platelet -     Comprehensive metabolic panel -     Hemoglobin A1c  Screening mammogram for breast cancer -     3D Screening Mammogram, Left and Right; Future  Seasonal and perennial allergic rhinitis -     Levocetirizine Dihydrochloride; Take 1 tablet (5 mg total) by mouth every evening.  Dispense: 90 tablet; Refill: 3  Encounter for lipid screening for cardiovascular disease -     Lipid panel  Screening for cervical cancer -     IGP,CtNgTv,Apt HPV  Swelling of eyelid, unspecified laterality -     Olopatadine HCl; Place 1 drop into both eyes daily as needed for allergies.  Dispense: 5 mL; Refill: 12   Continue regimen for insomnia and Allergic rhinitis with conjunctivitis. (Mirtazapine 7.5 mg qhs prn and Pataday daily prn/Xyzal 5mg  daily) Screening labs Pap done today along with referral for colonoscopy and mammogram.      Suzan Slick, MD

## 2022-12-11 LAB — CBC WITH DIFFERENTIAL/PLATELET
Basophils Absolute: 0.1 10*3/uL (ref 0.0–0.2)
Basos: 1 %
EOS (ABSOLUTE): 0.2 10*3/uL (ref 0.0–0.4)
Eos: 4 %
Hematocrit: 35.8 % (ref 34.0–46.6)
Hemoglobin: 11.6 g/dL (ref 11.1–15.9)
Immature Grans (Abs): 0 10*3/uL (ref 0.0–0.1)
Immature Granulocytes: 0 %
Lymphocytes Absolute: 3 10*3/uL (ref 0.7–3.1)
Lymphs: 46 %
MCH: 26.4 pg — ABNORMAL LOW (ref 26.6–33.0)
MCHC: 32.4 g/dL (ref 31.5–35.7)
MCV: 81 fL (ref 79–97)
Monocytes Absolute: 0.4 10*3/uL (ref 0.1–0.9)
Monocytes: 6 %
Neutrophils Absolute: 2.8 10*3/uL (ref 1.4–7.0)
Neutrophils: 43 %
Platelets: 429 10*3/uL (ref 150–450)
RBC: 4.4 x10E6/uL (ref 3.77–5.28)
RDW: 14.6 % (ref 11.7–15.4)
WBC: 6.4 10*3/uL (ref 3.4–10.8)

## 2022-12-11 LAB — COMPREHENSIVE METABOLIC PANEL
ALT: 6 IU/L (ref 0–32)
AST: 14 IU/L (ref 0–40)
Albumin/Globulin Ratio: 1.6 (ref 1.2–2.2)
Albumin: 4.1 g/dL (ref 3.9–4.9)
Alkaline Phosphatase: 62 IU/L (ref 44–121)
BUN/Creatinine Ratio: 12 (ref 9–23)
BUN: 11 mg/dL (ref 6–24)
Bilirubin Total: 0.7 mg/dL (ref 0.0–1.2)
CO2: 21 mmol/L (ref 20–29)
Calcium: 8.7 mg/dL (ref 8.7–10.2)
Chloride: 105 mmol/L (ref 96–106)
Creatinine, Ser: 0.93 mg/dL (ref 0.57–1.00)
Globulin, Total: 2.6 g/dL (ref 1.5–4.5)
Glucose: 96 mg/dL (ref 70–99)
Potassium: 4.8 mmol/L (ref 3.5–5.2)
Sodium: 141 mmol/L (ref 134–144)
Total Protein: 6.7 g/dL (ref 6.0–8.5)
eGFR: 77 mL/min/{1.73_m2} (ref >59)

## 2022-12-11 LAB — LIPID PANEL
Chol/HDL Ratio: 3.5 ratio (ref 0.0–4.4)
Cholesterol, Total: 197 mg/dL (ref 100–199)
HDL: 56 mg/dL (ref >39)
LDL Chol Calc (NIH): 130 mg/dL — ABNORMAL HIGH (ref 0–99)
Triglycerides: 61 mg/dL (ref 0–149)
VLDL Cholesterol Cal: 11 mg/dL (ref 5–40)

## 2022-12-11 LAB — HEMOGLOBIN A1C
Est. average glucose Bld gHb Est-mCnc: 126 mg/dL
Hgb A1c MFr Bld: 6 % — ABNORMAL HIGH (ref 4.8–5.6)

## 2022-12-15 LAB — IGP,CTNGTV,APT HPV
Chlamydia, Nuc. Acid Amp: NEGATIVE
Gonococcus, Nuc. Acid Amp: NEGATIVE
HPV Aptima: NEGATIVE
PAP Smear Comment: 0
Trich vag by NAA: NEGATIVE

## 2023-01-15 ENCOUNTER — Ambulatory Visit (INDEPENDENT_AMBULATORY_CARE_PROVIDER_SITE_OTHER): Payer: No Typology Code available for payment source

## 2023-01-15 DIAGNOSIS — Z1231 Encounter for screening mammogram for malignant neoplasm of breast: Secondary | ICD-10-CM | POA: Diagnosis not present

## 2023-01-18 ENCOUNTER — Encounter: Payer: Self-pay | Admitting: Family Medicine

## 2023-01-18 ENCOUNTER — Other Ambulatory Visit: Payer: Self-pay | Admitting: Family Medicine

## 2023-01-18 ENCOUNTER — Ambulatory Visit (INDEPENDENT_AMBULATORY_CARE_PROVIDER_SITE_OTHER): Payer: Commercial Managed Care - PPO | Admitting: Family Medicine

## 2023-01-18 VITALS — BP 106/69 | HR 84 | Temp 97.9°F | Resp 18 | Ht 65.0 in | Wt 167.4 lb

## 2023-01-18 DIAGNOSIS — D171 Benign lipomatous neoplasm of skin and subcutaneous tissue of trunk: Secondary | ICD-10-CM | POA: Diagnosis not present

## 2023-01-18 DIAGNOSIS — R928 Other abnormal and inconclusive findings on diagnostic imaging of breast: Secondary | ICD-10-CM

## 2023-01-18 DIAGNOSIS — R4589 Other symptoms and signs involving emotional state: Secondary | ICD-10-CM

## 2023-01-18 NOTE — Progress Notes (Signed)
   Acute Office Visit  Subjective:     Patient ID: Sara Nelson, female    DOB: [birth year], 47 y.o.   MRN: [id number]  Chief Complaint  Patient presents with   Results    Patient is here to discuss results    HPI Patient is in today for acute visit.  Pt reports she just had a mammogram and was abnormal. She is worried about this and has repeat diagnostic mammogram on June 27th.   Pt reports she's had 3 epidurals and noted a lump to her left back. Been present for months. She is worried about this. She does worry about her health. Never returned advice to [place] about who she wants to go see.   Review of Systems  Musculoskeletal:        Lipoma of back  Psychiatric/Behavioral:  The patient is nervous/anxious.   All other systems reviewed and are negative.      Objective:    BP 106/69   Pulse 84   Temp 97.9 F (36.6 C) (Oral)   Resp 18   Ht 5\' 5"  (1.651 m)   Wt 167 lb 6.4 oz (75.9 kg)   LMP 01/06/2023   SpO2 98%   BMI 27.86 kg/m    Physical Exam Vitals and nursing note reviewed.  Constitutional:      Appearance: Normal appearance. She is normal weight.  HENT:     Head: Normocephalic and atraumatic.     Right Ear: External ear normal.     Left Ear: External ear normal.  Cardiovascular:     Rate and Rhythm: Normal rate.  Pulmonary:     Effort: Pulmonary effort is normal.  Skin:    General: Skin is warm.     Capillary Refill: Capillary refill takes less than 2 seconds.     Comments: Lipoma to left lower back  Neurological:     Mental Status: She is alert.  Psychiatric:        Mood and Affect: Mood normal.        Behavior: Behavior normal.        Thought Content: Thought content normal.        Judgment: Judgment normal.   No results found for any visits on 01/18/23.      Assessment & Plan:   Problem List Items Addressed This Visit   None  Lipoma of back  Anxiety about health   Explained to pt benign lump of the back.  Advised to follow up with  mental health provider for counseling.  No orders of the defined types were placed in this encounter.   No follow-ups on file.  Suzan Slick, MD

## 2023-01-24 ENCOUNTER — Ambulatory Visit: Payer: Commercial Managed Care - PPO

## 2023-01-24 ENCOUNTER — Ambulatory Visit
Admission: RE | Admit: 2023-01-24 | Discharge: 2023-01-24 | Disposition: A | Discharge status: Home / Self Care | Payer: Commercial Managed Care - PPO | Source: Ambulatory Visit | Attending: Family Medicine | Admitting: Family Medicine

## 2023-01-24 DIAGNOSIS — R928 Other abnormal and inconclusive findings on diagnostic imaging of breast: Secondary | ICD-10-CM

## 2023-02-04 ENCOUNTER — Ambulatory Visit: Admission: RE | Admit: 2023-02-04 | Payer: Commercial Managed Care - PPO | Source: Ambulatory Visit

## 2023-02-04 ENCOUNTER — Ambulatory Visit
Admission: RE | Admit: 2023-02-04 | Discharge: 2023-02-04 | Disposition: A | Discharge status: Home / Self Care | Payer: Commercial Managed Care - PPO | Source: Ambulatory Visit | Attending: Family Medicine | Admitting: Family Medicine

## 2023-02-04 DIAGNOSIS — R928 Other abnormal and inconclusive findings on diagnostic imaging of breast: Secondary | ICD-10-CM

## 2023-02-05 ENCOUNTER — Encounter: Payer: Self-pay | Admitting: Family Medicine

## 2023-02-11 ENCOUNTER — Other Ambulatory Visit: Payer: Self-pay | Admitting: Family Medicine

## 2023-02-11 DIAGNOSIS — N6320 Unspecified lump in the left breast, unspecified quadrant: Secondary | ICD-10-CM

## 2023-08-08 ENCOUNTER — Encounter: Payer: Self-pay | Admitting: Family

## 2023-08-15 ENCOUNTER — Encounter: Payer: Self-pay | Admitting: Family

## 2023-08-15 ENCOUNTER — Ambulatory Visit
Admission: RE | Admit: 2023-08-15 | Discharge: 2023-08-15 | Disposition: A | Discharge status: Home / Self Care | Payer: Commercial Managed Care - PPO | Source: Ambulatory Visit | Attending: Family Medicine | Admitting: Family Medicine

## 2023-08-15 ENCOUNTER — Other Ambulatory Visit: Payer: Self-pay | Admitting: Family Medicine

## 2023-08-15 DIAGNOSIS — N6320 Unspecified lump in the left breast, unspecified quadrant: Secondary | ICD-10-CM

## 2023-09-25 ENCOUNTER — Encounter: Payer: Self-pay | Admitting: Family Medicine

## 2023-09-25 ENCOUNTER — Ambulatory Visit: Payer: Commercial Managed Care - PPO | Admitting: Family Medicine

## 2023-09-25 VITALS — BP 109/71 | HR 70 | Temp 98.1°F | Resp 18 | Ht 65.0 in | Wt 166.0 lb

## 2023-09-25 DIAGNOSIS — J302 Other seasonal allergic rhinitis: Secondary | ICD-10-CM

## 2023-09-25 DIAGNOSIS — J3089 Other allergic rhinitis: Secondary | ICD-10-CM | POA: Diagnosis not present

## 2023-09-25 DIAGNOSIS — R5382 Chronic fatigue, unspecified: Secondary | ICD-10-CM

## 2023-09-25 DIAGNOSIS — N644 Mastodynia: Secondary | ICD-10-CM

## 2023-09-25 MED ORDER — LEVOCETIRIZINE DIHYDROCHLORIDE 5 MG PO TABS: 5.0000 mg | ORAL_TABLET | Freq: Every evening | ORAL | 3 refills | Status: AC

## 2023-09-25 NOTE — Progress Notes (Signed)
 Acute Office Visit  Subjective:     Patient ID: Sara Nelson, female    DOB: [birth year], 48 y.o.   MRN: [id number]  Chief Complaint  Patient presents with   Breast Mass    Patient states that about a week or two ago she found a place under her right breast, she states that the place looks like a white head and it is sore to touch.     HPI Patient is in today for ACUTE VISIT.  Breast pain Pt noticed a pimple underneath her right breast 2 weeks ago. She reports the bump was black at first and now the center is white. She reports the pain has spread across the bottom of her breast. She recently had an abnormal mammogram and went back for diagnostic mammogram with ultrasound of the left breast last month. Pt is concerned with this new bump to her breast. No drainage or redness but skin is very tender and sensitive to touch.  Fatigue Pt reports she's been extremely fatigue for the last several months. She reports getting enough sleep. She has hx of endometriosis and reports heavy menses.   Pt also reports she needs her allergy medicine refilled, Xyzal 5mg  daily.  Review of Systems  Constitutional:  Positive for malaise/fatigue.  Endo/Heme/Allergies:        Right breast pain  All other systems reviewed and are negative.      Objective:    BP 109/71   Pulse 70   Temp 98.1 F (36.7 C) (Oral)   Resp 18   Ht 5\' 5"  (1.651 m)   Wt 166 lb (75.3 kg)   SpO2 96%   BMI 27.62 kg/m  BP Readings from Last 3 Encounters:  09/25/23 109/71  01/18/23 106/69  12/10/22 96/66      Physical Exam Vitals and nursing note reviewed.  Constitutional:      Appearance: Normal appearance. She is normal weight.  HENT:     Head: Normocephalic and atraumatic.     Right Ear: External ear normal.     Left Ear: External ear normal.     Nose: Nose normal.     Mouth/Throat:     Mouth: Mucous membranes are moist.     Pharynx: Oropharynx is clear.  Eyes:     Conjunctiva/sclera: Conjunctivae  normal.     Pupils: Pupils are equal, round, and reactive to light.  Cardiovascular:     Rate and Rhythm: Normal rate.  Pulmonary:     Effort: Pulmonary effort is normal.  Genitourinary:    Comments: Skin sensitive to touch to posterior right breast. Old circular scar to area measuring 0.5 cm. No drainage. Skin:    General: Skin is warm.     Capillary Refill: Capillary refill takes less than 2 seconds.  Neurological:     General: No focal deficit present.     Mental Status: She is alert and oriented to person, place, and time. Mental status is at baseline.  Psychiatric:        Mood and Affect: Mood normal.        Behavior: Behavior normal.        Thought Content: Thought content normal.        Judgment: Judgment normal.   No results found for any visits on 09/25/23.      Assessment & Plan:   Problem List Items Addressed This Visit   None  Breast pain, right -     [place] LIMITED ULTRASOUND INCLUDING AXILLA RIGHT  BREAST; Future  Chronic fatigue -     CBC with Differential/Platelet -     TSH -     T4, free -     VITAMIN D 25 Hydroxy (Vit-D Deficiency, Fractures) -     Vitamin B12  Seasonal and perennial allergic rhinitis -     Levocetirizine Dihydrochloride; Take 1 tablet (5 mg total) by mouth every evening.  Dispense: 90 tablet; Refill: 3   Pt with old scar to right breast at [facility] clock. To send for ultrasound for evaluation of area. Reasurred pt that there was no noticeable lump outside of the area of concern resembling at pimple. For chronic fatigue, will screen iron along with metabolic conditions.  Refilled Xyzal 5mg  daily.  No orders of the defined types were placed in this encounter.   No follow-ups on file.  Suzan Slick, MD

## 2023-09-26 ENCOUNTER — Encounter: Payer: Self-pay | Admitting: Family Medicine

## 2023-09-26 ENCOUNTER — Other Ambulatory Visit: Payer: Self-pay | Admitting: Family Medicine

## 2023-09-26 DIAGNOSIS — D500 Iron deficiency anemia secondary to blood loss (chronic): Secondary | ICD-10-CM

## 2023-09-26 DIAGNOSIS — E559 Vitamin D deficiency, unspecified: Secondary | ICD-10-CM

## 2023-09-26 LAB — CBC WITH DIFFERENTIAL/PLATELET
Basophils Absolute: 0.1 10*3/uL (ref 0.0–0.2)
Basos: 1 %
EOS (ABSOLUTE): 0.3 10*3/uL (ref 0.0–0.4)
Eos: 4 %
Hematocrit: 33.1 % — ABNORMAL LOW (ref 34.0–46.6)
Hemoglobin: 10.4 g/dL — ABNORMAL LOW (ref 11.1–15.9)
Immature Grans (Abs): 0 10*3/uL (ref 0.0–0.1)
Immature Granulocytes: 0 %
Lymphocytes Absolute: 2.7 10*3/uL (ref 0.7–3.1)
Lymphs: 44 %
MCH: 24.8 pg — ABNORMAL LOW (ref 26.6–33.0)
MCHC: 31.4 g/dL — ABNORMAL LOW (ref 31.5–35.7)
MCV: 79 fL (ref 79–97)
Monocytes Absolute: 0.4 10*3/uL (ref 0.1–0.9)
Monocytes: 6 %
Neutrophils Absolute: 2.7 10*3/uL (ref 1.4–7.0)
Neutrophils: 45 %
Platelets: 512 10*3/uL — ABNORMAL HIGH (ref 150–450)
RBC: 4.19 x10E6/uL (ref 3.77–5.28)
RDW: 16 % — ABNORMAL HIGH (ref 11.7–15.4)
WBC: 6.1 10*3/uL (ref 3.4–10.8)

## 2023-09-26 LAB — VITAMIN B12: Vitamin B-12: 910 pg/mL (ref 232–1245)

## 2023-09-26 LAB — TSH: TSH: 2.28 u[IU]/mL (ref 0.450–4.500)

## 2023-09-26 LAB — VITAMIN D 25 HYDROXY (VIT D DEFICIENCY, FRACTURES): Vit D, 25-Hydroxy: 19 ng/mL — ABNORMAL LOW (ref 30.0–100.0)

## 2023-09-26 LAB — T4, FREE: Free T4: 1.26 ng/dL (ref 0.82–1.77)

## 2023-09-26 MED ORDER — VITAMIN D (ERGOCALCIFEROL) 1.25 MG (50000 UNIT) PO CAPS: 50000.0000 [IU] | ORAL_CAPSULE | ORAL | 3 refills | Status: DC

## 2023-09-26 MED ORDER — FERROUS SULFATE 325 (65 FE) MG PO TBEC: 325.0000 mg | DELAYED_RELEASE_TABLET | Freq: Every day | ORAL | 1 refills | Status: AC

## 2023-12-13 ENCOUNTER — Encounter: Payer: Commercial Managed Care - PPO | Admitting: Family Medicine

## 2024-01-18 ENCOUNTER — Other Ambulatory Visit: Payer: Self-pay | Admitting: Family Medicine

## 2024-01-18 DIAGNOSIS — E559 Vitamin D deficiency, unspecified: Secondary | ICD-10-CM

## 2024-03-17 ENCOUNTER — Encounter: Payer: Self-pay | Admitting: Family
